# Patient Record
Sex: Male | Born: 1940
Health system: Southern US, Community
[De-identification: ages and names within clinical notes are randomized; demographics above are authoritative.]

## PROBLEM LIST (undated history)

## (undated) DIAGNOSIS — N189 Chronic kidney disease, unspecified: Secondary | ICD-10-CM

## (undated) DIAGNOSIS — E785 Hyperlipidemia, unspecified: Secondary | ICD-10-CM

## (undated) DIAGNOSIS — C61 Malignant neoplasm of prostate: Secondary | ICD-10-CM

## (undated) DIAGNOSIS — E119 Type 2 diabetes mellitus without complications: Secondary | ICD-10-CM

## (undated) DIAGNOSIS — M109 Gout, unspecified: Secondary | ICD-10-CM

## (undated) DIAGNOSIS — I10 Essential (primary) hypertension: Secondary | ICD-10-CM

## (undated) DIAGNOSIS — I2510 Atherosclerotic heart disease of native coronary artery without angina pectoris: Secondary | ICD-10-CM

## (undated) HISTORY — DX: Atherosclerotic heart disease of native coronary artery without angina pectoris: I25.10

## (undated) HISTORY — DX: Chronic kidney disease, unspecified: N18.9

## (undated) HISTORY — DX: Gout, unspecified: M10.9

## (undated) HISTORY — PX: TRANSPERINEAL IMPLANT OF RADIATION SEEDS W/ ULTRASOUND: SUR1381

## (undated) HISTORY — DX: Hyperlipidemia, unspecified: E78.5

## (undated) HISTORY — DX: Essential (primary) hypertension: I10

## (undated) HISTORY — DX: Type 2 diabetes mellitus without complications: E11.9

---

## 2001-02-28 ENCOUNTER — Ambulatory Visit (HOSPITAL_COMMUNITY): Admission: RE | Admit: 2001-02-28 | Discharge: 2001-02-28 | Payer: Self-pay | Admitting: Family Medicine

## 2001-02-28 ENCOUNTER — Encounter: Payer: Self-pay | Admitting: Family Medicine

## 2001-04-06 ENCOUNTER — Encounter: Payer: Self-pay | Admitting: Neurology

## 2001-04-06 ENCOUNTER — Encounter: Admission: RE | Admit: 2001-04-06 | Discharge: 2001-04-06 | Payer: Self-pay | Admitting: Neurology

## 2002-07-05 ENCOUNTER — Ambulatory Visit (HOSPITAL_COMMUNITY): Admission: RE | Admit: 2002-07-05 | Discharge: 2002-07-05 | Payer: Self-pay | Admitting: Family Medicine

## 2002-08-21 ENCOUNTER — Encounter: Payer: Self-pay | Admitting: Urology

## 2002-08-21 ENCOUNTER — Encounter: Admission: RE | Admit: 2002-08-21 | Discharge: 2002-08-21 | Payer: Self-pay | Admitting: Urology

## 2003-10-23 HISTORY — PX: CARDIOVASCULAR STRESS TEST: SHX262

## 2003-12-26 ENCOUNTER — Ambulatory Visit (HOSPITAL_COMMUNITY): Admission: RE | Admit: 2003-12-26 | Discharge: 2003-12-26 | Payer: Self-pay | Admitting: Gastroenterology

## 2004-01-09 ENCOUNTER — Ambulatory Visit (HOSPITAL_COMMUNITY): Admission: RE | Admit: 2004-01-09 | Discharge: 2004-01-09 | Payer: Self-pay | Admitting: Gastroenterology

## 2004-01-14 ENCOUNTER — Encounter: Admission: RE | Admit: 2004-01-14 | Discharge: 2004-01-14 | Payer: Self-pay | Admitting: Cardiology

## 2004-01-16 ENCOUNTER — Ambulatory Visit (HOSPITAL_COMMUNITY): Admission: RE | Admit: 2004-01-16 | Discharge: 2004-01-16 | Payer: Self-pay | Admitting: Cardiology

## 2004-01-16 HISTORY — PX: CARDIAC CATHETERIZATION: SHX172

## 2004-02-05 ENCOUNTER — Inpatient Hospital Stay (HOSPITAL_COMMUNITY): Admission: RE | Admit: 2004-02-05 | Discharge: 2004-02-10 | Payer: Self-pay | Admitting: Surgery

## 2004-02-05 HISTORY — PX: CORONARY ARTERY BYPASS GRAFT: SHX141

## 2004-03-02 ENCOUNTER — Encounter: Admission: RE | Admit: 2004-03-02 | Discharge: 2004-03-02 | Payer: Self-pay | Admitting: Surgery

## 2004-03-08 ENCOUNTER — Encounter (HOSPITAL_COMMUNITY): Admission: RE | Admit: 2004-03-08 | Discharge: 2004-04-16 | Payer: Self-pay | Admitting: Cardiology

## 2004-03-15 ENCOUNTER — Encounter: Admission: RE | Admit: 2004-03-15 | Discharge: 2004-06-13 | Payer: Self-pay | Admitting: Surgery

## 2004-04-28 ENCOUNTER — Encounter (HOSPITAL_COMMUNITY): Admission: RE | Admit: 2004-04-28 | Discharge: 2004-07-27 | Payer: Self-pay | Admitting: Cardiology

## 2004-08-09 HISTORY — PX: CARDIOVASCULAR STRESS TEST: SHX262

## 2004-08-24 ENCOUNTER — Encounter (HOSPITAL_COMMUNITY): Admission: RE | Admit: 2004-08-24 | Discharge: 2004-11-22 | Payer: Self-pay | Admitting: Cardiology

## 2004-11-23 ENCOUNTER — Encounter (HOSPITAL_COMMUNITY): Admission: RE | Admit: 2004-11-23 | Discharge: 2005-02-21 | Payer: Self-pay | Admitting: Cardiology

## 2005-02-22 ENCOUNTER — Encounter (HOSPITAL_COMMUNITY): Admission: RE | Admit: 2005-02-22 | Discharge: 2005-05-23 | Payer: Self-pay | Admitting: Cardiology

## 2005-08-08 IMAGING — CR DG CHEST 2V
2 series · 2 of 2 positions shown · non-contrast
Comparison: none

CLINICAL DATA: Hypertension.  Pre-cardiac cath.
 TWO VIEW CHEST   
 Two view chest with no priors.
 Heart size and contour are within normal limits.  Slight peribronchial thickening.  No congestive heart failure or active disease.
 IMPRESSION
 No active disease.

[view not recorded (1 of 2)]
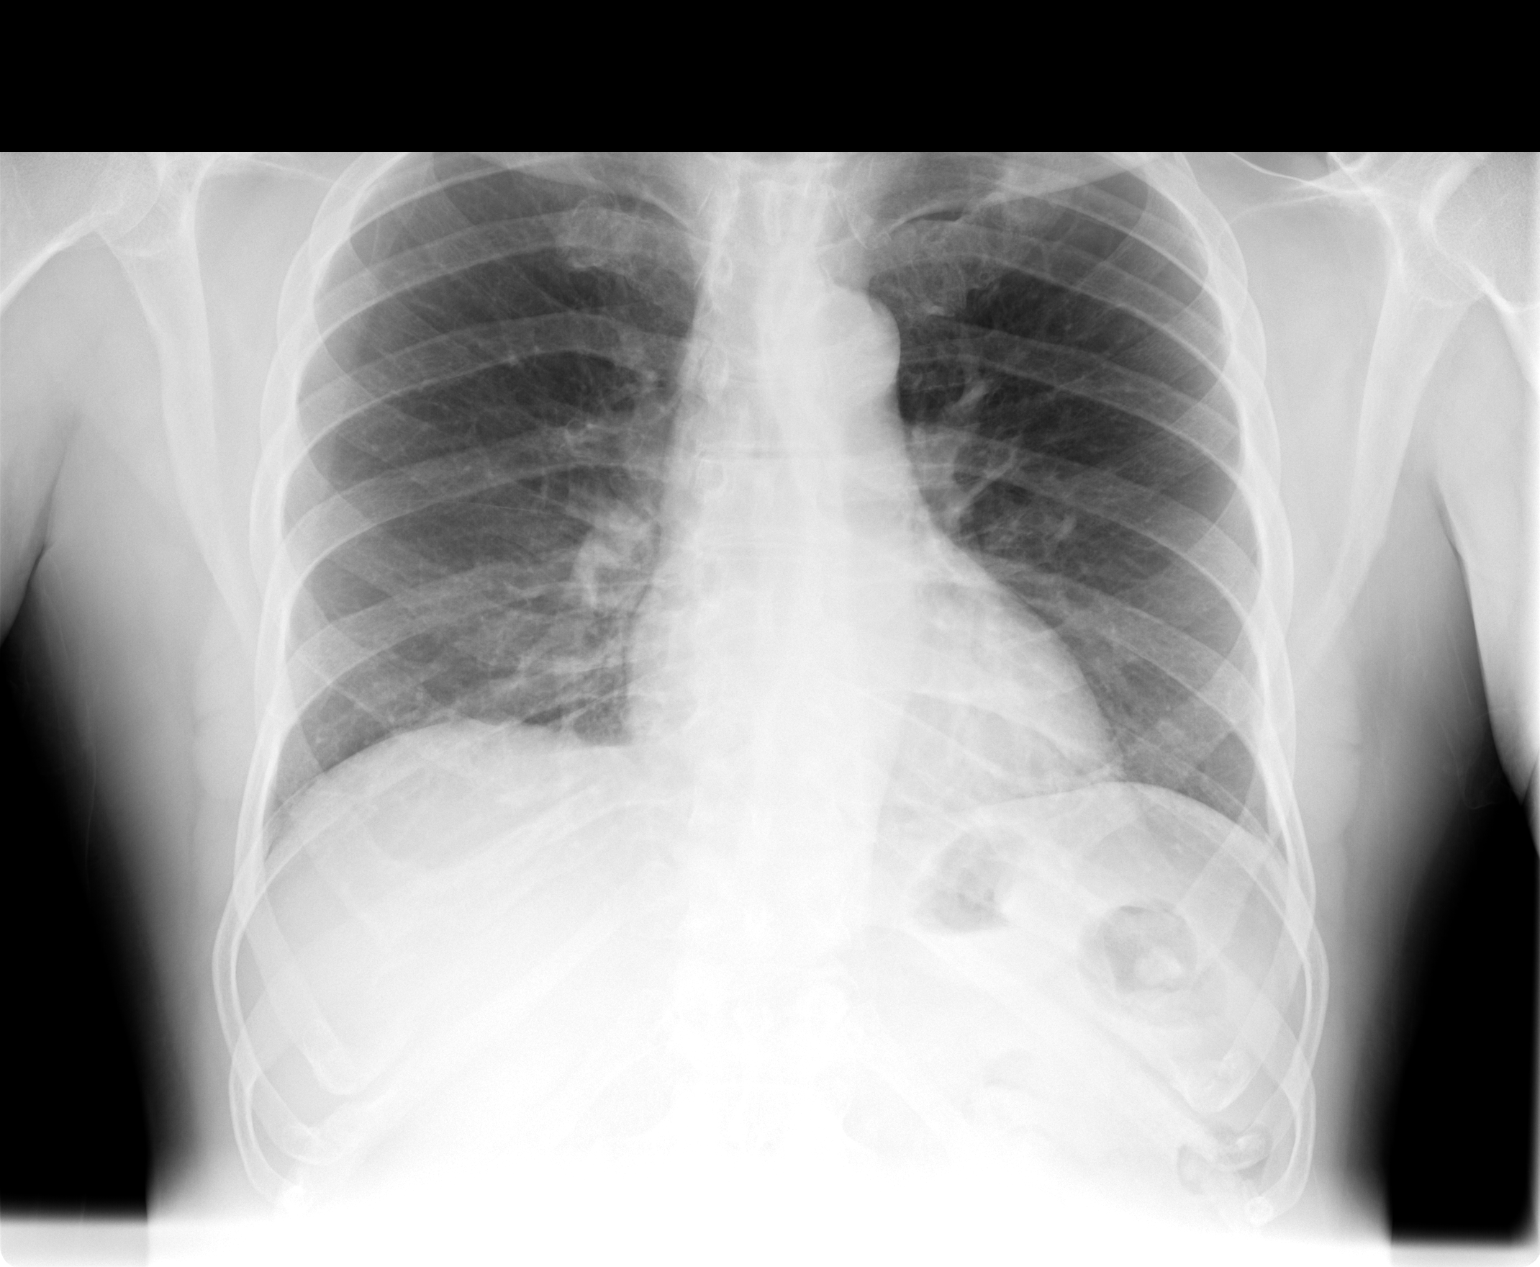

[view not recorded (2 of 2)]
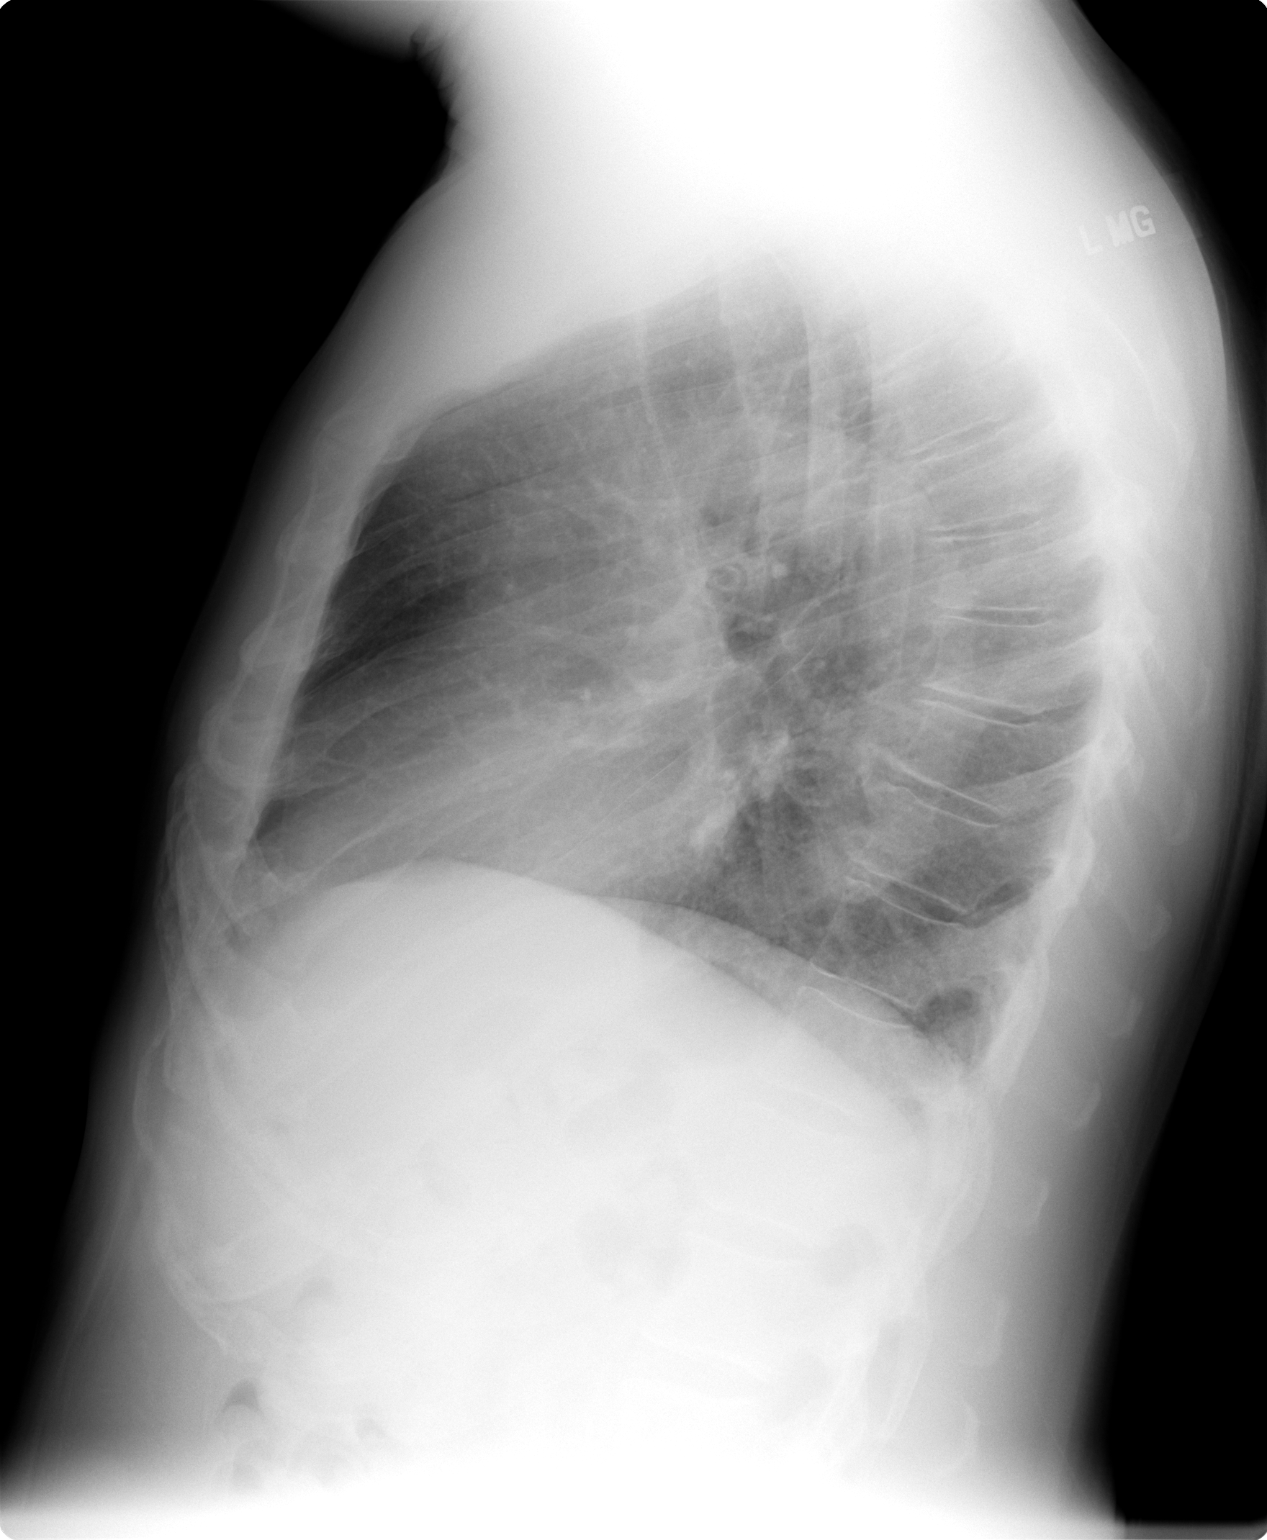

[2 of 2 positions shown; findings below may reference images not displayed]

## 2005-09-01 IMAGING — CR DG CHEST 1V PORT
1 series · 1 of 1 positions shown · non-contrast
Comparison: none

CLINICAL DATA: 63-year-old male.  Coronary artery disease status post CABG.
 PORTABLE CHEST, SINGLE VIEW
 Comparison 02/06/04.
 Portable chest radiograph 02/07/04 1980 hours.
 The Swan-Ganz catheter has been removed.  Right IJ introducer sheath remains.  Mediastinal drain and left chest tube have also been removed.  Epicardial pacing wires remain.  The patient has had a median sternotomy.  There is slight increasing bibasilar atelectasis and decreased lung volumes.  No pneumothorax.
 IMPRESSION 
 Lower lung volumes with increasing basilar atelectasis.

[view not recorded]
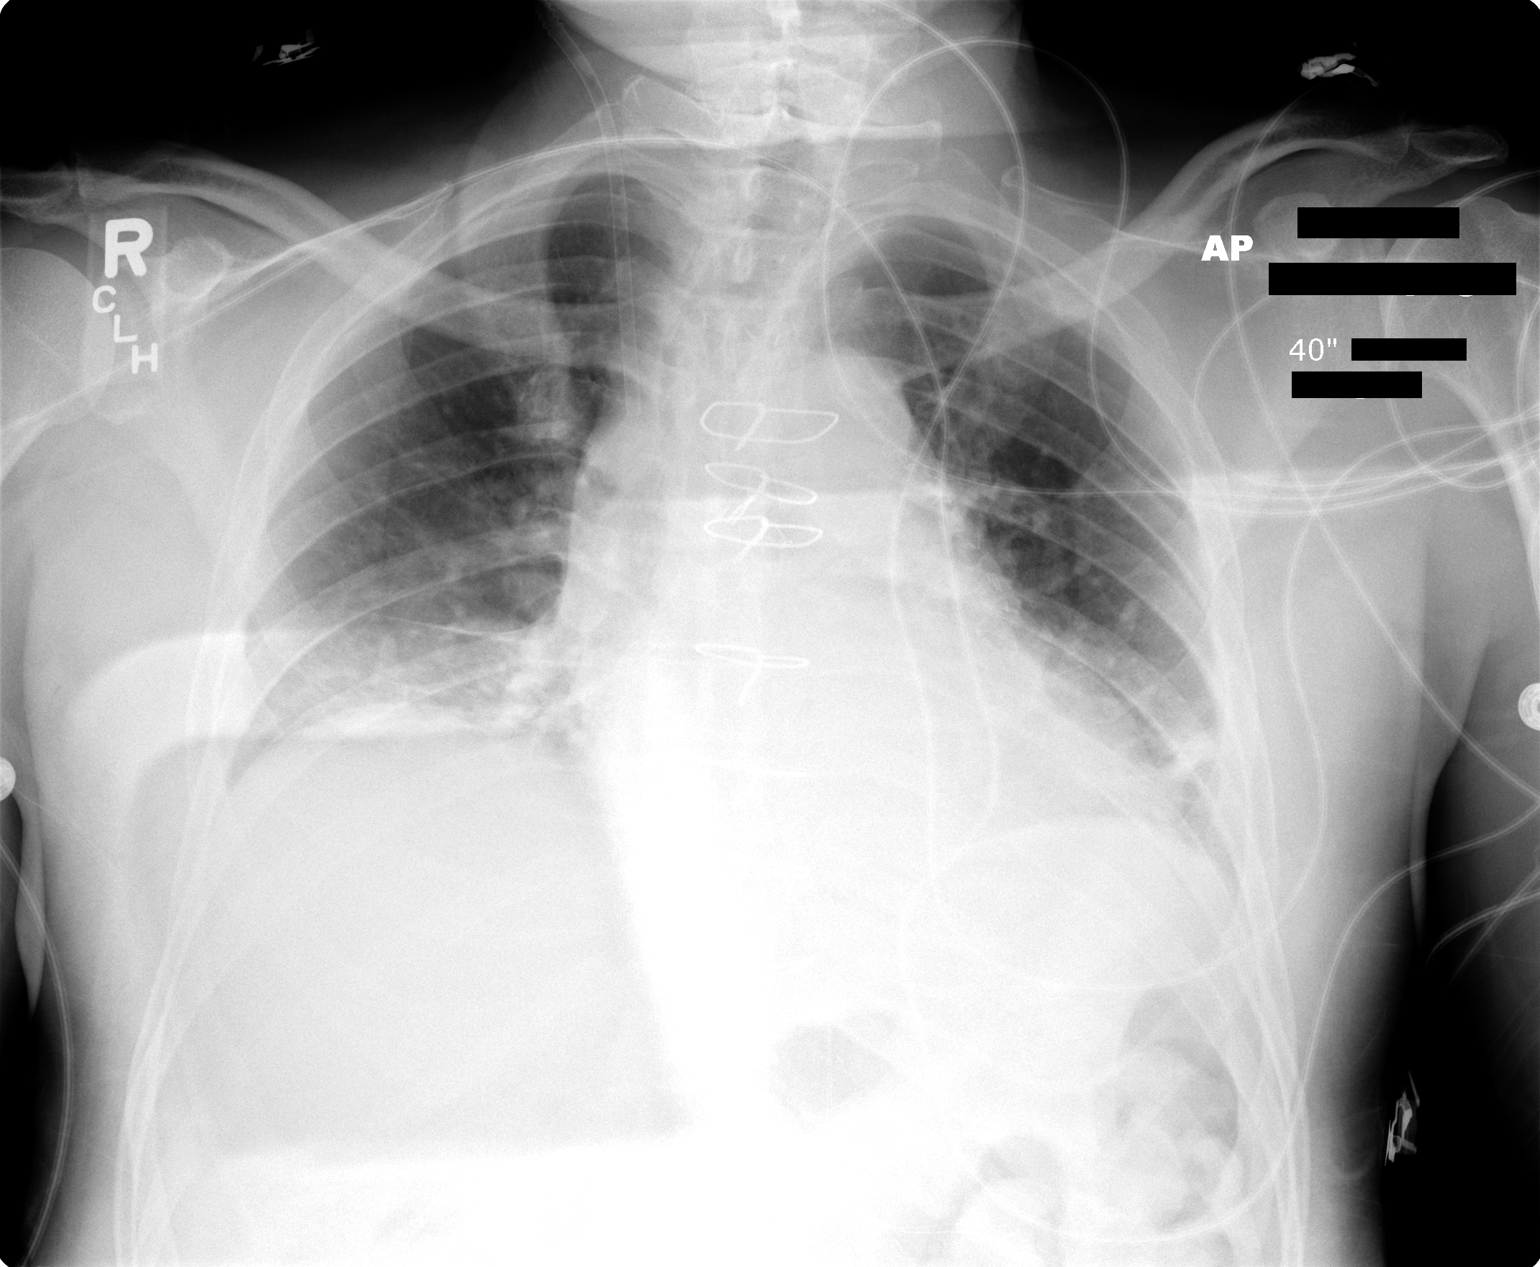

[1 of 1 positions shown; findings below may reference images not displayed]

## 2009-08-05 HISTORY — PX: NM MYOCAR PERF WALL MOTION: HXRAD629

## 2010-02-04 ENCOUNTER — Ambulatory Visit
Admission: RE | Admit: 2010-02-04 | Discharge: 2010-05-05 | Payer: Self-pay | Source: Home / Self Care | Admitting: Radiation Oncology

## 2010-02-26 ENCOUNTER — Encounter: Admission: RE | Admit: 2010-02-26 | Discharge: 2010-02-26 | Payer: Self-pay | Admitting: Urology

## 2010-03-26 ENCOUNTER — Ambulatory Visit (HOSPITAL_BASED_OUTPATIENT_CLINIC_OR_DEPARTMENT_OTHER): Admission: RE | Admit: 2010-03-26 | Discharge: 2010-03-26 | Payer: Self-pay | Admitting: Urology

## 2010-06-02 ENCOUNTER — Ambulatory Visit: Admission: RE | Admit: 2010-06-02 | Discharge: 2010-06-11 | Payer: Self-pay | Admitting: Radiation Oncology

## 2011-02-08 LAB — CBC
HCT: 39.4 % (ref 39.0–52.0)
Hemoglobin: 13 g/dL (ref 13.0–17.0)
MCHC: 32.9 g/dL (ref 30.0–36.0)
MCV: 98.3 fL (ref 78.0–100.0)
Platelets: 186 10*3/uL (ref 150–400)
RBC: 4.01 MIL/uL — ABNORMAL LOW (ref 4.22–5.81)
RDW: 13.6 % (ref 11.5–15.5)
WBC: 4.5 10*3/uL (ref 4.0–10.5)

## 2011-02-08 LAB — COMPREHENSIVE METABOLIC PANEL
ALT: 20 U/L (ref 0–53)
AST: 21 U/L (ref 0–37)
Albumin: 4 g/dL (ref 3.5–5.2)
Alkaline Phosphatase: 47 U/L (ref 39–117)
BUN: 27 mg/dL — ABNORMAL HIGH (ref 6–23)
CO2: 29 mEq/L (ref 19–32)
Calcium: 9.4 mg/dL (ref 8.4–10.5)
Chloride: 108 mEq/L (ref 96–112)
Creatinine, Ser: 1.76 mg/dL — ABNORMAL HIGH (ref 0.4–1.5)
GFR calc Af Amer: 47 mL/min — ABNORMAL LOW (ref >60)
GFR calc non Af Amer: 39 mL/min — ABNORMAL LOW (ref >60)
Glucose, Bld: 166 mg/dL — ABNORMAL HIGH (ref 70–99)
Potassium: 5.1 mEq/L (ref 3.5–5.1)
Sodium: 141 mEq/L (ref 135–145)
Total Bilirubin: 0.7 mg/dL (ref 0.3–1.2)
Total Protein: 6.9 g/dL (ref 6.0–8.3)

## 2011-02-08 LAB — GLUCOSE, CAPILLARY
Glucose-Capillary: 112 mg/dL — ABNORMAL HIGH (ref 70–99)
Glucose-Capillary: 150 mg/dL — ABNORMAL HIGH (ref 70–99)

## 2011-02-08 LAB — PROTIME-INR
INR: 0.98 (ref 0.00–1.49)
Prothrombin Time: 12.9 seconds (ref 11.6–15.2)

## 2011-02-08 LAB — APTT: aPTT: 31 seconds (ref 24–37)

## 2011-04-08 NOTE — Discharge Summary (Signed)
NAMEJAYVEON, CONVEY NO.:  [id number]   MEDICAL RECORD NO.:  [id number]                   PATIENT TYPE:  INP   LOCATION:  2034                                 FACILITY:  [facility]   PHYSICIAN:  Evelene Croon, M.D.                  DATE OF BIRTH:  01/17/1941   DATE OF ADMISSION:  02/05/2004  DATE OF DISCHARGE:  02/10/2004                                 DISCHARGE SUMMARY   PHYSICIANS:  1. Primary care, Robyn N. Allyne Gee, M.D.  2. Cardiologist, Madaline Savage, M.D.   ADMISSION DIAGNOSIS:  Severe three-vessel coronary artery disease.   DISCHARGE DIAGNOSES AND SECONDARY DIAGNOSES:  1. Three-vessel coronary artery disease status post coronary artery bypass     grafting.  2. Diabetes mellitus, type 2.  Recent hemoglobin A1C was about 10.  3. History of renal insufficiency with creatinine of 1.9, stable throughout     hospitalization.  4. History of degenerative arthritis of the spine with pain in his legs.  5. Postoperative hypotension, controlled on Neo-Synephrine, now weaned.     Initiation of postoperative beta blocker held secondary to hypotension.   PROCEDURES:  On February 05, 2004, Mr. Aloia underwent median sternotomy,  extracorporeal circulation, coronary artery bypass grafting surgery x 3  using a left internal mammary artery graft to the left anterior descending  coronary artery with a saphenous vein graft to the diagonal branch of the  left anterior descending  and a saphenous vein graft to the obtuse marginal  branch of the left circumflex coronary artery, and endoscopic vein  harvesting from the left thigh.  Surgeon was Dr. Evelene Croon.   ALLERGIES:  No known drug allergies.   BRIEF HISTORY:  Mr. Manninen is a 70 year old African-American male with a  history of smoking, hypercholesterolemia, and diabetes, who presented with  symptoms of exertional fatigue.  Cardiac catheterization showed about 40 to  50% ostial left main stenosis.  The LAD  was occluded at its origin.  It  filled from collaterals from a large conus branch of the right coronary  artery.  The right coronary artery was a nondominant vessel that had no  significant stenosis.  The left circumflex gave off a large first marginal  branch that had hazy 70% proximal stenosis in it.  The remainder of the left  circumflex terminated at a small posterolateral and posterior descending  branches, none of which were of graftable size and were relatively short.  Left ventricular function was well preserved.  Based on these findings, the  patient was referred to Dr. Evelene Croon for consideration of surgical  revascularization.  After review of the angiogram and examination of the  patient, Dr. Laneta Simmers did feel that coronary artery bypass grafting surgery  was the best treatment to prevent further ischemia and infarction.  After  Dr. Laneta Simmers discussed the risks, benefits, and alternatives with Mr. Clauss,  the patient  did agree to proceed.  His surgery was subsequently scheduled  for February 05, 2004.   HOSPITAL COURSE:  On February 05, 2004, Mr. Copeman was electively admitted to  [facility] for coronary artery disease.  On that date, he did  undergo coronary artery bypass grafting surgery as discussed above.  The  patient tolerated the procedure well and was transferred from the operating  room to the surgical intensive care unit in stable condition.  Later that  evening, he was waking up but still on the ventilator.  He was in normal  sinus rhythm with atrial pacing.  His blood pressure was stable on Neo-  Synephrine.  His labs and urine output were also stable.   By the next morning, he had bene extubated neurologically intact.  He  remained hemodynamically stable and fully atrial passed on Neo-Synephrine  for postoperative vasodilation.  His chest tube output was minimal and was,  therefore, discontinued without difficulty.  His EKG showed no significant  ST changes;  however, the right bundle branch block was noted.  His lungs  were clear, and his chest x-ray was stable.   Over the next several days, Mr. Braithwaite continued to progress.  By  postoperative day #2, he wsz in sinus rhythm and no longer atrial paced.  He  remained afebrile.  His bowel and bladder functions also working  appropriately.  His incisions were clean and dry without evidence of  infection.  He was also ambulating independently with supervision.  His pain  was also controlled on oral medication.  He was saturating above 90% on room  air.  His primary issue was hypotension, and he did require Neo-Synephrine  drip through postoperative day #2.  However, once weaned, his blood pressure  did remain low/normal.  Based on this fact, postoperative beta blocker  therapy was not initiated.   By postoperative day #4, Mr. Ernsberger was transferred out of the surgical  intensive care unit onto the floor, unit 2000.  His blood pressure remained  stable at 105/55.  He remained afebrile in sinus rhythm with good oxygen  saturations.  By this time, his Amaryl had been restarted for diabetes, and  blood sugars were ranging approximately 110 to 200.  His Avandamet was held  until discharge.  The patient was seen by the diabetes educator while  hospitalized, and an outpatient followup appointment was arranged four to  six weeks after discharge with nutrition and diabetes management center.  Mr. Villalon was also restarted on his cholesterol-lowering agents.   Due to Mr. Viverette' steady progression, Dr. Laneta Simmers did feel he would be ready  for discharge on postoperative day #5, February 10, 2004.  The patient was also  followed by his cardiologist during hospitalization who also was in  agreement with these discharge plans.  Official orders will be pending  patient's status during morning rounds.  LABORATORY AND X-RAY DATA:  On February 08, 2004, sodium 136, potassium 3.7,  blood glucose 84, BUN 13, creatinine 1.5,  calcium 8l.4.  White blood count  7.9, hemoglobin 10.1, hematocrit 28.8, platelet count 133.   Most recent chest x-ray on February 08, 2004, showed improving bibasilar  atelectasis with small pleural effusions.   DISCHARGE INSTRUCTIONS:   DISCHARGE MEDICATIONS:  1. Enteric-coated aspirin 325 mg 1 p.o. daily.  2. Lipitor 20 mg 1 p.o. daily.  3. Amaryl 4 mg 1 p.o. b.i.d. with meals.  4. Avandamet 1 mg/500 mg 2 tablets t.i.d. with meals.  5. Tylox  1 to 2 tablets p.o. 4-6h. p.r.n. pain.   ACTIVITY:  He is instructed to avoid driving or heavy lifting of more than  10 pounds.  He may continue daily walking and breathing exercises.   DIET:  He is to follow low-fat, low-salt, carbohydrate-modified diet.   WOUND CARE:  He may shower daily and clean his incisions with mild soap and  water.  He is to notify the CVTS office if he develops fever or if he  develops redness or drainage at his incision sites.   FOLLOW UP:  1. He is to follow up with Dr. Laneta Simmers on March 02, 2004, at 1:30 p.m. He is     to have a chest x-ray at the [facility] that same day     at 12:30 p.m.  He is instructed to bring his chest x-ray with him to his     appointment with Dr. Laneta Simmers.  2. He is to schedule two-week followup with Dr. Elsie Lincoln.  3. He is to schedule two to four-week followup with Dr. Allyne Gee regarding     further management of diabetes.  4. Nutrition and diabetes management center will contact him in four to six     weeks to arrange for outpatient diabetes education.      Jerold Coombe, P.A.                  Evelene Croon, M.D.    AWZ/MEDQ  D:  02/09/2004  T:  02/11/2004  Job:  [id number]   cc:   Candyce Churn. Allyne Gee, M.D.  [address]  [address]  [place]  [place] [address]  Fax: [phone]   Madaline Savage, M.D.  [phone] [address], [address]  [place]  [place] [address]  Fax: [phone]

## 2011-04-08 NOTE — Cardiovascular Report (Signed)
NAMEMUHAMED, LUECKE NO.:  [id number]   MEDICAL RECORD NO.:  [id number]                   PATIENT TYPE:  OIB   LOCATION:  2853                                 FACILITY:  [facility]   PHYSICIAN:  Madaline Savage, M.D.             DATE OF BIRTH:  Aug 09, 1941   DATE OF PROCEDURE:  01/16/2004  DATE OF DISCHARGE:  01/16/2004                              CARDIAC CATHETERIZATION   PROCEDURES PERFORMED:  1. Outpatient cardiac catheterization consisting of:     A. Selective coronary angiography by Judkins technique.     B. Retrograde left heart catheterization.     C. Left ventricular angiography.     D. Selective visualization of left subclavian and internal mammary        arteries.  2. Left ventricular angiography in two views.  3. Abdominal aortography.   COMPLICATIONS:  None.   ENTRY SITE:  Right femoral.   DYE USED:  Omnipaque.   PATIENT PROFILE:  The patient is a 70 year old African-American gentleman  who is diabetic who recently had a Cardiolite stress test for assessment of  LV systolic function and anatomy in a patient with a history of tobacco use,  diabetes, hypertension and hyperlipidemia.  That stress test showed that the  ejection fraction was 43% and that there was septal ischemia extending from  base to apex.  The patient's creatinine is elevated at 1.6.  He was given  Mucomyst pre procedure.  Our dye load today was approximately 175 mL.   RESULTS:   PRESSURES:  Left ventricular pressure was 115/1, end-diastolic pressure 10.  Central aortic pressure 115/90, mean of 85.   ANGIOGRAPHIC RESULTS:  1. The left main coronary artery showed damping of catheter pressure when     entered.  In retrospect, there was noted to be a 50% stenosis in the     proximal portion of the left main which was then successfully visualized     with angiography with the repositioning of the catheter.  The mid and     distal portions of the left main coronary  artery appeared     angiographically patent.  The left anterior descending coronary artery is     thread-like throughout its proximal course.  The vessel has no antegrade     flow beyond the septal perforator branch.  2. The left circumflex is a large and dominant vessel giving rise to both     posterior descending and posterior lateral branches distally.  There is a     huge bifurcating very proximal obtuse marginal branch arising almost     where a ramus intermedius branch would originate and it contains a 75-80%     stenosis at the proximal and ostial portions of the vessel.  The distal     vessel is approaching 3.0 mm in diameter and bifurcates toward the     cardiac apex.  3. The right  coronary artery is a nondominant vessel with a very large     pulmonary conus branch and there is only trivial disease seen in the     right coronary artery.  The pulmonary conus branch has at least four     channels of collateral flow to the LAD filling it from near the origin of     the first septal perforator branch all the way to the apex and around the     apex.  After intracoronary nitroglycerin, this LAD vessel did show     improvement of luminal diameter and does appear to be graftable.  4. The left subclavian artery is patent.  5. The internal mammary artery is patent as well.  6. The abdominal aorta shows a relatively smooth contour.  Definitely, no     evidence of aneurysm formation and normal common iliacs and normal     proximal internal and external iliacs.  The renal arteries are normal.  7. Left ventricular angiography shows mild anterolateral wall ischemia with     an ejection fraction estimate of about 50% in the RAO view.  In the LAO     view, the septum moves vigorously as does the apex and the posterior     lateral wall moves well.  Ejection fraction in this view is approximately     55-65%.   FINAL DIAGNOSES:  1. No chest pain, but coronary risk factors including diabetes,      hyperlipidemia, tobacco use and hypertension.  2. Good left ventricular systolic function.  Ejection fraction somewhere     between 50 and 65% depending on the view of ventricle.  3. Multi-vessel coronary disease.     A. 40-50% left main stenosis.     B. 100% occlusion of mid left anterior descending in a fairly thread-like        vessel proximally.  4. Dominant circumflex coronary artery that is normal.  5. First obtuse marginal branch 75-80% ostial stenosis with good distal run     off.  6. Nondominant right coronary artery with trivial disease, but rich     collateralization from the pulmonary conus branch to left anterior     descending.  7. Normal renal arteries.  8. Baseline renal insufficiency, probably due to diabetic nephropathy.   PLAN:  Continue Mucomyst and IV fluid.  Day surgery or outpatient evaluation  by [facility].  Continuation of previous  outpatient medications.                                               Madaline Savage, M.D.    WHG/MEDQ  D:  01/16/2004  T:  01/17/2004  Job:  [id number]   cc:   Candyce Churn. Allyne Gee, M.D.  [address]  [address]  [place]  [place] [address]  Fax: [phone]   [facility] [place]

## 2011-04-08 NOTE — Op Note (Signed)
NAME:  Jose Ponce, Jose Ponce                          ACCOUNT NO.:  [id number]   MEDICAL RECORD NO.:  [id number]                   PATIENT TYPE:  AMB   LOCATION:  ENDO                                 FACILITY:  [facility]   PHYSICIAN:  Anselmo Rod, M.D.               DATE OF BIRTH:  [birth year]   DATE OF PROCEDURE:  12/26/2003  DATE OF DISCHARGE:                                 OPERATIVE REPORT   PROCEDURE:  Colonoscopy with snare polypectomy x 1.   ENDOSCOPIST:  Charna Elizabeth, M.D.   INSTRUMENT USED:  Olympus video colonoscope.   INDICATIONS FOR PROCEDURE:  70 year old African American male with a family  history of colon cancer in his mother undergoing screening colonoscopy to  rule out colonic polyps, masses, etc.   PREPROCEDURE PREPARATION:  Informed consent was procured from the patient.  The patient was fasted for eight hours prior to the procedure and prepped  with a bottle of magnesium citrate and a gallon of GoLYTELY the night prior  to the procedure.   PREPROCEDURE PHYSICAL:  Patient with stable vital signs.  Neck supple.  Chest clear to auscultation.  Abdomen soft with normal bowel sounds.   DESCRIPTION OF PROCEDURE:  The patient was placed in the left lateral  decubitus position, sedated with 100 mg of Demerol and 12 mg Versed  intravenously.  Once the patient was adequately sedated, maintained on low  flow oxygen and continuous cardiac monitoring, the Olympus video colonoscope  was advanced from the rectum to the proximal right colon with difficulty.  The cecal base was not visualized as there was a large amount of residual  stool in the cecum.  The patient's position was changed from the left  lateral to the supine and the right lateral position with gentle application  of abdominal pressure to facilitate entry of the scope into the right colon.  The prep was inadequate.  There was a large amount of residual stool in the  colon, therefore, visualization was not complete.  A  small sessile polyp was  snared from the distal right colon.  Internal hemorrhoids were seen on  retroflexion.  There was no evidence of diverticulosis, but as I mentioned  above, examination was not adequate.   IMPRESSION:  1. Small sessile polyp snared from proximal right colon, polyp lost in     stool.  2. Internal hemorrhoids.  3. Large amount of residual stool in the colon, small lesions could have     been missed.   RECOMMENDATIONS:  1. ECBE after 15 days.  2. Outpatient follow up after the barium enema has been done.  3. Avoid nonsteroidals for now.  4. Further recommendations made at follow up.  Anselmo Rod, M.D.    JNM/MEDQ  D:  12/26/2003  T:  12/26/2003  Job:  [id number]   cc:   Candyce Churn. Allyne Gee, M.D.  [address]  [address]  [place]  [place] [address]  Fax: [phone]

## 2011-04-08 NOTE — Op Note (Signed)
NAME:  Jose Ponce, Jose Ponce NO.:  [id number]   MEDICAL RECORD NO.:  [id number]                   PATIENT TYPE:  INP   LOCATION:  2316                                 FACILITY:  [facility]   PHYSICIAN:  Evelene Croon, M.D.                  DATE OF BIRTH:  October 05, 1941   DATE OF PROCEDURE:  02/05/2004  DATE OF DISCHARGE:                                 OPERATIVE REPORT   PREOPERATIVE DIAGNOSIS:  Left main and severe two vessel coronary artery  disease.   POSTOPERATIVE DIAGNOSIS:  Left main and severe two vessel coronary artery  disease.   PROCEDURE:  1. Median sternotomy.  2. Extracorporeal circulation.  3. Coronary artery bypass grafting surgery times three using a left internal     mammary artery graft to the left anterior descending coronary artery with     a saphenous vein graft to the diagonal branch of the left anterior     descending and a saphenous vein graft to the obtuse marginal branch of     the left circumflex coronary artery.  4. Endoscopic vein harvesting from the left thigh.   SURGEON:  Evelene Croon, M.D.   ASSISTANT:  Jerold Coombe, P.A.   ANESTHESIA:  General endotracheal anesthesia.   CLINICAL HISTORY:  The patient is a 70 year old gentleman with a history of  smoking, hypercholesterolemia and diabetes who presented with symptoms of  exertional fatigue.  Cardiac catheterization showed about a 40 to 50% ostial  left main stenosis.  The LAD was occluded at its origin.  It filled by  collaterals from a large conus branch of the right coronary artery.  The  right coronary artery was a non-dominant vessel that had no significant  stenosis.  The left circumflex gave off a large first marginal branch that  had hazy 70% proximal stenosis in it.  The remainder of the left circumflex  terminated as small posterolateral and posterior descending branches, none  of which were of graftable size and were relatively short.  Left ventricular  function was well preserved. After review of the angiogram and examination  of the patient, it was felt that coronary artery bypass grafting surgery was  the best treatment to prevent further ischemia and infarction.  I discussed  the operative procedure with the patient and his family including  alternatives, benefits and risks including bleeding, blood transfusion,  infection, stroke, myocardial infarction and death.  They understood and  agreed to proceed.   DESCRIPTION OF PROCEDURE:  The patient was taken to the operating room and  placed on the table in the supine position. After the induction of general  endotracheal anesthesia a Foley catheter was placed in the bladder using  sterile technique.  Then the chest, abdomen and both lower extremities were  prepped and draped in the usual sterile manner.  The chest was entered  through a median sternotomy incision and  the pericardium opened in the  midline.  Examination of the heart showed good ventricular contractility.  The ascending aorta had no palpable plaques in it.   Then the left internal mammary artery was harvested from the chest wall as a  pedicle graft.  This was a medium caliber vessel with excellent blood flow  through it.  At the same time, a segment of greater saphenous vein was  harvested from the left thigh using endoscopic vein harvest technique. This  vein was of medium size and good quality.  We initially examined the  saphenous vein adjacent to the right knee and this vein was smaller and not  felt to be ideal for this patient.  This vein was not harvested.   Then the patient was heparinized and when an adequate activated clotting  time was achieved, the distal ascending aorta was cannulated using a 20  French aortic cannula for arterial in flow.  Venous outflow was achieved  using a two stage venous cannula through the right atrial appendage.  An  antegrade cardioplegia and vent cannula were inserted into the  aortic root.   The patient was placed on cardiopulmonary bypass and the distal coronary was  identified.  The LAD was a large graftable vessel.  There was a diagonal  branch that was not visualizes on the angiogram which was a medium size  graftable vessel.  The first marginal branch was a large vessel that was  intramyocardial along most of its extent and was located in its mid portion  where it was a large graftable vessel.  The posterior descending artery and  posterolateral branches of the left circumflex coronary artery were all  small vessels that were not suitable for grafting.  The main distal left  circumflex coronary artery could not be visualized in the atrioventricular  groove because there was a large coronary sinus vein overlying this area.   Then the aorta was cross clamped and 500 mL of cold blood antegrade  cardioplegia was administered in the aortic root with quick arrest of the  heart.  Systemic hypothermia to 20 degrees Centigrade and topical  hypothermic with iced saline was used. A temperature probe was placed in the  septum and an insulating pad in the pericardium.   The first distal anastomosis was performed to the first marginal branch.  The internal diameter was about 3 mm.  The conduit used was a segment of  greater saphenous vein and the anastomosis performed in an end to side  manner using continuous 7-0 Prolene suture.  Flow was noted through the  graft and was excellent.   The second distal anastomosis was performed to the diagonal branch. The  internal diameter was about 1.6 mm.  The conduit used was a second segment  of greater saphenous vein and the anastomosis performed in an end to side  manner using continuous 7-0 Prolene suture.  Flow was noted through the  graft and was excellent.   Then a dose a cardioplegia was given down the main grafts and into the  aortic root.   The third distal anastomosis was performed to the mid portion of the  left anterior descending coronary artery.  The internal diameter of this vessel  was about 2 mm.  The conduit used was the left internal mammary artery graft  and this was brought through an opening in the left pericardium anterior to  the phrenic nerve.  It was anastomosed to the LAD in an end to side  manner  using continuous 8-0 Prolene suture.  The pedicle was tacked to the  epicardium with 6-0 Prolene sutures.  The patient was rewarmed to 37 degrees  Centigrade.  The clamp was removed from the mammary pedicle.  There was  rapid warming of the ventricular septum and return of spontaneous  ventricular fibrillation. The cross clamp was removed with a time of 41  minutes and the patient spontaneously converted to sinus rhythm.   A partial occlusion clamp was placed on the aortic root and the two proximal  vein graft anastomoses were performed in an end to side manner using  continuous 6-0 Prolene suture.  The clamp was removed, the vein grafts were  de-aired in routine manner.  The proximal and distal anastomoses appeared  hemostatic and lie in the grafts satisfactorily. A graft marker was placed  around the proximal anastomoses.  Two temporary right ventricular and right  atrial pacing wires were placed and brought out through the skin.   When the patient had rewarmed to 37 degrees Centigrade, he was weaned from  cardiopulmonary bypass on no inotropic agents.  Total bypass time was 74  minutes.  Cardiac function appeared excellent with a cardiac output of 5 to  6 liters per minute. Protamine was given and the venous and aortic cannulas  were removed without difficulty.  Hemostasis was achieved.  Three chest  tubes were placed, one to the tube in the posterior pericardium, one in the  left pleural space and one in the anterior mediastinum.  The pericardium was  loosely reapproximated over the heart.  The sternum was closed with #6  stainless steel wires.  The fascia was closed with  continuous #1 Vicryl  suture.  The subcutaneous tissue was closed with continuous 2-0 Vicryl and  the skin with 3-0 Vicryl and subcuticular closure.  The lower extremity vein  harvest site was closed in layers in a similar manner.  The sponge, needle  and instrument counts were correct.  Dry sterile dressings were applied over  the incisions and around the chest tubes which were hooked to PleuraVac  suction.  The patient remained hemodynamically stable and was transferred to  the SICU in guarded but stable condition.                                               Evelene Croon, M.D.    BB/MEDQ  D:  02/05/2004  T:  02/08/2004  Job:  [id number]   cc:   Madaline Savage, M.D.  [address]  [place]  [place] [address]  Fax: [phone]

## 2011-06-07 HISTORY — PX: NM MYOCAR PERF WALL MOTION: HXRAD629

## 2011-09-21 IMAGING — CR DG CHEST 2V
2 series · 2 of 2 positions shown · non-contrast
Comparison: 03/02/2004

CLINICAL DATA: Preop evaluation for prostate cancer

CHEST - 2 VIEW

[view not recorded (1 of 2)]
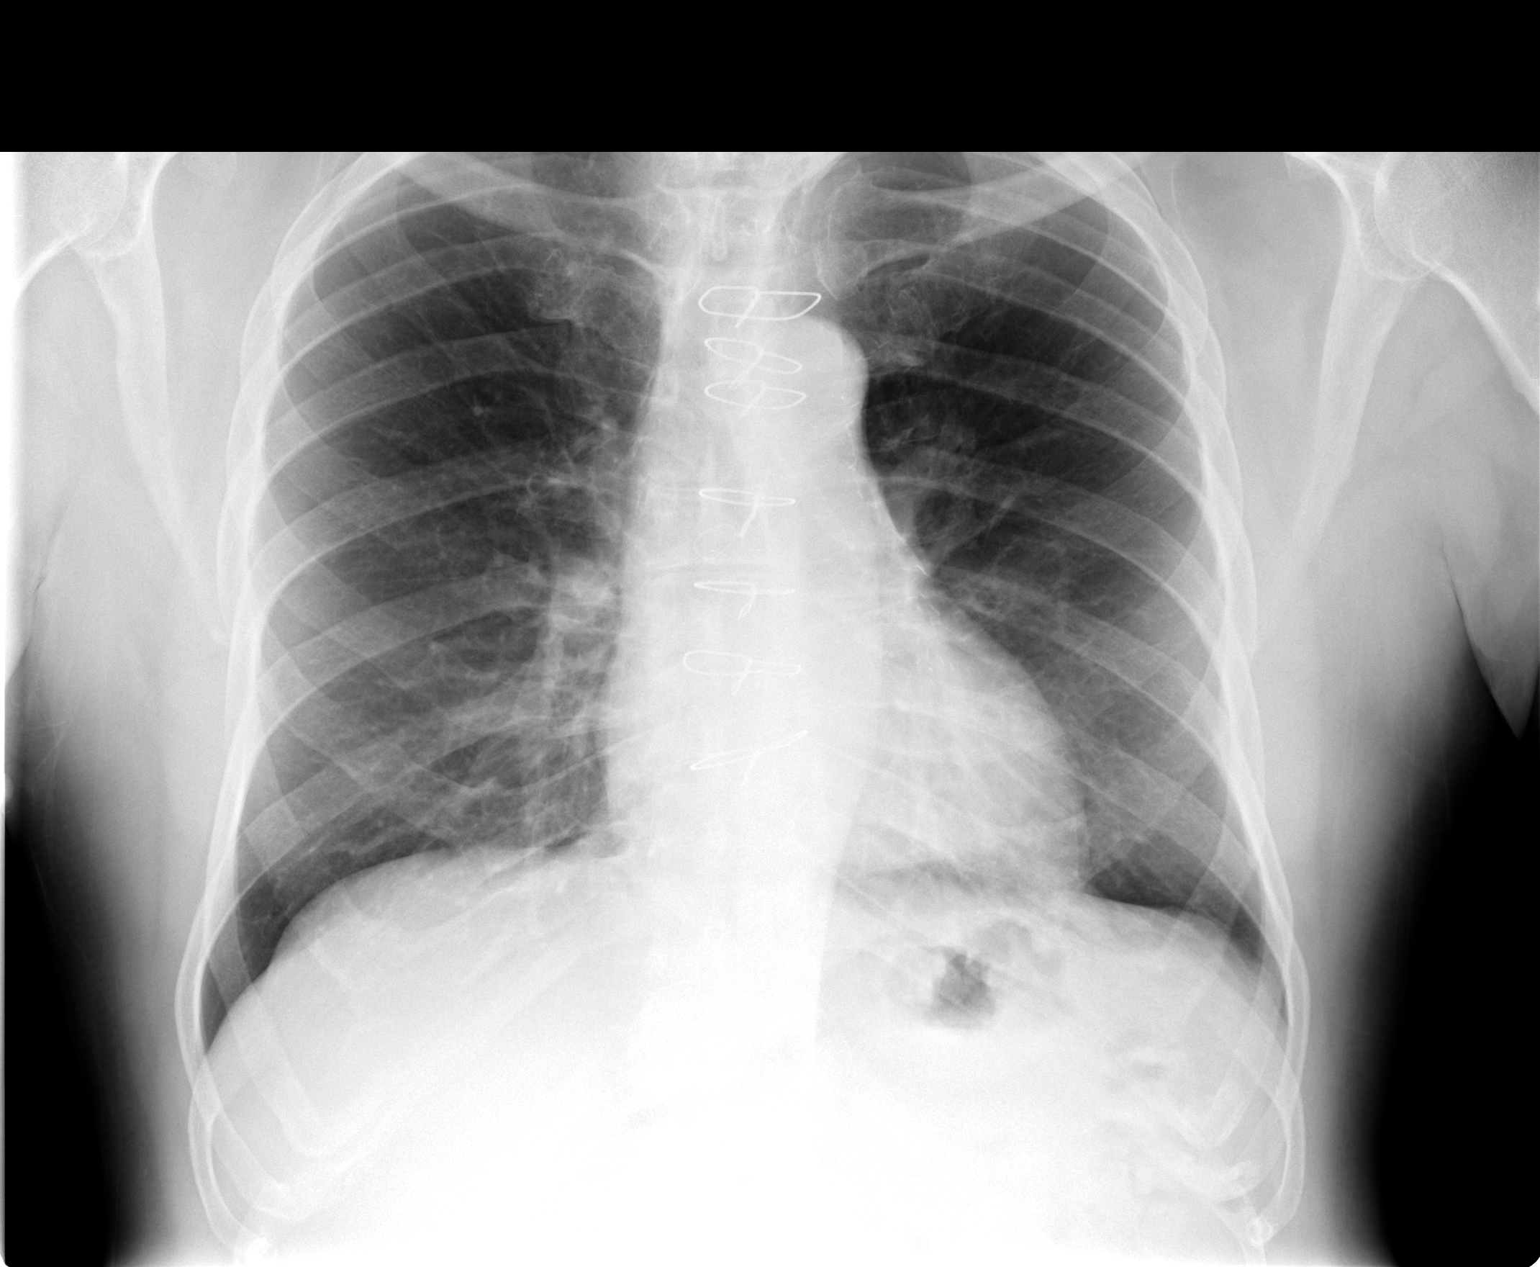

[view not recorded (2 of 2)]
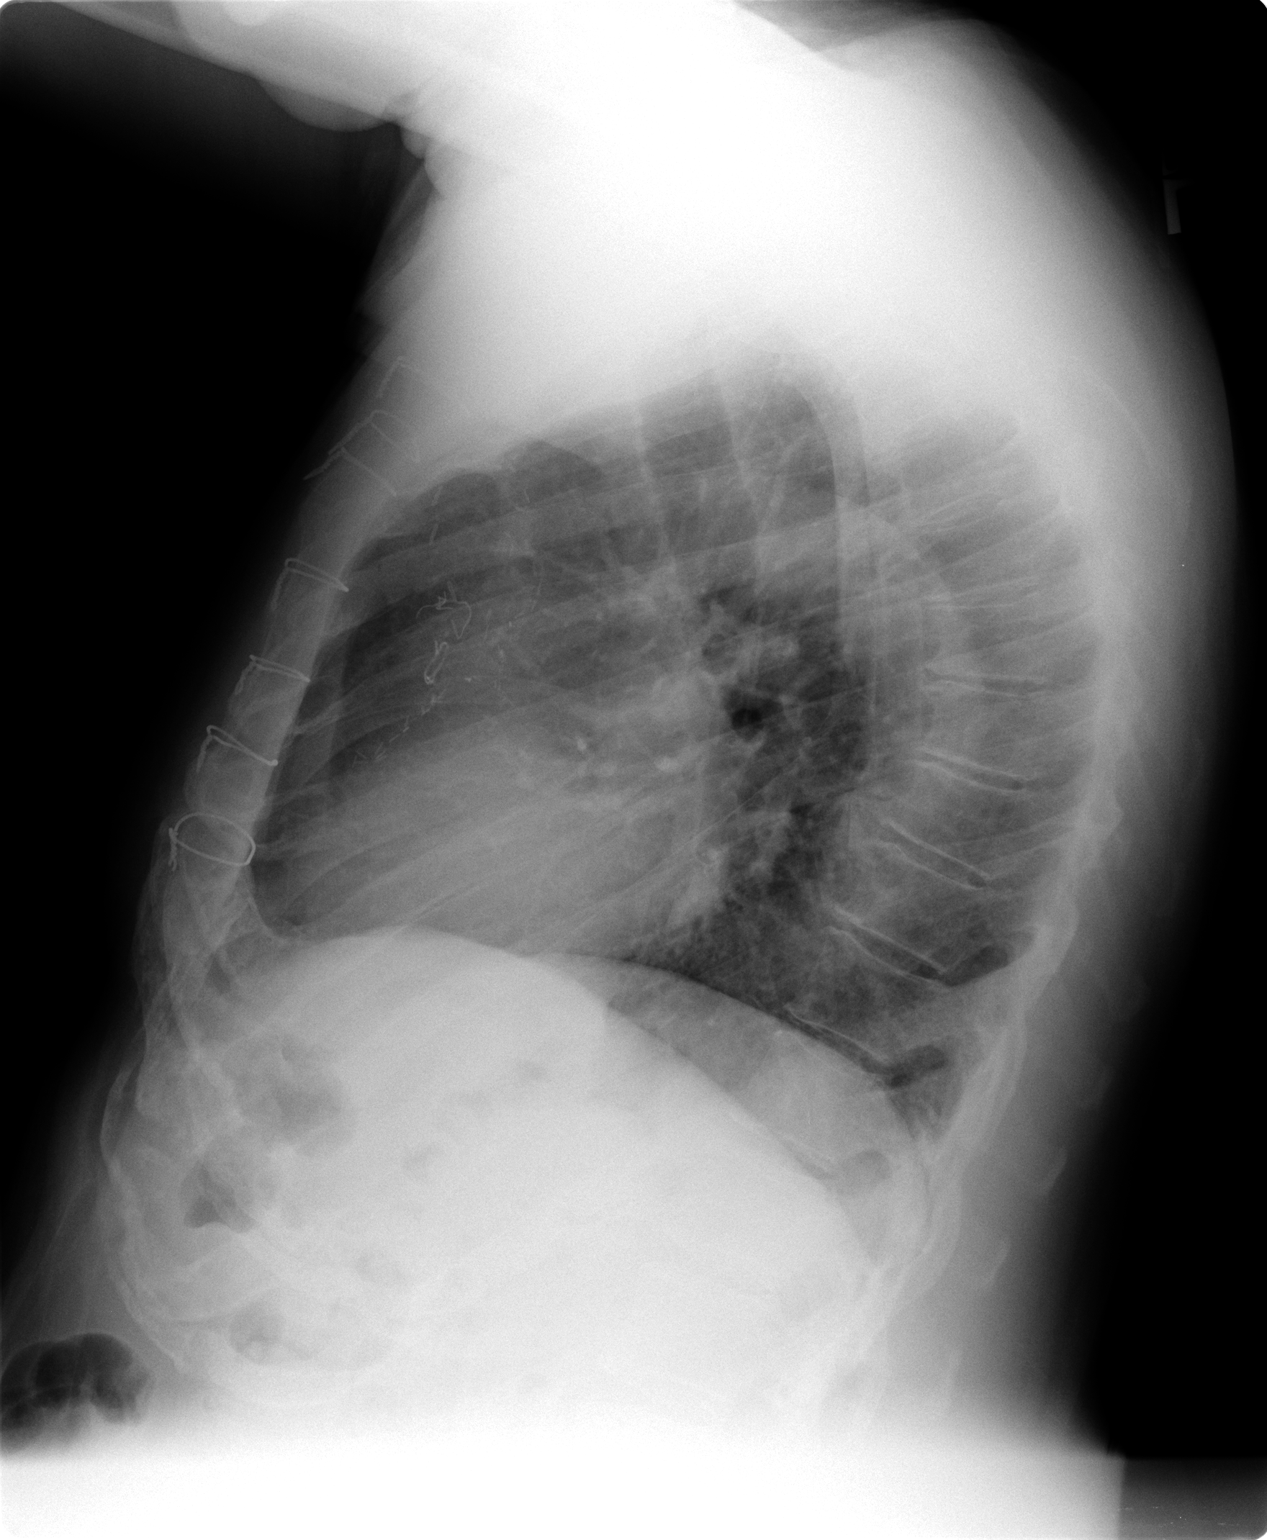

[2 of 2 positions shown; findings below may reference images not displayed]

FINDINGS: The lungs are clear without focal infiltrate, edema,
pneumothorax or pleural effusion. The cardiopericardial silhouette
is within normal limits for size. Imaged bony structures of the
thorax are intact. The patient is status post CABG.
IMPRESSION: Stable.  No acute findings.

## 2011-10-20 HISTORY — PX: OTHER SURGICAL HISTORY: SHX169

## 2014-01-30 ENCOUNTER — Encounter: Payer: Self-pay | Admitting: Cardiovascular Disease

## 2014-01-30 ENCOUNTER — Ambulatory Visit (INDEPENDENT_AMBULATORY_CARE_PROVIDER_SITE_OTHER): Payer: Medicare PPO | Admitting: Cardiovascular Disease

## 2014-01-30 VITALS — BP 112/80 | HR 50 | Ht 71.0 in | Wt 180.1 lb

## 2014-01-30 DIAGNOSIS — E1122 Type 2 diabetes mellitus with diabetic chronic kidney disease: Secondary | ICD-10-CM | POA: Insufficient documentation

## 2014-01-30 DIAGNOSIS — I10 Essential (primary) hypertension: Secondary | ICD-10-CM

## 2014-01-30 DIAGNOSIS — E119 Type 2 diabetes mellitus without complications: Secondary | ICD-10-CM

## 2014-01-30 DIAGNOSIS — I2510 Atherosclerotic heart disease of native coronary artery without angina pectoris: Secondary | ICD-10-CM

## 2014-01-30 DIAGNOSIS — E785 Hyperlipidemia, unspecified: Secondary | ICD-10-CM

## 2014-01-30 DIAGNOSIS — N1831 Chronic kidney disease, stage 3a: Secondary | ICD-10-CM | POA: Insufficient documentation

## 2014-01-30 DIAGNOSIS — N183 Chronic kidney disease, stage 3 unspecified: Secondary | ICD-10-CM | POA: Insufficient documentation

## 2014-01-30 NOTE — Assessment & Plan Note (Signed)
Status post coronary artery bypass grafting by Dr. Arvid Right 3/17/5 the LIMA to his LAD, vein to diagonal branch, obtuse marginal branch of the circumflex. He denies chest pain or shortness of breath. His last Myoview performed 06/07/11 with an ischemic.

## 2014-01-30 NOTE — Assessment & Plan Note (Signed)
Controlled on her medications 

## 2014-01-30 NOTE — Assessment & Plan Note (Signed)
On statin therapy followed by his PCP 

## 2014-01-30 NOTE — Patient Instructions (Signed)
Your physician recommends that you schedule a follow-up appointment in: 1 year  

## 2014-01-30 NOTE — Progress Notes (Signed)
01/30/2014 Jose Ponce   [birth year]  [id number]  Primary Physician Jose Greenland, MD Primary Cardiologist: Jose Harp MD Jose Ponce   HPI:  The patient is a very pleasant, 73 year old, mildly overweight, divorced [place] American male, father of 97, grandfather to 3 grandchildren whom I last saw on July 21, 2010. He has a history of CAD status post coronary artery bypass grafting x3 by Dr. Gilford Ponce on February 05, 2004. He had LIMA to his LAD, vein graft to the diagonal branch and obtuse marginal branch as well as the circumflex. His other problems include hypertension, dyslipidemia, and noninsulin-requiring diabetes. He does not smoke. He denies chest pain or shortness of breath. His last Myoview performed on June 07, 2011, was nonischemic. Dr. Baird Ponce follows his lipid profile. Since I saw him 2 years ago he's been completely asymptomatic specifically denying chest pain or shortness of breath.  Current Outpatient Prescriptions  Medication Sig Dispense Refill  . aspirin 81 MG tablet Take 81 mg by mouth daily.      . febuxostat (ULORIC) 40 MG tablet Take 40 mg by mouth daily.      . Flaxseed, Linseed, (FLAX SEEDS PO) Take 1 tablet by mouth daily.      . metoprolol succinate (TOPROL-XL) 50 MG 24 hr tablet Take 25 mg by mouth daily.      [name] olmesartan (BENICAR) 20 MG tablet Take 20 mg by mouth daily.      . rosuvastatin (CRESTOR) 10 MG tablet Take 10 mg by mouth daily.      . saxagliptin HCl (ONGLYZA) 2.5 MG TABS tablet Take 2.5 mg by mouth daily.       No current facility-administered medications for this visit.    Not on File  History   Social History  . Marital Status: Divorced    Spouse Name: N/A    Number of Children: N/A  . Years of Education: N/A   Occupational History  . Not on file.   Social History Main Topics  . Smoking status: Never Smoker   . Smokeless tobacco: Not on file  . Alcohol Use: Not on file  . Drug Use: Not on file  . Sexual  Activity: Not on file   Other Topics Concern  . Not on file   Social History Narrative  . No narrative on file     Review of Systems: General: negative for chills, fever, night sweats or weight changes.  Cardiovascular: negative for chest pain, dyspnea on exertion, edema, orthopnea, palpitations, paroxysmal nocturnal dyspnea or shortness of breath Dermatological: negative for rash Respiratory: negative for cough or wheezing Urologic: negative for hematuria Abdominal: negative for nausea, vomiting, diarrhea, bright red blood per rectum, melena, or hematemesis Neurologic: negative for visual changes, syncope, or dizziness All other systems reviewed and are otherwise negative except as noted above.    Blood pressure 112/80, pulse 50, height 5\' 11"  (1.803 m), weight 180 lb 1.6 oz (81.693 kg).  General appearance: alert and no distress Neck: no adenopathy, no carotid bruit, no JVD, supple, symmetrical, trachea midline and thyroid not enlarged, symmetric, no tenderness/mass/nodules Lungs: clear to auscultation bilaterally Heart: regular rate and rhythm, S1, S2 normal, no murmur, click, rub or gallop Extremities: extremities normal, atraumatic, no cyanosis or edema  EKG sinus bradycardia at 50 with ST or T wave changes  ASSESSMENT AND PLAN:   Coronary artery disease Status post coronary artery bypass grafting by Dr. Arvid Ponce 3/17/5 the LIMA to his LAD, vein  to diagonal branch, obtuse marginal branch of the circumflex. He denies chest pain or shortness of breath. His last Myoview performed 06/07/11 with an ischemic.  Essential hypertension Controlled on her medications  Hyperlipidemia On statin therapy followed by his PCP      Jose Harp MD [facility], [facility] 01/30/2014 3:26 PM

## 2014-07-02 ENCOUNTER — Encounter: Payer: Self-pay | Admitting: Cardiovascular Disease

## 2014-12-02 ENCOUNTER — Encounter: Payer: Self-pay | Admitting: Dietician

## 2014-12-02 ENCOUNTER — Encounter: Payer: Medicare PPO | Attending: Internal Medicine | Admitting: Dietician

## 2014-12-02 VITALS — Ht 70.0 in | Wt 180.6 lb

## 2014-12-02 DIAGNOSIS — Z713 Dietary counseling and surveillance: Secondary | ICD-10-CM | POA: Insufficient documentation

## 2014-12-02 DIAGNOSIS — E1121 Type 2 diabetes mellitus with diabetic nephropathy: Secondary | ICD-10-CM | POA: Insufficient documentation

## 2014-12-02 NOTE — Patient Instructions (Signed)
Goals: -Start eating 3 meals a day -Have a protein food with each meal and snack -Limit fried foods -Choose high fiber and low sugar foods -Increase physical activity  Breakfast: 1/2 cup oatmeal with cinnamon, a small handful of nuts, and 1 spoonful of raisins  Lunch: Tuna/chicken salad on english muffin or with a few crackers

## 2014-12-02 NOTE — Progress Notes (Signed)
  Medical Nutrition Therapy:  Appt start time: 1400 end time:  1500.   Assessment:  Primary concerns today: Jose Ponce is here today to discuss better nutrition to lower his HgbA1c. He states he has had diabetes for 12 years and has received education in the past. He is divorced and has 1 daughter and 3 granddaughters. 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 He does not test his blood sugars as he does not like needles. However, he is compliant with medications.    Preferred Learning Style:  No preference indicated   Learning Readiness:   Ready   MEDICATIONS: see list   DIETARY INTAKE:  Usual eating pattern includes 1 meals and 1 snacks per day. Avoided foods include starchy foods and high sugar foods.    24-hr recall:  Wakes up between 9-10am B ( AM): 2 cups of black coffee with cinnamon and a pinch of salt  Snk ( AM):   L ( PM):  Snk ( PM):  D (6-7 PM): Fried or baked fish from [place], [place] sprouts, carrots, yams; tunafish with crackers, lettuce or spinach Snk ( 12AM): jello, peanuts Goes to bed around 2am  Beverages: water with lemon, soda for low blood sugar  Usual physical activity: none  Estimated energy needs: 2000 calories 225 g carbohydrates 150 g protein 56 g fat  Progress Towards Goal(s):  In progress.   Nutritional Diagnosis:  NB-1.1 Food and nutrition-related knowledge deficit As related to erratic meal pattern and misconceptions about macronutrients and appropriate food choices.  As evidenced by elevated HgbA1c, 24-hour recall, and patient report.    Intervention:  Nutrition education provided. Explained effect of different macronutrients on blood sugars. Encouraged patient to eat at least 3 meals a day and include protein with each meal. Discussed the difference between sugar, fiber, and starches. Encouraged a high fiber diet and inclusion of exercise.  Goals: -Start eating 3 meals a  day -Have a protein food with each meal and snack -Limit fried foods -Choose high fiber and low sugar foods -Increase physical activity  Breakfast: 1/2 cup oatmeal with cinnamon, a small handful of nuts, and 1 spoonful of raisins  Lunch: Tuna/chicken salad on english muffin or with a few crackers  Teaching Method Utilized:  Visual Auditory Hands on  Handouts given during visit include:  High fiber foods list  Living Well with Diabetes  Meal planning card  15g CHO + protein snacks  MyPlate  Barriers to learning/adherence to lifestyle change: none  Demonstrated degree of understanding via:  Teach Back   Monitoring/Evaluation:  Dietary intake, exercise, labs, and body weight prn.

## 2014-12-06 ENCOUNTER — Telehealth: Payer: Self-pay

## 2015-01-08 ENCOUNTER — Telehealth: Payer: Self-pay | Admitting: Cardiovascular Disease

## 2015-01-08 NOTE — Telephone Encounter (Signed)
Received records from [facility] for appointment on 01/23/15 with Dr Gwenlyn Found.  Records given to [facility] (medical records) for Dr Kennon Holter schedule on 01/23/15.  lp

## 2015-01-20 ENCOUNTER — Encounter: Payer: Self-pay | Admitting: *Deleted

## 2015-01-23 ENCOUNTER — Ambulatory Visit (INDEPENDENT_AMBULATORY_CARE_PROVIDER_SITE_OTHER): Payer: Medicare PPO | Admitting: Cardiovascular Disease

## 2015-01-23 ENCOUNTER — Encounter: Payer: Self-pay | Admitting: Cardiovascular Disease

## 2015-01-23 VITALS — BP 126/64 | HR 52 | Ht 70.0 in | Wt 181.1 lb

## 2015-01-23 DIAGNOSIS — I10 Essential (primary) hypertension: Secondary | ICD-10-CM

## 2015-01-23 DIAGNOSIS — E785 Hyperlipidemia, unspecified: Secondary | ICD-10-CM

## 2015-01-23 DIAGNOSIS — I25810 Atherosclerosis of coronary artery bypass graft(s) without angina pectoris: Secondary | ICD-10-CM

## 2015-01-23 MED ORDER — SILDENAFIL CITRATE 50 MG PO TABS: 50.0000 mg | ORAL_TABLET | Freq: Every day | ORAL | Status: DC | PRN

## 2015-01-23 NOTE — Assessment & Plan Note (Signed)
History of hypertension with blood pressure measured at 126/64. He is on metoprolol and Benicar. Continue current meds at current dosing

## 2015-01-23 NOTE — Progress Notes (Signed)
01/23/2015 Jose Ponce   05-30-1941  [id number]  Primary Physician [name], MD Primary Cardiologist: Lorretta Harp MD [name]   HPI:  The patient is a very pleasant, evidently 74 year old, mildly overweight, divorced African American male, father of 39, grandfather to 3 grandchildren whom I last saw on 01/30/14.Jose Ponce He has a history of CAD status post coronary artery bypass grafting x3 by Dr. Gilford Raid on February 05, 2004. He had LIMA to his LAD, vein graft to the diagonal branch and obtuse marginal branch as well as the circumflex. His other problems include hypertension, dyslipidemia, and noninsulin-requiring diabetes. He does not smoke. He denies chest pain or shortness of breath. His last Myoview performed on June 07, 2011, was nonischemic. Dr. Baird Cancer follows his lipid profile. Since I saw him one year ago, he's been completely asymptomatic specifically denying chest pain or shortness of breath.    Current Outpatient Prescriptions  Medication Sig Dispense Refill  . aspirin 81 MG tablet Take 81 mg by mouth daily.    . cholecalciferol (VITAMIN D) 400 UNITS TABS tablet Take 5,000 Units by mouth.    . Diclofenac Sodium 3 % GEL Place onto the skin.    . febuxostat (ULORIC) 40 MG tablet Take 40 mg by mouth 2 (two) times daily.     . Flaxseed, Linseed, (FLAX SEEDS PO) Take 1 tablet by mouth daily.    Jose Ponce ketoconazole (NIZORAL) 2 % cream Apply 1 application topically daily.    . Linaclotide (LINZESS) 290 MCG CAPS capsule Take 290 mcg by mouth daily.    . metoprolol succinate (TOPROL-XL) 50 MG 24 hr tablet Take 50 mg by mouth daily.     Jose Ponce olmesartan (BENICAR) 20 MG tablet Take 20 mg by mouth daily.    . rosuvastatin (CRESTOR) 10 MG tablet Take 10 mg by mouth daily.    . saxagliptin HCl (ONGLYZA) 2.5 MG TABS tablet Take 2.5 mg by mouth daily.    . sildenafil (VIAGRA) 50 MG tablet Take 1 tablet (50 mg total) by mouth daily as needed for erectile dysfunction. 30 tablet  0   No current facility-administered medications for this visit.    Not on File  History   Social History  . Marital Status: Divorced    Spouse Name: N/A  . Number of Children: N/A  . Years of Education: N/A   Occupational History  . Not on file.   Social History Main Topics  . Smoking status: Never Smoker   . Smokeless tobacco: Not on file  . Alcohol Use: Not on file  . Drug Use: Not on file  . Sexual Activity: Not on file   Other Topics Concern  . Not on file   Social History Narrative     Review of Systems: General: negative for chills, fever, night sweats or weight changes.  Cardiovascular: negative for chest pain, dyspnea on exertion, edema, orthopnea, palpitations, paroxysmal nocturnal dyspnea or shortness of breath Dermatological: negative for rash Respiratory: negative for cough or wheezing Urologic: negative for hematuria Abdominal: negative for nausea, vomiting, diarrhea, bright red blood per rectum, melena, or hematemesis Neurologic: negative for visual changes, syncope, or dizziness All other systems reviewed and are otherwise negative except as noted above.    Blood pressure 126/64, pulse 52, height 5\' 10"  (1.778 m), weight 181 lb 1.6 oz (82.146 kg).  General appearance: alert and no distress Neck: no adenopathy, no carotid bruit, no JVD, supple, symmetrical, trachea midline and thyroid not enlarged,  symmetric, no tenderness/mass/nodules Lungs: clear to auscultation bilaterally Heart: regular rate and rhythm, S1, S2 normal, no murmur, click, rub or gallop Extremities: extremities normal, atraumatic, no cyanosis or edema  EKG sinus bradycardia at 52 with a ST or T-wave changes. I personally reviewed this EKG  ASSESSMENT AND PLAN:   Hyperlipidemia History of hyperlipidemia on rosuvastatin 10 mg a day followed by his PCP   Essential hypertension History of hypertension with blood pressure measured at 126/64. He is on metoprolol and Benicar.  Continue current meds at current dosing   Coronary artery disease History of CAD status post coronary artery bypass grafting by Dr. Caffie Pinto 02/05/04 with a LIMA to his LAD, vein graft to diagonal branch, obtuse marginal branch. His last Myoview performed 06/07/11 was nonischemic. He denies chest pain or shortness of breath       Lorretta Harp MD [facility], [facility] 01/23/2015 1:30 PM

## 2015-01-23 NOTE — Assessment & Plan Note (Signed)
History of CAD status post coronary artery bypass grafting by Dr. Caffie Pinto 02/05/04 with a LIMA to his LAD, vein graft to diagonal branch, obtuse marginal branch. His last Myoview performed 06/07/11 was nonischemic. He denies chest pain or shortness of breath

## 2015-01-23 NOTE — Patient Instructions (Signed)
Your physician wants you to follow-up in: ONE YEAR WITH DR BERRY You will receive a reminder letter in the mail two months in advance. If you don't receive a letter, please call our office to schedule the follow-up appointment.  

## 2015-01-23 NOTE — Assessment & Plan Note (Signed)
History of hyperlipidemia on rosuvastatin 10 mg a day followed by his PCP

## 2015-01-28 ENCOUNTER — Encounter: Payer: Self-pay | Admitting: Cardiovascular Disease

## 2015-01-30 ENCOUNTER — Encounter: Payer: Self-pay | Admitting: *Deleted

## 2015-02-25 DIAGNOSIS — C61 Malignant neoplasm of prostate: Secondary | ICD-10-CM | POA: Diagnosis not present

## 2015-07-14 DIAGNOSIS — J40 Bronchitis, not specified as acute or chronic: Secondary | ICD-10-CM | POA: Diagnosis not present

## 2015-08-04 DIAGNOSIS — Z0000 Encounter for general adult medical examination without abnormal findings: Secondary | ICD-10-CM | POA: Diagnosis not present

## 2015-08-04 DIAGNOSIS — I1310 Hypertensive heart and chronic kidney disease without heart failure, with stage 1 through stage 4 chronic kidney disease, or unspecified chronic kidney disease: Secondary | ICD-10-CM | POA: Diagnosis not present

## 2015-08-04 DIAGNOSIS — E1122 Type 2 diabetes mellitus with diabetic chronic kidney disease: Secondary | ICD-10-CM | POA: Diagnosis not present

## 2015-08-04 DIAGNOSIS — N183 Chronic kidney disease, stage 3 (moderate): Secondary | ICD-10-CM | POA: Diagnosis not present

## 2015-08-04 DIAGNOSIS — Z1389 Encounter for screening for other disorder: Secondary | ICD-10-CM | POA: Diagnosis not present

## 2015-08-04 DIAGNOSIS — E559 Vitamin D deficiency, unspecified: Secondary | ICD-10-CM | POA: Diagnosis not present

## 2015-08-04 DIAGNOSIS — Z23 Encounter for immunization: Secondary | ICD-10-CM | POA: Diagnosis not present

## 2015-08-04 DIAGNOSIS — I2510 Atherosclerotic heart disease of native coronary artery without angina pectoris: Secondary | ICD-10-CM | POA: Diagnosis not present

## 2015-08-14 DIAGNOSIS — C61 Malignant neoplasm of prostate: Secondary | ICD-10-CM | POA: Diagnosis not present

## 2015-08-20 DIAGNOSIS — C61 Malignant neoplasm of prostate: Secondary | ICD-10-CM | POA: Diagnosis not present

## 2015-11-05 DIAGNOSIS — E1122 Type 2 diabetes mellitus with diabetic chronic kidney disease: Secondary | ICD-10-CM | POA: Diagnosis not present

## 2015-11-05 DIAGNOSIS — N08 Glomerular disorders in diseases classified elsewhere: Secondary | ICD-10-CM | POA: Diagnosis not present

## 2015-11-05 DIAGNOSIS — N183 Chronic kidney disease, stage 3 (moderate): Secondary | ICD-10-CM | POA: Diagnosis not present

## 2015-11-05 DIAGNOSIS — I2510 Atherosclerotic heart disease of native coronary artery without angina pectoris: Secondary | ICD-10-CM | POA: Diagnosis not present

## 2016-11-29 ENCOUNTER — Encounter: Payer: Self-pay | Admitting: Cardiovascular Disease

## 2016-11-29 ENCOUNTER — Ambulatory Visit (INDEPENDENT_AMBULATORY_CARE_PROVIDER_SITE_OTHER): Payer: Medicare Other | Admitting: Cardiovascular Disease

## 2016-11-29 VITALS — BP 148/82 | HR 55 | Ht 70.0 in | Wt 173.6 lb

## 2016-11-29 DIAGNOSIS — E7800 Pure hypercholesterolemia, unspecified: Secondary | ICD-10-CM

## 2016-11-29 DIAGNOSIS — I10 Essential (primary) hypertension: Secondary | ICD-10-CM

## 2016-11-29 DIAGNOSIS — I2510 Atherosclerotic heart disease of native coronary artery without angina pectoris: Secondary | ICD-10-CM

## 2016-11-29 NOTE — Assessment & Plan Note (Signed)
History of CAD status post coronary artery bypass grafting X 3 by Dr. Cyndia Bent 02/05/2004. LIMA to his LAD, vein to diagonal branch, obtuse marginal branch of the circumflex. Myoview performed 06/07/2011 was nonischemic. He denies chest pain or shortness of breath.

## 2016-11-29 NOTE — Assessment & Plan Note (Signed)
History of hypertension blood pressure measured at 126/64. He is on metoprolol and Benicar. Continue current meds at current dosing

## 2016-11-29 NOTE — Patient Instructions (Signed)
Medication Instructions: Your physician recommends that you continue on your current medications as directed. Please refer to the Current Medication list given to you today.   Labwork: I will request labs from Dr. Baird Cancer.   Follow-Up: Your physician wants you to follow-up in: 1 year with Dr. Gwenlyn Found. You will receive a reminder letter in the mail two months in advance. If you don't receive a letter, please call our office to schedule the follow-up appointment.  If you need a refill on your cardiac medications before your next appointment, please call your pharmacy.

## 2016-11-29 NOTE — Progress Notes (Signed)
11/29/2016 Jose Ponce   1941-03-25  [id number]  Primary Physician [name], MD Primary Cardiologist: Lorretta Harp MD Lupe Carney, [place]  HPI:  The patient is a very pleasant, evidently 76 year old, mildly overweight, divorced African American male, father of 110, grandfather to 3 grandchildren whom I last saw on 01/23/15.[name] He has a history of CAD status post coronary artery bypass grafting x3 by Dr. Gilford Raid on February 05, 2004. He had LIMA to his LAD, vein graft to the diagonal branch and obtuse marginal branch as well as the circumflex. His other problems include hypertension, dyslipidemia, and noninsulin-requiring diabetes. He does not smoke. He denies chest pain or shortness of breath. His last Myoview performed on June 07, 2011, was nonischemic. Dr. Baird Cancer follows his lipid profile. Since I saw him one year ago, he's been completely asymptomatic specifically denying chest pain or shortness of breath.   Current Outpatient Prescriptions  Medication Sig Dispense Refill  . aspirin 81 MG tablet Take 81 mg by mouth daily.    . cholecalciferol (VITAMIN D) 400 UNITS TABS tablet Take 5,000 Units by mouth.    . Diclofenac Sodium 3 % GEL Place onto the skin.    . febuxostat (ULORIC) 40 MG tablet Take 40 mg by mouth 2 (two) times daily.     . Flaxseed, Linseed, (FLAX SEEDS PO) Take 1 tablet by mouth daily.    [name] ketoconazole (NIZORAL) 2 % cream Apply 1 application topically daily.    . Linaclotide (LINZESS) 290 MCG CAPS capsule Take 290 mcg by mouth daily.    . metoprolol succinate (TOPROL-XL) 50 MG 24 hr tablet Take 50 mg by mouth daily.     [name] olmesartan (BENICAR) 20 MG tablet Take 20 mg by mouth daily.    . rosuvastatin (CRESTOR) 10 MG tablet Take 10 mg by mouth daily.    . saxagliptin HCl (ONGLYZA) 2.5 MG TABS tablet Take 2.5 mg by mouth daily.    . sildenafil (VIAGRA) 50 MG tablet Take 1 tablet (50 mg total) by mouth daily as needed for erectile dysfunction. 30 tablet 0  .  telmisartan (MICARDIS) 20 MG tablet Pt takes half tablet daily by mouth  0   No current facility-administered medications for this visit.     Not on File  Social History   Social History  . Marital status: Divorced    Spouse name: N/A  . Number of children: N/A  . Years of education: N/A   Occupational History  . Not on file.   Social History Main Topics  . Smoking status: Never Smoker  . Smokeless tobacco: Not on file  . Alcohol use Not on file  . Drug use: Unknown  . Sexual activity: Not on file   Other Topics Concern  . Not on file   Social History Narrative  . No narrative on file     Review of Systems: General: negative for chills, fever, night sweats or weight changes.  Cardiovascular: negative for chest pain, dyspnea on exertion, edema, orthopnea, palpitations, paroxysmal nocturnal dyspnea or shortness of breath Dermatological: negative for rash Respiratory: negative for cough or wheezing Urologic: negative for hematuria Abdominal: negative for nausea, vomiting, diarrhea, bright red blood per rectum, melena, or hematemesis Neurologic: negative for visual changes, syncope, or dizziness All other systems reviewed and are otherwise negative except as noted above.    Blood pressure (!) 148/82, pulse (!) 55, height 5\' 10"  (1.778 m), weight 173 lb 9.6 oz (78.7  kg).  General appearance: alert and no distress Neck: no adenopathy, no carotid bruit, no JVD, supple, symmetrical, trachea midline and thyroid not enlarged, symmetric, no tenderness/mass/nodules Lungs: clear to auscultation bilaterally Heart: regular rate and rhythm, S1, S2 normal, no murmur, click, rub or gallop Extremities: extremities normal, atraumatic, no cyanosis or edema  EKG sinus bradycardia at 55 with right bundle branch block new since his prior tracing. I personally reviewed this EKG  ASSESSMENT AND PLAN:   Coronary artery disease History of CAD status post coronary artery bypass grafting X  3 by Dr. Cyndia Bent 02/05/2004. LIMA to his LAD, vein to diagonal branch, obtuse marginal branch of the circumflex. Myoview performed 06/07/2011 was nonischemic. He denies chest pain or shortness of breath.  Essential hypertension History of hypertension blood pressure measured at 126/64. He is on metoprolol and Benicar. Continue current meds at current dosing  Hyperlipidemia History of hyperlipidemia on statin therapy followed by his PCP      Lorretta Harp MD [facility], [facility] 11/29/2016 11:50 AM

## 2016-11-29 NOTE — Assessment & Plan Note (Signed)
History of hyperlipidemia on statin therapy followed by his PCP 

## 2017-10-19 ENCOUNTER — Telehealth: Payer: Self-pay | Admitting: Cardiovascular Disease

## 2017-10-19 NOTE — Telephone Encounter (Signed)
New message    Dr Baird Cancer is requesting the patient be moved from Dr Gwenlyn Found to Dr Oval Linsey

## 2017-10-19 NOTE — Telephone Encounter (Signed)
OK with me.

## 2017-10-20 NOTE — Telephone Encounter (Signed)
Fine with me as well

## 2017-10-20 NOTE — Telephone Encounter (Signed)
Left detailed message.   

## 2017-11-22 NOTE — Progress Notes (Signed)
Cardiology Office Note   Date:  11/23/2017   ID:  Jose Ponce, DOB 1940/12/03, MRN [id number]  PCP:  Glendale Chard, MD  Cardiologist:   Skeet Latch, MD   Chief Complaint  Patient presents with  . Follow-up      History of Present Illness: Jose Ponce is a 77 y.o. male with CAD s/p CABG (LIMA-->LAD, SVG-->D, SVG-->OM, SVG-->LCx in 2005), hypertension, hyperlipidemia, and DM who presents for follow up.  Jose Ponce was previously a patient of Dr. Gwenlyn Found.  He last had a Lexiscan Myoview 05/2011 that revealed LVEF 57% and was negative for ischemia.  Prior to his CABG he noted his gait was unstable.  He was referred for a stress thest that was abnormal and subsequent cath showed obstructive CAD.  He underwent CABG in 2005.  Since that time he has been feeling well.  He has no chest pain or shortness of breath.  He does not get much formal exercise but is very active.  He denies lower extremity edema, orthopnea, or PND.  He also has not noted any lightheadedness or dizziness.  He was diagnosed with prostate cancer 5 years ago and had radioactive seeds implanted.  His PSA has been well-controlled since that time.  He quit smoking in 1986 after a 10 year history of smoking less than 1 ppd.    Past Medical History:  Diagnosis Date  . Chronic kidney disease   . Coronary artery disease   . Gout   . Hyperlipidemia   . Hypertension   . Type 2 diabetes mellitus ([place])     Past Surgical History:  Procedure Laterality Date  . CARDIAC CATHETERIZATION  01/16/2004   multi-vessel coronary disease 40-50% Left main, 100% mid LAD75-80% ostial stenosis  . CARDIOVASCULAR STRESS TEST  08/09/2004   post CABG, low risk scan  . CARDIOVASCULAR STRESS TEST  10/23/2003   mild septal ischemia, EF43%,   . CORONARY ARTERY BYPASS GRAFT  02/05/2004   CABGx3 Dr.Bartle  . Lower Ext. Doppler  10/20/2011   Right Anterior Tibial: occlusive disease, Left Anterior Tibial occluded w/reconstitution at ankle,  mildly abnormal LEA  . NM MYOCAR PERF WALL MOTION  06/07/2011   protocol:Persantine, EF60%, negative for ischemia, low risk scan  . NM MYOCAR PERF WALL MOTION  08/05/2009   protocol: Persantine, EF57%. no evidence of ischemia, low risk scan.   . TRANSPERINEAL IMPLANT OF RADIATION SEEDS W/ ULTRASOUND       Current Outpatient Medications  Medication Sig Dispense Refill  . aspirin 81 MG tablet Take 81 mg by mouth daily.    . cholecalciferol (VITAMIN D) 400 UNITS TABS tablet Take 5,000 Units by mouth.    . Diclofenac Sodium 3 % GEL Place onto the skin.    . febuxostat (ULORIC) 40 MG tablet Take 40 mg by mouth 2 (two) times daily.     . Flaxseed, Linseed, (FLAX SEEDS PO) Take 1 tablet by mouth daily.    [name] ketoconazole (NIZORAL) 2 % cream Apply 1 application topically daily.    . Linaclotide (LINZESS) 290 MCG CAPS capsule Take 290 mcg by mouth daily.    . metoprolol succinate (TOPROL-XL) 50 MG 24 hr tablet Take 50 mg by mouth daily. Take with or immediately following a meal.    . rosuvastatin (CRESTOR) 10 MG tablet Take 10 mg by mouth daily.    . saxagliptin HCl (ONGLYZA) 2.5 MG TABS tablet Take 2.5 mg by mouth daily.    . sildenafil (VIAGRA)  50 MG tablet Take 1 tablet (50 mg total) by mouth daily as needed for erectile dysfunction. 30 tablet 0  . telmisartan (MICARDIS) 20 MG tablet Pt takes half tablet daily by mouth  0   No current facility-administered medications for this visit.     Allergies:   Patient has no allergy information on record.    Social History:  The patient  reports that he quit smoking about 33 years ago. His smoking use included cigarettes. he has never used smokeless tobacco.   Family History:  The patient's family history includes Asthma in his mother; Cancer (age of onset: 57) in his sister; Diabetes in his brother; Heart attack (age of onset: 46) in his father; Heart disease in his father and maternal grandmother; Stroke in his father.    ROS:  Please see the  history of present illness.   Otherwise, review of systems are positive for none.   All other systems are reviewed and negative.    PHYSICAL EXAM: VS:  BP (!) 150/81   Pulse (!) 52   Ht 5\' 10"  (1.778 m)   Wt 172 lb (78 kg)   BMI 24.68 kg/m  , BMI Body mass index is 24.68 kg/m. GENERAL:  Well appearing HEENT:  Pupils equal round and reactive, fundi not visualized, oral mucosa unremarkable NECK:  No jugular venous distention, waveform within normal limits, carotid upstroke brisk and symmetric, no bruits, no thyromegaly LUNGS:  Clear to auscultation bilaterally HEART:  RRR.  PMI not displaced or sustained,S1 and S2 within normal limits, no S3, no S4, no clicks, no rubs, no murmurs ABD:  Flat, positive bowel sounds normal in frequency in pitch, no bruits, no rebound, no guarding, no midline pulsatile mass, no hepatomegaly, no splenomegaly EXT:  2 plus pulses throughout, no edema, no cyanosis no clubbing. Resting hand tremor.  SKIN:  No rashes no nodules NEURO:  Cranial nerves II through XII grossly intact, motor grossly intact throughout PSYCH:  Cognitively intact, oriented to person place and time   EKG:  EKG is ordered today. The ekg ordered today demonstrates sinus bradycardia.  Rate 52 bpm.  RBBB.  First degree AV block.     Recent Labs: No results found for requested labs within last 8760 hours.   10/17/17 Hemoglobin A1c 7.8% Sodium 140, potassium 4.6, BUN 22, creatinine 1.59 AST 24, ALT 29  05/12/17: Total cholesterol 116, triglycerides 57, HDL 42, LDL 63  Lipid Panel No results found for: CHOL, TRIG, HDL, CHOLHDL, VLDL, LDLCALC, LDLDIRECT    Wt Readings from Last 3 Encounters:  11/23/17 172 lb (78 kg)  11/29/16 173 lb 9.6 oz (78.7 kg)  01/23/15 181 lb 1.6 oz (82.1 kg)      ASSESSMENT AND PLAN:  # CAD s/p CABG: No angina.  Continue aspirin, rosuvastatin, and metoprolol.  # Hypertension: BP elevated today both initially and on repeat.  It has been good with Dr.  Baird Cancer and he has been reportedly stable at home.  He also has been eating a lot of salt over the holidays.  I have asked him to check his blood pressures and call if it is greater than 130/80.  Continue metoprolol and telminsartan.  # Hyperlipidemia: LDL 63 04/2017.  Continue rosuvastatin.    Current medicines are reviewed at length with the patient today.  The patient does not have concerns regarding medicines.  The following changes have been made:  no change  Labs/ tests ordered today include: She has No orders of the  defined types were placed in this encounter.    Disposition:   FU with Lasheika Ortloff C. Oval Linsey, MD, [facility] in 4 months.     This note was written with the assistance of speech recognition software.  Please excuse any transcriptional errors.  Signed, Windsor Goeken C. Oval Linsey, MD, [facility]  11/23/2017 10:08 AM    [place]

## 2017-11-23 ENCOUNTER — Ambulatory Visit: Payer: Medicare Other | Admitting: Cardiovascular Disease

## 2017-11-23 ENCOUNTER — Encounter: Payer: Self-pay | Admitting: Cardiovascular Disease

## 2017-11-23 VITALS — BP 150/81 | HR 52 | Ht 70.0 in | Wt 172.0 lb

## 2017-11-23 DIAGNOSIS — E7800 Pure hypercholesterolemia, unspecified: Secondary | ICD-10-CM

## 2017-11-23 DIAGNOSIS — I2510 Atherosclerotic heart disease of native coronary artery without angina pectoris: Secondary | ICD-10-CM | POA: Diagnosis not present

## 2017-11-23 DIAGNOSIS — I10 Essential (primary) hypertension: Secondary | ICD-10-CM

## 2017-11-23 NOTE — Patient Instructions (Signed)
Medication Instructions:  Your physician recommends that you continue on your current medications as directed. Please refer to the Current Medication list given to you today.  Labwork: none  Testing/Procedures: none  Follow-Up: Your physician recommends that you schedule a follow-up appointment in: 4 month ov   Any Other Special Instructions Will Be Listed Below (If Applicable). Monitor your blood pressure at home daily for 1 week. If you blood pressure does not remain below 130/80 call the office [phone]  If you need a refill on your cardiac medications before your next appointment, please call your pharmacy.

## 2018-03-26 ENCOUNTER — Encounter: Payer: Self-pay | Admitting: Cardiovascular Disease

## 2018-03-26 ENCOUNTER — Ambulatory Visit: Payer: Medicare Other | Admitting: Cardiovascular Disease

## 2018-03-26 VITALS — BP 135/83 | HR 60 | Ht 70.5 in | Wt 171.0 lb

## 2018-03-26 DIAGNOSIS — I2510 Atherosclerotic heart disease of native coronary artery without angina pectoris: Secondary | ICD-10-CM

## 2018-03-26 DIAGNOSIS — E7800 Pure hypercholesterolemia, unspecified: Secondary | ICD-10-CM

## 2018-03-26 DIAGNOSIS — I10 Essential (primary) hypertension: Secondary | ICD-10-CM | POA: Diagnosis not present

## 2018-03-26 NOTE — Progress Notes (Signed)
Cardiology Office Note   Date:  03/26/2018   ID:  CLEAVE TERNES, DOB 01/03/1941, MRN [id number]  PCP:  Glendale Chard, MD  Cardiologist:   Skeet Latch, MD   Chief Complaint  Patient presents with  . Follow-up     History of Present Illness: Jose Ponce is a 77 y.o. male with CAD s/p CABG (LIMA-->LAD, SVG-->D, SVG-->OM, SVG-->LCx in 2005), hypertension, hyperlipidemia, and DM who presents for follow up.  Mr. Reinoso was previously a patient of Dr. Gwenlyn Found.  He last had a Lexiscan Myoview 05/2011 that revealed LVEF 57% and was negative for ischemia.  Prior to his CABG he noted his gait was unstable.  He was referred for a stress thest that was abnormal and subsequent cath showed obstructive CAD.  He underwent CABG in 2005.  Since that time he has been feeling well.  He has no chest pain or shortness of breath.  He does not get much formal exercise but is very active.  He denies lower extremity edema, orthopnea, or PND.  He also has not noted any lightheadedness or dizziness.  He was diagnosed with prostate cancer 5 years ago and had radioactive seeds implanted.  His PSA has been well-controlled since that time.  He quit smoking in 1986 after a 10 year history of smoking less than 1 ppd.   At his last appointment Mr. Rosselli' blood pressure was elevated but he attributed it to eating a lot of salt over the holidays.  Since that appointment he has been feeling well.  He has no chest pain or shortness of breath.  He denies lower extremity edema, orthopnea or PND.  He notes some indigestion that occurs at rest.  He has no exertional chest pain or pressure.  He has occasional right sided pinching chest pain that also occurs at rest.  He recently started a new insulin and thinks that this is working well.  He has follow-up scheduled with Dr. Baird Cancer soon.  He checks his blood pressure at home and it typically is in the 130s.   Past Medical History:  Diagnosis Date  . Chronic kidney disease   .  Coronary artery disease   . Gout   . Hyperlipidemia   . Hypertension   . Type 2 diabetes mellitus ([place])     Past Surgical History:  Procedure Laterality Date  . CARDIAC CATHETERIZATION  01/16/2004   multi-vessel coronary disease 40-50% Left main, 100% mid LAD75-80% ostial stenosis  . CARDIOVASCULAR STRESS TEST  08/09/2004   post CABG, low risk scan  . CARDIOVASCULAR STRESS TEST  10/23/2003   mild septal ischemia, EF43%,   . CORONARY ARTERY BYPASS GRAFT  02/05/2004   CABGx3 Dr.Bartle  . Lower Ext. Doppler  10/20/2011   Right Anterior Tibial: occlusive disease, Left Anterior Tibial occluded w/reconstitution at ankle, mildly abnormal LEA  . NM MYOCAR PERF WALL MOTION  06/07/2011   protocol:Persantine, EF60%, negative for ischemia, low risk scan  . NM MYOCAR PERF WALL MOTION  08/05/2009   protocol: Persantine, EF57%. no evidence of ischemia, low risk scan.   . TRANSPERINEAL IMPLANT OF RADIATION SEEDS W/ ULTRASOUND       Current Outpatient Medications  Medication Sig Dispense Refill  . aspirin 81 MG tablet Take 81 mg by mouth daily.    . cholecalciferol (VITAMIN D) 400 UNITS TABS tablet Take 5,000 Units by mouth.    . Diclofenac Sodium 3 % GEL Place onto the skin.    . febuxostat (ULORIC)  40 MG tablet Take 40 mg by mouth 2 (two) times daily.     . Flaxseed, Linseed, (FLAX SEEDS PO) Take 1 tablet by mouth daily.    [name] ketoconazole (NIZORAL) 2 % cream Apply 1 application topically daily.    . Linaclotide (LINZESS) 290 MCG CAPS capsule Take 290 mcg by mouth daily.    . metoprolol succinate (TOPROL-XL) 50 MG 24 hr tablet Take 50 mg by mouth daily. Take with or immediately following a meal.    . rosuvastatin (CRESTOR) 10 MG tablet Take 10 mg by mouth daily.    . saxagliptin HCl (ONGLYZA) 2.5 MG TABS tablet Take 2.5 mg by mouth daily.    . sildenafil (VIAGRA) 50 MG tablet Take 1 tablet (50 mg total) by mouth daily as needed for erectile dysfunction. 30 tablet 0  . telmisartan (MICARDIS)  20 MG tablet Pt takes half tablet daily by mouth  0   No current facility-administered medications for this visit.     Allergies:   Patient has no allergy information on record.    Social History:  The patient  reports that he quit smoking about 33 years ago. His smoking use included cigarettes. He has never used smokeless tobacco.   Family History:  The patient's family history includes Asthma in his mother; Cancer (age of onset: 43) in his sister; Diabetes in his brother; Heart attack (age of onset: 68) in his father; Heart disease in his father and maternal grandmother; Stroke in his father.    ROS:  Please see the history of present illness.   Otherwise, review of systems are positive for none.   All other systems are reviewed and negative.    PHYSICAL EXAM: VS:  BP 135/83   Pulse 60   Ht 5' 10.5" (1.791 m)   Wt 171 lb (77.6 kg)   BMI 24.19 kg/m  , BMI Body mass index is 24.19 kg/m. GENERAL:  Well appearing HEENT: Pupils equal round and reactive, fundi not visualized, oral mucosa unremarkable NECK:  No jugular venous distention, waveform within normal limits, carotid upstroke brisk and symmetric, no bruits LUNGS:  Clear to auscultation bilaterally HEART:  RRR.  PMI not displaced or sustained,S1 and S2 within normal limits, no S3, no S4, no clicks, no rubs, no murmurs ABD:  Flat, positive bowel sounds normal in frequency in pitch, no bruits, no rebound, no guarding, no midline pulsatile mass, no hepatomegaly, no splenomegaly EXT:  2 plus pulses throughout, no edema, no cyanosis no clubbing SKIN:  No rashes no nodules NEURO:  Cranial nerves II through XII grossly intact, motor grossly intact throughout PSYCH:  Cognitively intact, oriented to person place and time    EKG:  EKG is not ordered today. The ekg ordered 11/23/17 demonstrates sinus bradycardia.  Rate 52 bpm.  RBBB.  First degree AV block.     Recent Labs: No results found for requested labs within last 8760 hours.     10/17/17 Hemoglobin A1c 7.8% Sodium 140, potassium 4.6, BUN 22, creatinine 1.59 AST 24, ALT 29  05/12/17: Total cholesterol 116, triglycerides 57, HDL 42, LDL 63  Lipid Panel No results found for: CHOL, TRIG, HDL, CHOLHDL, VLDL, LDLCALC, LDLDIRECT    Wt Readings from Last 3 Encounters:  03/26/18 171 lb (77.6 kg)  11/23/17 172 lb (78 kg)  11/29/16 173 lb 9.6 oz (78.7 kg)      ASSESSMENT AND PLAN:  # CAD s/p CABG: No angina.  Continue aspirin, rosuvastatin, and metoprolol.  # Hypertension:  Blood pressure slightly above goal.  He should be less than 130/80.  He has not been exercising much.  I think that if he increases his exercise 250 minutes weekly his blood pressure will come down without titrating his antihypertensives.  # Hyperlipidemia: LDL 63 04/2017.  Continue rosuvastatin.    He has follow-up with Dr. Baird Cancer soon and will have his lipids checked at that time.  Continue metoprolol and telmisartan.   Current medicines are reviewed at length with the patient today.  The patient does not have concerns regarding medicines.  The following changes have been made:  no change  Labs/ tests ordered today include:  No orders of the defined types were placed in this encounter.    Disposition:   FU with Soleil Mas C. Oval Linsey, MD, [facility] in 4 months.       Signed, Daijah Scrivens C. Oval Linsey, MD, [facility]  03/26/2018 2:31 PM    [facility]

## 2018-03-26 NOTE — Patient Instructions (Signed)
Medication Instructions:  Your physician recommends that you continue on your current medications as directed. Please refer to the Current Medication list given to you today.  Labwork: NONE  Testing/Procedures: NONE  Follow-Up: Your physician recommends that you schedule a follow-up appointment in: 4 MONTH OV   Any Other Special Instructions Will Be Listed Below (If Applicable). INCREASE YOUR EXERCISE TO 150 MINUTES EACH WEEK  LIMIT YOUR SALT INTAKE   If you need a refill on your cardiac medications before your next appointment, please call your pharmacy.

## 2018-08-02 ENCOUNTER — Ambulatory Visit: Payer: Medicare Other | Admitting: Cardiovascular Disease

## 2018-08-02 ENCOUNTER — Encounter: Payer: Self-pay | Admitting: Cardiovascular Disease

## 2018-08-02 DIAGNOSIS — I10 Essential (primary) hypertension: Secondary | ICD-10-CM | POA: Diagnosis not present

## 2018-08-02 DIAGNOSIS — E7800 Pure hypercholesterolemia, unspecified: Secondary | ICD-10-CM | POA: Diagnosis not present

## 2018-08-02 DIAGNOSIS — I2510 Atherosclerotic heart disease of native coronary artery without angina pectoris: Secondary | ICD-10-CM

## 2018-08-02 NOTE — Progress Notes (Signed)
Cardiology Office Note   Date:  08/02/2018   ID:  Jose Ponce, DOB 04/24/1941, MRN [id number]  PCP:  Glendale Chard, MD  Cardiologist:   Skeet Latch, MD   No chief complaint on file.    History of Present Illness: Jose Ponce is a 77 y.o. male with CAD s/p CABG (LIMA-->LAD, SVG-->D, SVG-->OM, SVG-->LCx in 2005), hypertension, hyperlipidemia, and DM who presents for follow up.  Jose Ponce was previously a patient of Dr. Gwenlyn Found.  He last had a Lexiscan Myoview 05/2011 that revealed LVEF 57% and was negative for ischemia.  Prior to his CABG he noted his gait was unstable.  He was referred for a stress thest that was abnormal and subsequent cath showed obstructive CAD.  He underwent CABG in 2005.  Since that time he has been feeling well.  He has no chest pain or shortness of breath.  He does not get much formal exercise but is very active.  He denies lower extremity edema, orthopnea, or PND.  He also has not noted any lightheadedness or dizziness.  He was diagnosed with prostate Ponce 5 years ago and had radioactive seeds implanted.  His PSA has been well-controlled since that time.  He quit smoking in 1986 after a 10 year history of smoking less than 1 ppd.   Since his last appointment Jose Ponce has been well.  He has been trying to walk more for exercise.  He is also limiting beef and shellfish from his diet so that he will not have any more years.  Dr. Baird Ponce switched him from Uloric to allopurinol and he seems to be doing well.  He has not experienced any chest pain or shortness of breath.  He has no lower extremity edema, orthopnea or PND.  Although he does a lot of walking around shops he does not get formal exercise.   Past Medical History:  Diagnosis Date  . Chronic kidney disease   . Coronary artery disease   . Gout   . Hyperlipidemia   . Hypertension   . Type 2 diabetes mellitus ([place])     Past Surgical History:  Procedure Laterality Date  . CARDIAC CATHETERIZATION   01/16/2004   multi-vessel coronary disease 40-50% Left main, 100% mid LAD75-80% ostial stenosis  . CARDIOVASCULAR STRESS TEST  08/09/2004   post CABG, low risk scan  . CARDIOVASCULAR STRESS TEST  10/23/2003   mild septal ischemia, EF43%,   . CORONARY ARTERY BYPASS GRAFT  02/05/2004   CABGx3 Dr.Bartle  . Lower Ext. Doppler  10/20/2011   Right Anterior Tibial: occlusive disease, Left Anterior Tibial occluded w/reconstitution at ankle, mildly abnormal LEA  . NM MYOCAR PERF WALL MOTION  06/07/2011   protocol:Persantine, EF60%, negative for ischemia, low risk scan  . NM MYOCAR PERF WALL MOTION  08/05/2009   protocol: Persantine, EF57%. no evidence of ischemia, low risk scan.   . TRANSPERINEAL IMPLANT OF RADIATION SEEDS W/ ULTRASOUND       Current Outpatient Medications  Medication Sig Dispense Refill  . allopurinol (ZYLOPRIM) 100 MG tablet Take 50 mg by mouth daily.  1  . aspirin 81 MG tablet Take 81 mg by mouth daily.    . cholecalciferol (VITAMIN D) 400 UNITS TABS tablet Take 5,000 Units by mouth.    . Diclofenac Sodium 3 % GEL Place onto the skin.    . Flaxseed, Linseed, (FLAX SEEDS PO) Take 1 tablet by mouth daily.    [name] ketoconazole (NIZORAL) 2 % cream  Apply 1 application topically daily.    . Linaclotide (LINZESS) 290 MCG CAPS capsule Take 290 mcg by mouth daily.    . metoprolol succinate (TOPROL-XL) 50 MG 24 hr tablet Take 50 mg by mouth daily. Take with or immediately following a meal.    . rosuvastatin (CRESTOR) 10 MG tablet Take 10 mg by mouth daily.    . saxagliptin HCl (ONGLYZA) 2.5 MG TABS tablet Take 2.5 mg by mouth daily.    . Semaglutide (OZEMPIC, 0.25 OR 0.5 MG/DOSE, [place]) Inject 0.5 mg into the skin once a week.    . sildenafil (VIAGRA) 50 MG tablet Take 1 tablet (50 mg total) by mouth daily as needed for erectile dysfunction. 30 tablet 0  . telmisartan (MICARDIS) 20 MG tablet Pt takes half tablet daily by mouth  0   No current facility-administered medications for this  visit.     Allergies:   Patient has no allergy information on record.    Social History:  The patient  reports that he quit smoking about 33 years ago. His smoking use included cigarettes. He has never used smokeless tobacco.   Family History:  The patient's family history includes Asthma in his mother; Ponce (age of onset: 40) in his sister; Diabetes in his brother; Heart attack (age of onset: 26) in his father; Heart disease in his father and maternal grandmother; Stroke in his father.    ROS:  Please see the history of present illness.   Otherwise, review of systems are positive for none.   All other systems are reviewed and negative.    PHYSICAL EXAM: VS:  BP 116/60   Pulse (!) 56   Ht 5' 10.5" (1.791 m)   Wt 163 lb 6.4 oz (74.1 kg)   SpO2 98%   BMI 23.11 kg/m  , BMI Body mass index is 23.11 kg/m. GENERAL:  Well appearing HEENT: Pupils equal round and reactive, fundi not visualized, oral mucosa unremarkable NECK:  No jugular venous distention, waveform within normal limits, carotid upstroke brisk and symmetric, no bruits LUNGS:  Clear to auscultation bilaterally HEART:  RRR.  PMI not displaced or sustained,S1 and S2 within normal limits, no S3, no S4, no clicks, no rubs, no murmurs ABD:  Flat, positive bowel sounds normal in frequency in pitch, no bruits, no rebound, no guarding, no midline pulsatile mass, no hepatomegaly, no splenomegaly EXT:  2 plus pulses throughout, no edema, no cyanosis no clubbing SKIN:  No rashes no nodules NEURO:  Cranial nerves II through XII grossly intact, motor grossly intact throughout PSYCH:  Cognitively intact, oriented to person place and time   EKG:  EKG is ordered today. The ekg ordered 11/23/17 demonstrates sinus bradycardia.  Rate 52 bpm.  RBBB.  First degree AV block.   08/02/2018: Sinus bradycardia.  Rate 56 bpm.  Right bundle branch block.  LAFB.   Recent Labs: No results found for requested labs within last 8760 hours.    01/17/2018: Total cholesterol 131, triglycerides 95, HDL 45, LDL 67 10/17/17 Hemoglobin A1c 7.8% Sodium 140, potassium 4.6, BUN 22, creatinine 1.59 AST 24, ALT 29  05/12/17: Total cholesterol 116, triglycerides 57, HDL 42, LDL 63  Lipid Panel No results found for: CHOL, TRIG, HDL, CHOLHDL, VLDL, LDLCALC, LDLDIRECT    Wt Readings from Last 3 Encounters:  08/02/18 163 lb 6.4 oz (74.1 kg)  03/26/18 171 lb (77.6 kg)  11/23/17 172 lb (78 kg)      ASSESSMENT AND PLAN:  # CAD s/p CABG:  Jose Ponce is doing well. He has no angina.  Continue aspirin, rosuvastatin, and metoprolol.  # Hypertension: Blood pressure is much better today.  Jose Ponce was congratulated on losing weight and encouraged to keep walking.   # Hyperlipidemia: LDL 67 on 12/2017.  Continue rosuvastatin.       Current medicines are reviewed at length with the patient today.  The patient does not have concerns regarding medicines.  The following changes have been made:  no change  Labs/ tests ordered today include:  No orders of the defined types were placed in this encounter.    Disposition:   FU with [place] Flett C. Oval Linsey, MD, [facility] in 6 months.       Signed, Kevonta Phariss C. Oval Linsey, MD, [facility]  08/02/2018 4:33 PM    [place]

## 2018-08-02 NOTE — Patient Instructions (Signed)
Medication Instructions:  Your physician recommends that you continue on your current medications as directed. Please refer to the Current Medication list given to you today.  Labwork: none  Testing/Procedures: none  Follow-Up: Your physician wants you to follow-up in: 6 months  You will receive a reminder letter in the mail two months in advance. If you don't receive a letter, please call our office to schedule the follow-up appointment.  If you need a refill on your cardiac medications before your next appointment, please call your pharmacy.  

## 2018-08-02 NOTE — Addendum Note (Signed)
Addended by: Alvina Filbert B on: 08/02/2018 05:17 PM   Modules accepted: Orders

## 2018-08-06 ENCOUNTER — Ambulatory Visit: Payer: Medicare Other | Admitting: Family Medicine

## 2018-08-06 DIAGNOSIS — Z0289 Encounter for other administrative examinations: Secondary | ICD-10-CM

## 2018-08-15 DIAGNOSIS — Z79899 Other long term (current) drug therapy: Secondary | ICD-10-CM

## 2018-08-15 DIAGNOSIS — M109 Gout, unspecified: Secondary | ICD-10-CM

## 2018-08-15 DIAGNOSIS — Z23 Encounter for immunization: Secondary | ICD-10-CM

## 2018-09-18 ENCOUNTER — Other Ambulatory Visit: Payer: Self-pay | Admitting: Internal Medicine

## 2018-10-02 ENCOUNTER — Other Ambulatory Visit: Payer: Self-pay | Admitting: Internal Medicine

## 2018-10-09 ENCOUNTER — Emergency Department (HOSPITAL_BASED_OUTPATIENT_CLINIC_OR_DEPARTMENT_OTHER): Payer: Medicare Other

## 2018-10-09 ENCOUNTER — Emergency Department (HOSPITAL_BASED_OUTPATIENT_CLINIC_OR_DEPARTMENT_OTHER)
Admission: EM | Admit: 2018-10-09 | Discharge: 2018-10-09 | Disposition: A | Discharge status: Home / Self Care | Payer: Medicare Other | Attending: Emergency Medicine | Admitting: Emergency Medicine

## 2018-10-09 ENCOUNTER — Other Ambulatory Visit: Payer: Self-pay

## 2018-10-09 ENCOUNTER — Encounter (HOSPITAL_BASED_OUTPATIENT_CLINIC_OR_DEPARTMENT_OTHER): Payer: Self-pay | Admitting: *Deleted

## 2018-10-09 DIAGNOSIS — M545 Low back pain, unspecified: Secondary | ICD-10-CM

## 2018-10-09 DIAGNOSIS — M4310 Spondylolisthesis, site unspecified: Secondary | ICD-10-CM

## 2018-10-09 DIAGNOSIS — G8929 Other chronic pain: Secondary | ICD-10-CM

## 2018-10-09 DIAGNOSIS — Z7982 Long term (current) use of aspirin: Secondary | ICD-10-CM | POA: Insufficient documentation

## 2018-10-09 DIAGNOSIS — M51369 Other intervertebral disc degeneration, lumbar region without mention of lumbar back pain or lower extremity pain: Secondary | ICD-10-CM

## 2018-10-09 DIAGNOSIS — N189 Chronic kidney disease, unspecified: Secondary | ICD-10-CM | POA: Insufficient documentation

## 2018-10-09 DIAGNOSIS — I2510 Atherosclerotic heart disease of native coronary artery without angina pectoris: Secondary | ICD-10-CM | POA: Diagnosis not present

## 2018-10-09 DIAGNOSIS — I129 Hypertensive chronic kidney disease with stage 1 through stage 4 chronic kidney disease, or unspecified chronic kidney disease: Secondary | ICD-10-CM | POA: Diagnosis not present

## 2018-10-09 DIAGNOSIS — M5136 Other intervertebral disc degeneration, lumbar region: Secondary | ICD-10-CM | POA: Insufficient documentation

## 2018-10-09 DIAGNOSIS — F17210 Nicotine dependence, cigarettes, uncomplicated: Secondary | ICD-10-CM | POA: Diagnosis not present

## 2018-10-09 DIAGNOSIS — Z79899 Other long term (current) drug therapy: Secondary | ICD-10-CM | POA: Diagnosis not present

## 2018-10-09 DIAGNOSIS — E1122 Type 2 diabetes mellitus with diabetic chronic kidney disease: Secondary | ICD-10-CM | POA: Diagnosis not present

## 2018-10-09 DIAGNOSIS — Z951 Presence of aortocoronary bypass graft: Secondary | ICD-10-CM | POA: Insufficient documentation

## 2018-10-09 MED ORDER — LIDOCAINE 5 % EX PTCH: 1.0000 | MEDICATED_PATCH | CUTANEOUS | 0 refills | Status: DC

## 2018-10-09 MED ORDER — LIDOCAINE 5 % EX PTCH
1.0000 | MEDICATED_PATCH | CUTANEOUS | Status: DC
  Filled 2018-10-09: qty 1

## 2018-10-09 NOTE — Discharge Instructions (Addendum)
You have been diagnosed today with chronic left-sided lower back pain without sciatica.  At this time there does not appear to be the presence of an emergent medical condition, however there is always the potential for conditions to change. Please read and follow the below instructions.  Please return to the Emergency Department immediately for any new or worsening symptoms or if your symptoms do not improve. Please be sure to follow up with your Primary Care Provider within 7 days regarding your visit today; please call their office to schedule an appointment even if you are feeling better for a follow-up visit. Your x-ray of your lower back today showed chronic changes including degenerative disc disease and anterolisthesis without fracture or dislocation. Please discuss these findings with your primary care provider at your next visit. You may use the Lidoderm patch today for comfort, placed this over your left sided lower back muscles to help with your symptoms. You may also use heating pads to help with your symptoms.  Get help right away if: You develop new bowel or bladder control problems. You have unusual weakness or numbness in your arms or legs. You have color change to your arms or legs You develop nausea or vomiting. You develop abdominal pain. You feel faint  Please read the additional information packets attached to your discharge summary.

## 2018-10-09 NOTE — ED Notes (Signed)
Pt verbalizes understanding of d/c instructions and denies any further needs at this time. 

## 2018-10-09 NOTE — ED Notes (Signed)
Patient transported to X-ray 

## 2018-10-09 NOTE — ED Triage Notes (Signed)
Pt reports back pain x Sunday night, denies any injury or trauma, denies any urinary sx. States pain improves with extending left leg, pain increases with walking and standing for long periods.

## 2018-10-09 NOTE — ED Provider Notes (Signed)
[facility] EMERGENCY DEPARTMENT Provider Note   CSN: [id number] Arrival date & time: 10/09/18  1418     History   Chief Complaint Chief Complaint  Patient presents with  . Back Pain    HPI Jose Ponce is a 77 y.o. male with history of hypertension, diabetes, CKD and CAD presenting today for acute on chronic left-sided lower back pain.  Patient states that he has had intermittent pain to his lower back for multiple years.  Patient states that his current episode of back pain began 2 days ago.  He states that the pain is on the left side of his back, constant, moderate intensity and throbbing in nature.  Patient states that his pain is worsened with leaning his body to the right or standing.  Patient states that pain is improved with leg extension and in his recliner.  Patient believes that the pain started the day after he was moving a chair around his house.  Patient denies injury trauma or immediate pain.  Patient denies numbness/tingling or weakness of the extremities, bowel/bladder incontinence, saddle area paresthesias, abdominal pain, chest pain, shortness of breath, color change of the extremities, fall or injury.  HPI  Past Medical History:  Diagnosis Date  . Chronic kidney disease   . Coronary artery disease   . Gout   . Hyperlipidemia   . Hypertension   . Type 2 diabetes mellitus [facility])     Patient Active Problem List   Diagnosis Date Noted  . Coronary artery disease 01/30/2014  . Essential hypertension 01/30/2014  . Hyperlipidemia 01/30/2014  . Type 2 diabetes mellitus ([place]) 01/30/2014    Past Surgical History:  Procedure Laterality Date  . CARDIAC CATHETERIZATION  01/16/2004   multi-vessel coronary disease 40-50% Left main, 100% mid LAD75-80% ostial stenosis  . CARDIOVASCULAR STRESS TEST  08/09/2004   post CABG, low risk scan  . CARDIOVASCULAR STRESS TEST  10/23/2003   mild septal ischemia, EF43%,   . CORONARY ARTERY BYPASS GRAFT  02/05/2004     CABGx3 Dr.Bartle  . Lower Ext. Doppler  10/20/2011   Right Anterior Tibial: occlusive disease, Left Anterior Tibial occluded w/reconstitution at ankle, mildly abnormal LEA  . NM MYOCAR PERF WALL MOTION  06/07/2011   protocol:Persantine, EF60%, negative for ischemia, low risk scan  . NM MYOCAR PERF WALL MOTION  08/05/2009   protocol: Persantine, EF57%. no evidence of ischemia, low risk scan.   . TRANSPERINEAL IMPLANT OF RADIATION SEEDS W/ ULTRASOUND        Home Medications    Prior to Admission medications   Medication Sig Start Date End Date Taking? Authorizing Provider  allopurinol (ZYLOPRIM) 100 MG tablet TAKE 1/2 TABLET BY MOUTH EVERY DAY 10/02/18   Glendale Chard, MD  aspirin 81 MG tablet Take 81 mg by mouth daily.    [provider]  cholecalciferol (VITAMIN D) 400 UNITS TABS tablet Take 5,000 Units by mouth.    [provider]  Diclofenac Sodium 3 % GEL Place onto the skin.    [provider]  Flaxseed, Linseed, (FLAX SEEDS PO) Take 1 tablet by mouth daily.    [provider]  ketoconazole (NIZORAL) 2 % cream Apply 1 application topically daily.    [provider]  lidocaine (LIDODERM) 5 % Place 1 patch onto the skin daily. Remove & Discard patch within 12 hours or as directed by MD Place over left lower back. 10/09/18   Deliah Boston, PA-C  Linaclotide (LINZESS) 290 MCG CAPS  capsule Take 290 mcg by mouth daily.    [provider]  metoprolol succinate (TOPROL-XL) 50 MG 24 hr tablet Take 50 mg by mouth daily. Take with or immediately following a meal.    [provider]  rosuvastatin (CRESTOR) 10 MG tablet Take 10 mg by mouth daily.    [provider]  saxagliptin HCl (ONGLYZA) 2.5 MG TABS tablet Take 2.5 mg by mouth daily.    [provider]  Semaglutide (OZEMPIC, 0.25 OR 0.5 MG/DOSE, [place]) Inject 0.5 mg into the skin once a week.    [provider]  sildenafil (VIAGRA) 50 MG tablet Take  1 tablet (50 mg total) by mouth daily as needed for erectile dysfunction. 01/23/15   Lorretta Harp, MD  telmisartan (MICARDIS) 20 MG tablet TAKE 1 TABLET BY MOUTH ONCE DAILY 10/02/18   Glendale Chard, MD    Family History Family History  Problem Relation Age of Onset  . Asthma Mother   . Heart disease Father   . Stroke Father   . Heart attack Father 66  . Cancer Sister 63  . Diabetes Brother   . Heart disease Maternal Grandmother     Social History Social History   Tobacco Use  . Smoking status: Former Smoker    Types: Cigarettes    Last attempt to quit: 11/21/1984    Years since quitting: 33.9  . Smokeless tobacco: Never Used  Substance Use Topics  . Alcohol use: Not on file  . Drug use: Not on file     Allergies   Patient has no known allergies.   Review of Systems Review of Systems  Constitutional: Negative.  Negative for chills and fever.  Respiratory: Negative.  Negative for shortness of breath.   Cardiovascular: Negative.  Negative for chest pain.  Gastrointestinal: Negative.  Negative for abdominal pain.  Genitourinary: Negative.  Negative for dysuria and hematuria.  Musculoskeletal: Positive for back pain. Negative for gait problem, neck pain and neck stiffness.  Skin: Negative.  Negative for color change and wound.  Neurological: Negative.  Negative for dizziness, syncope, weakness and numbness.       Denies saddle area paresthesias Denies bowel/bladder incontinence.   Physical Exam Updated Vital Signs BP (!) 143/82 (BP Location: Left Arm)   Pulse 60   Temp 97.6 F (36.4 C)   Resp 14   Ht 5\' 10"  (1.778 m)   Wt 78 kg   SpO2 100%   BMI 24.68 kg/m   Physical Exam  Constitutional: He appears well-developed and well-nourished. No distress.  HENT:  Head: Normocephalic and atraumatic.  Right Ear: External ear normal.  Left Ear: External ear normal.  Nose: Nose normal.  Eyes: Pupils are equal, round, and reactive to light. EOM are normal.  Neck:  Trachea normal, normal range of motion, full passive range of motion without pain and phonation normal. Neck supple. No tracheal tenderness, no spinous process tenderness and no muscular tenderness present. No tracheal deviation present.  Cardiovascular: Normal rate and regular rhythm.  Pulses:      Radial pulses are 2+ on the right side, and 2+ on the left side.       Dorsalis pedis pulses are 2+ on the right side, and 2+ on the left side.       Posterior tibial pulses are 2+ on the right side, and 2+ on the left side.  Pulmonary/Chest: Effort normal. No respiratory distress. He exhibits no tenderness, no crepitus and no deformity.  Abdominal: Soft.  Bowel sounds are normal. He exhibits no distension and no pulsatile midline mass. There is no tenderness. There is no rigidity, no rebound and no guarding.  Musculoskeletal: Normal range of motion.       Cervical back: Normal.       Thoracic back: Normal.       Lumbar back: He exhibits tenderness. He exhibits no swelling, no edema and no deformity.       Back:       Right lower leg: Normal.       Left lower leg: Normal.  Patient with left-sided muscular tenderness to palpation, muscular spasm noted to the left side.  Pain is worsened when patient leans his torso to the right.  No midline spinal tenderness to palpation, no crepitus or deformity or step-off of the spine.  No right-sided paraspinal muscular tenderness to palpation.  No signs of injury to the back.  Feet:  Right Foot:  Protective Sensation: 3 sites tested. 3 sites sensed.  Left Foot:  Protective Sensation: 3 sites tested. 3 sites sensed.  Neurological: He is alert. GCS eye subscore is 4. GCS verbal subscore is 5. GCS motor subscore is 6.  Speech is clear and goal oriented, follows commands Major Cranial nerves without deficit, no facial droop Normal strength in upper and lower extremities bilaterally including dorsiflexion and plantar flexion, strong and equal grip  strength Sensation normal to light touch Moves extremities without ataxia, coordination intact Normal gait  Skin: Skin is warm and dry.  Psychiatric: He has a normal mood and affect. His behavior is normal.   ED Treatments / Results  Labs (all labs ordered are listed, but only abnormal results are displayed) Labs Reviewed - No data to display  EKG None  Radiology Dg Lumbar Spine Complete  Result Date: 10/09/2018 CLINICAL DATA:  Left lower back pain for several days. No reported injury. EXAM: LUMBAR SPINE - COMPLETE 4+ VIEW COMPARISON:  None. FINDINGS: This report assumes 5 non rib-bearing lumbar vertebrae. Lumbar vertebral body heights are preserved, with no fracture. Mild multilevel lumbar degenerative disc disease, most prominent at L3-4 and L4-5. There is 7 mm anterolisthesis at L4-5. Mild bilateral lower lumbar facet arthropathy. No aggressive appearing focal osseous lesions. Abdominal aortic atherosclerosis. IMPRESSION: 1. No acute osseous abnormality. 2. Mild multilevel lumbar degenerative disc disease, most prominent at L3-4 and L4-5. 3. Anterolisthesis at L4-5 measuring 7 mm. 4. Mild bilateral lower lumbar facet arthropathy. Electronically Signed   By: Ilona Sorrel M.D.   On: 10/09/2018 16:09    Procedures Procedures (including critical care time)  Medications Ordered in ED Medications  lidocaine (LIDODERM) 5 % 1 patch (1 patch Transdermal Not Given 10/09/18 1641)     Initial Impression / Assessment and Plan / ED Course  I have reviewed the triage vital signs and the nursing notes.  Pertinent labs & imaging results that were available during my care of the patient were reviewed by me and considered in my medical decision making (see chart for details).     DG Lumbar Spine: IMPRESSION: 1. No acute osseous abnormality. 2. Mild multilevel lumbar degenerative disc disease, most prominent at L3-4 and L4-5. 3. Anterolisthesis at L4-5 measuring 7 mm. 4. Mild bilateral lower  lumbar facet arthropathy.   78 year old male presenting today for acute on chronic left-sided lower back pain.  Pain is consistently reproduced with palpation of left back musculature and with movement on examination today. Pedal pulses intact and equal bilaterally, do not suspect aortic aneurysm. No  neurological deficits and normal neuro exam.  Patient can walk but states is painful.  Patient denies loss of bowel/bladder control or saddle area paresthesias.  No concern for cauda equina.  No fever, night sweats, weight loss, h/o cancer, or IVDU.   RICE protocol discussed with the patient.  Patient prescribed Lidoderm patch for comfort.  Patient encouraged to follow-up with his primary care provider within 7 days for reevaluation.  Patient informed of x-ray findings including degenerative disc disease, anterior listhesis and arthropathy and to follow-up with his primary care provider regarding these findings.  Patient is afebrile, not tachycardic, not hypotensive well-appearing and in no acute distress.  Patient is pleasant and agreeable with plan of care at this time.  At this time there does not appear to be any evidence of an acute emergency medical condition and the patient appears stable for discharge with appropriate outpatient follow up. Diagnosis was discussed with patient who verbalizes understanding of care plan and is agreeable to discharge. I have discussed return precautions with patient who verbalizes understanding of return precautions. Patient strongly encouraged to follow-up with their PCP in 7 days. All questions answered.  Patient's case discussed with Dr. Laverta Baltimore who agrees with plan to discharge with lidoderm and follow-up.   Note: Portions of this report may have been transcribed using voice recognition software. Every effort was made to ensure accuracy; however, inadvertent computerized transcription errors may still be present. Final Clinical Impressions(s) / ED Diagnoses   Final  diagnoses:  Chronic left-sided low back pain without sciatica  Degenerative disc disease, lumbar  Anterolisthesis    ED Discharge Orders         Ordered    lidocaine (LIDODERM) 5 %  Every 24 hours     10/09/18 1645           Gari Crown 10/09/18 1708    Margette Fast, MD 10/10/18 1257

## 2018-10-10 ENCOUNTER — Encounter: Payer: Self-pay | Admitting: Nurse Practitioner

## 2018-10-10 ENCOUNTER — Ambulatory Visit: Payer: Medicare Other | Admitting: Nurse Practitioner

## 2018-10-10 VITALS — BP 124/80 | HR 55 | Temp 97.5°F | Ht 70.5 in | Wt 165.4 lb

## 2018-10-10 DIAGNOSIS — M5136 Other intervertebral disc degeneration, lumbar region: Secondary | ICD-10-CM

## 2018-10-10 DIAGNOSIS — M549 Dorsalgia, unspecified: Principal | ICD-10-CM

## 2018-10-10 DIAGNOSIS — G8929 Other chronic pain: Secondary | ICD-10-CM

## 2018-10-10 DIAGNOSIS — Z7982 Long term (current) use of aspirin: Secondary | ICD-10-CM | POA: Diagnosis not present

## 2018-10-10 MED ORDER — TRIAMCINOLONE ACETONIDE 40 MG/ML IJ SUSP
40.0000 mg | Freq: Once | INTRAMUSCULAR | Status: AC
  Administered 2018-10-10: 40 mg via INTRAMUSCULAR

## 2018-10-10 NOTE — Progress Notes (Signed)
Subjective:     Patient ID: Jose Ponce , male    DOB: 1941/10/07 , 77 y.o.   MRN: [id number]   Chief Complaint  Patient presents with  . Back Pain    patient went to urgent care yesterday due to having back pain and was told he had a degenrative disc in his back    HPI  Seen at Urgent Care yesterday and diagnosed with degenerative disc disease.    Back Pain  This is a new problem. The current episode started in the past 7 days. The problem occurs constantly. The problem has been gradually worsening since onset. The pain is present in the lumbar spine. The quality of the pain is described as aching. Radiates to: left side of hip. The pain is at a severity of 10/10. The symptoms are aggravated by sitting. Pertinent negatives include no abdominal pain or weakness. Risk factors include sedentary lifestyle.     Past Medical History:  Diagnosis Date  . Chronic kidney disease   . Coronary artery disease   . Gout   . Hyperlipidemia   . Hypertension   . Type 2 diabetes mellitus (HCC)      Family History  Problem Relation Age of Onset  . Asthma Mother   . Heart disease Father   . Stroke Father   . Heart attack Father 51  . Cancer Sister 73  . Diabetes Brother   . Heart disease Maternal Grandmother      Current Outpatient Medications:  .  allopurinol (ZYLOPRIM) 100 MG tablet, TAKE 1/2 TABLET BY MOUTH EVERY DAY, Disp: 15 tablet, Rfl: 0 .  aspirin 81 MG tablet, Take 81 mg by mouth daily., Disp: , Rfl:  .  cholecalciferol (VITAMIN D) 400 UNITS TABS tablet, Take 5,000 Units by mouth., Disp: , Rfl:  .  Diclofenac Sodium 3 % GEL, Place onto the skin., Disp: , Rfl:  .  Flaxseed, Linseed, (FLAX SEEDS PO), Take 1 tablet by mouth daily., Disp: , Rfl:  .  ketoconazole (NIZORAL) 2 % cream, Apply 1 application topically daily., Disp: , Rfl:  .  Linaclotide (LINZESS) 290 MCG CAPS capsule, Take 290 mcg by mouth daily., Disp: , Rfl:  .  metoprolol succinate (TOPROL-XL) 50 MG 24 hr tablet,  Take 50 mg by mouth daily. Take with or immediately following a meal., Disp: , Rfl:  .  rosuvastatin (CRESTOR) 10 MG tablet, Take 10 mg by mouth daily., Disp: , Rfl:  .  saxagliptin HCl (ONGLYZA) 2.5 MG TABS tablet, Take 2.5 mg by mouth daily., Disp: , Rfl:  .  Semaglutide (OZEMPIC, 0.25 OR 0.5 MG/DOSE, [place]), Inject 0.5 mg into the skin once a week., Disp: , Rfl:  .  sildenafil (VIAGRA) 50 MG tablet, Take 1 tablet (50 mg total) by mouth daily as needed for erectile dysfunction., Disp: 30 tablet, Rfl: 0 .  telmisartan (MICARDIS) 20 MG tablet, TAKE 1 TABLET BY MOUTH ONCE DAILY, Disp: 90 tablet, Rfl: 0 .  lidocaine (LIDODERM) 5 %, Place 1 patch onto the skin daily. Remove & Discard patch within 12 hours or as directed by MD Place over left lower back. (Patient not taking: Reported on 10/10/2018), Disp: 30 patch, Rfl: 0   No Known Allergies   Review of Systems  Respiratory: Negative.   Cardiovascular: Negative.   Gastrointestinal: Negative for abdominal pain.  Musculoskeletal: Positive for back pain. Negative for neck pain.  Skin: Negative.   Neurological: Negative.  Negative for weakness.  Today's Vitals   10/10/18 1413  BP: 124/80  Pulse: (!) 55  Temp: (!) 97.5 F (36.4 C)  SpO2: 97%  Weight: 165 lb 6.4 oz (75 kg)  Height: 5' 10.5" (1.791 m)   Body mass index is 23.4 kg/m.   Objective:  Physical Exam  Constitutional: He is oriented to person, place, and time. He appears well-developed and well-nourished.  Cardiovascular: Normal rate, regular rhythm and normal heart sounds.  Pulmonary/Chest: Effort normal and breath sounds normal.  Musculoskeletal: He exhibits tenderness.  Neurological: He is alert and oriented to person, place, and time.  Skin: Skin is warm and dry. Capillary refill takes less than 2 seconds.        Assessment And Plan:   1. Chronic left-sided back pain, unspecified back location  Advised to use the lidocaine patch he used over the counter   Will  administer Kenalog 40 mg IM  He will be here Friday for a nurse visit and will check his blood sugar to make sure steroid did not cause to increase  Encouraged to rest his back today - triamcinolone acetonide (KENALOG-40) injection 40 mg        Minette Brine, FNP

## 2018-10-12 ENCOUNTER — Other Ambulatory Visit: Payer: Self-pay | Admitting: Internal Medicine

## 2018-10-12 ENCOUNTER — Other Ambulatory Visit: Payer: Self-pay

## 2018-11-08 ENCOUNTER — Ambulatory Visit (INDEPENDENT_AMBULATORY_CARE_PROVIDER_SITE_OTHER): Payer: Medicare Other | Admitting: Internal Medicine

## 2018-11-08 ENCOUNTER — Ambulatory Visit (INDEPENDENT_AMBULATORY_CARE_PROVIDER_SITE_OTHER): Payer: Medicare Other

## 2018-11-08 ENCOUNTER — Encounter: Payer: Self-pay | Admitting: Internal Medicine

## 2018-11-08 VITALS — BP 132/70 | HR 71 | Temp 97.9°F | Ht 71.0 in | Wt 164.8 lb

## 2018-11-08 VITALS — BP 132/70 | HR 59 | Temp 97.9°F | Ht 71.0 in | Wt 164.8 lb

## 2018-11-08 DIAGNOSIS — I2510 Atherosclerotic heart disease of native coronary artery without angina pectoris: Secondary | ICD-10-CM

## 2018-11-08 DIAGNOSIS — Z79899 Other long term (current) drug therapy: Secondary | ICD-10-CM

## 2018-11-08 DIAGNOSIS — I1310 Hypertensive heart and chronic kidney disease without heart failure, with stage 1 through stage 4 chronic kidney disease, or unspecified chronic kidney disease: Secondary | ICD-10-CM

## 2018-11-08 DIAGNOSIS — Z7982 Long term (current) use of aspirin: Secondary | ICD-10-CM

## 2018-11-08 DIAGNOSIS — E1122 Type 2 diabetes mellitus with diabetic chronic kidney disease: Secondary | ICD-10-CM | POA: Diagnosis not present

## 2018-11-08 DIAGNOSIS — N183 Chronic kidney disease, stage 3 unspecified: Secondary | ICD-10-CM

## 2018-11-08 DIAGNOSIS — Z0000 Encounter for general adult medical examination without abnormal findings: Secondary | ICD-10-CM

## 2018-11-08 LAB — POCT URINALYSIS DIPSTICK
Bilirubin, UA: NEGATIVE
Blood, UA: NEGATIVE
Glucose, UA: NEGATIVE
Ketones, UA: NEGATIVE
Leukocytes, UA: NEGATIVE
Nitrite, UA: NEGATIVE
Protein, UA: NEGATIVE
Spec Grav, UA: 1.02 (ref 1.010–1.025)
Urobilinogen, UA: 1 E.U./dL
pH, UA: 5.5 (ref 5.0–8.0)

## 2018-11-08 LAB — POCT UA - MICROALBUMIN
Albumin/Creatinine Ratio, Urine, POC: 30
Creatinine, POC: 200 mg/dL
Microalbumin Ur, POC: 30 mg/L

## 2018-11-08 NOTE — Patient Instructions (Signed)
Jose Ponce , Thank you for taking time to come for your Medicare Wellness Visit. I appreciate your ongoing commitment to your health goals. Please review the following plan we discussed and let me know if I can assist you in the future.   Screening recommendations/referrals: Colonoscopy: 06/2014 Recommended yearly ophthalmology/optometry visit for glaucoma screening and checkup Recommended yearly dental visit for hygiene and checkup  Vaccinations: Influenza vaccine: 08/2018 Pneumococcal vaccine: 08/2011 Tdap vaccine: 07/2013 Shingles vaccine: discussed    Advanced directives: Please bring a copy of your POA (Power of [place]) and/or Living Will to your next appointment.    Conditions/risks identified: low exercise  Next appointment: 03/14/2019 at 11:15  Preventive Care 65 Years and Older, Male Preventive care refers to lifestyle choices and visits with your health care provider that can promote health and wellness. What does preventive care include?  A yearly physical exam. This is also called an annual well check.  Dental exams once or twice a year.  Routine eye exams. Ask your health care provider how often you should have your eyes checked.  Personal lifestyle choices, including:  Daily care of your teeth and gums.  Regular physical activity.  Eating a healthy diet.  Avoiding tobacco and drug use.  Limiting alcohol use.  Practicing safe sex.  Taking low doses of aspirin every day.  Taking vitamin and mineral supplements as recommended by your health care provider. What happens during an annual well check? The services and screenings done by your health care provider during your annual well check will depend on your age, overall health, lifestyle risk factors, and family history of disease. Counseling  Your health care provider may ask you questions about your:  Alcohol use.  Tobacco use.  Drug use.  Emotional well-being.  Home and relationship  well-being.  Sexual activity.  Eating habits.  History of falls.  Memory and ability to understand (cognition).  Work and work Statistician. Screening  You may have the following tests or measurements:  Height, weight, and BMI.  Blood pressure.  Lipid and cholesterol levels. These may be checked every 5 years, or more frequently if you are over 49 years old.  Skin check.  Lung cancer screening. You may have this screening every year starting at age 76 if you have a 30-pack-year history of smoking and currently smoke or have quit within the past 15 years.  Fecal occult blood test (FOBT) of the stool. You may have this test every year starting at age 76  Flexible sigmoidoscopy or colonoscopy. You may have a sigmoidoscopy every 5 years or a colonoscopy every 10 years starting at age 76  Prostate cancer screening. Recommendations will vary depending on your family history and other risks.  Hepatitis C blood test.  Hepatitis B blood test.  Sexually transmitted disease (STD) testing.  Diabetes screening. This is done by checking your blood sugar (glucose) after you have not eaten for a while (fasting). You may have this done every 1-3 years.  Abdominal aortic aneurysm (AAA) screening. You may need this if you are a current or former smoker.  Osteoporosis. You may be screened starting at age 76 if you are at high risk. Talk with your health care provider about your test results, treatment options, and if necessary, the need for more tests. Vaccines  Your health care provider may recommend certain vaccines, such as:  Influenza vaccine. This is recommended every year.  Tetanus, diphtheria, and acellular pertussis (Tdap, Td) vaccine. You may need a Td booster  every 10 years.  Zoster vaccine. You may need this after age 76.  Pneumococcal 13-valent conjugate (PCV13) vaccine. One dose is recommended after age 76.  Pneumococcal polysaccharide (PPSV23) vaccine. One dose is  recommended after age 15. Talk to your health care provider about which screenings and vaccines you need and how often you need them. This information is not intended to replace advice given to you by your health care provider. Make sure you discuss any questions you have with your health care provider. Document Released: 12/04/2015 Document Revised: 07/27/2016 Document Reviewed: 09/08/2015 Elsevier Interactive Patient Education  2017 [place] Prevention in the Home Falls can cause injuries. They can happen to people of all ages. There are many things you can do to make your home safe and to help prevent falls. What can I do on the outside of my home?  Regularly fix the edges of walkways and driveways and fix any cracks.  Remove anything that might make you trip as you walk through a door, such as a raised step or threshold.  Trim any bushes or trees on the path to your home.  Use bright outdoor lighting.  Clear any walking paths of anything that might make someone trip, such as rocks or tools.  Regularly check to see if handrails are loose or broken. Make sure that both sides of any steps have handrails.  Any raised decks and porches should have guardrails on the edges.  Have any leaves, snow, or ice cleared regularly.  Use sand or salt on walking paths during winter.  Clean up any spills in your garage right away. This includes oil or grease spills. What can I do in the bathroom?  Use night lights.  Install grab bars by the toilet and in the tub and shower. Do not use towel bars as grab bars.  Use non-skid mats or decals in the tub or shower.  If you need to sit down in the shower, use a plastic, non-slip stool.  Keep the floor dry. Clean up any water that spills on the floor as soon as it happens.  Remove soap buildup in the tub or shower regularly.  Attach bath mats securely with double-sided non-slip rug tape.  Do not have throw rugs and other things on  the floor that can make you trip. What can I do in the bedroom?  Use night lights.  Make sure that you have a light by your bed that is easy to reach.  Do not use any sheets or blankets that are too big for your bed. They should not hang down onto the floor.  Have a firm chair that has side arms. You can use this for support while you get dressed.  Do not have throw rugs and other things on the floor that can make you trip. What can I do in the kitchen?  Clean up any spills right away.  Avoid walking on wet floors.  Keep items that you use a lot in easy-to-reach places.  If you need to reach something above you, use a strong step stool that has a grab bar.  Keep electrical cords out of the way.  Do not use floor polish or wax that makes floors slippery. If you must use wax, use non-skid floor wax.  Do not have throw rugs and other things on the floor that can make you trip. What can I do with my stairs?  Do not leave any items on the stairs.  Make  sure that there are handrails on both sides of the stairs and use them. Fix handrails that are broken or loose. Make sure that handrails are as long as the stairways.  Check any carpeting to make sure that it is firmly attached to the stairs. Fix any carpet that is loose or worn.  Avoid having throw rugs at the top or bottom of the stairs. If you do have throw rugs, attach them to the floor with carpet tape.  Make sure that you have a light switch at the top of the stairs and the bottom of the stairs. If you do not have them, ask someone to add them for you. What else can I do to help prevent falls?  Wear shoes that:  Do not have high heels.  Have rubber bottoms.  Are comfortable and fit you well.  Are closed at the toe. Do not wear sandals.  If you use a stepladder:  Make sure that it is fully opened. Do not climb a closed stepladder.  Make sure that both sides of the stepladder are locked into place.  Ask someone to  hold it for you, if possible.  Clearly mark and make sure that you can see:  Any grab bars or handrails.  First and last steps.  Where the edge of each step is.  Use tools that help you move around (mobility aids) if they are needed. These include:  Canes.  Walkers.  Scooters.  Crutches.  Turn on the lights when you go into a dark area. Replace any light bulbs as soon as they burn out.  Set up your furniture so you have a clear path. Avoid moving your furniture around.  If any of your floors are uneven, fix them.  If there are any pets around you, be aware of where they are.  Review your medicines with your doctor. Some medicines can make you feel dizzy. This can increase your chance of falling. Ask your doctor what other things that you can do to help prevent falls. This information is not intended to replace advice given to you by your health care provider. Make sure you discuss any questions you have with your health care provider. Document Released: 09/03/2009 Document Revised: 04/14/2016 Document Reviewed: 12/12/2014 Elsevier Interactive Patient Education  2017 [employer].    Exercise for Older Adults Staying physically active is important as you age. The four types of exercises that are best for older adults are endurance, strength, balance, and flexibility. Contact your health care provider before you start any exercise routine. Ask your health care provider what activities are safe for you. What are the risks? Risks associated with exercising include:  Overdoing it. This may lead to sore muscles or fatigue.  Falls.  Injuries.  Dehydration. How to do these exercises Endurance exercises Endurance (aerobic) exercises raise your breathing rate and heart rate. Increasing your endurance helps you to do everyday tasks and stay healthy. By improving the health of your body system that includes your heart, lungs, and blood vessels (circulatory system), you may also  delay or prevent diseases such as heart disease, diabetes, and bone loss (osteoporosis). Types of endurance exercises include:  Sports.  Indoor activities, such as using gym equipment, doing water aerobics, or dancing.  Outdoor activities, such as biking or jogging.  Tasks around the house, such as gardening, yard work, and heavy household chores like cleaning.  Walking, such as hiking or walking around your neighborhood. When doing endurance exercises, make sure you:  Are aware of your surroundings.  Use safety equipment as directed.  Dress in layers when exercising outdoors.  Drink plenty of water to stay well hydrated. Build up endurance slowly. Start with 10 minutes at a time, and gradually build up to doing 30 minutes at a time. Unless your health care provider gave you different instructions, aim to exercise for a total of 150 minutes a week. Spread out that time so you are working on endurance on 3 or more days a week. Strength exercises Lifting, pulling, or pushing weights helps to strengthen muscles. Having stronger muscles makes it easier to do everyday activities, such as getting up from a chair, climbing stairs, carrying groceries, and playing with grandchildren. Strength exercises include arm and leg exercises that may be done:  With weights.  Without weights (using your own body weight).  With a resistance band. When doing strength exercises:  Move smoothly and steadily. Do not suddenly thrust or jerk the weights, the resistance band, or your body.  Start with no weights or with light weights, and gradually add more weight over time. Eventually, aim to use weights that are hard or very hard for you to lift. This means that you are able to do 8 repetitions with the weight, and the last few repetitions are very challenging.  Lift or push weights into position for 3 seconds, hold the position for 1 second, and then take 3 seconds to return to your starting  position.  Breathe out (exhale) during difficult movements, like lifting or pushing weights. Breathe in (inhale) to relax your muscles before the next repetition.  Consider alternating arms or legs, especially when you first start strength exercises.  Expect some slight muscle soreness after each session. Do strength exercises on 2 or more days a week, for 30 minutes at a time. Avoid exercising the same muscle groups two days in a row. For example, if you work on your leg muscles one day, work on your arm muscles the next day. When you can do two sets of 10-15 repetitions with a certain weight, increase the amount of weight. Balance Balance exercises can help to prevent falls. Balance exercises include:  Standing on one foot.  Heel-to-toe walk.  Balance walk.  Tai chi. Make sure you have something sturdy to hold onto while doing balance exercises, such as a sturdy chair. As your balance improves, challenge yourself by holding onto the chair with one hand instead of two, and then with no hands. Trying exercises with your eyes closed also challenges your balance, but be sure to have a sturdy surface (like a countertop) close by in case you need it. Do balance exercises as often as you want, or as often as directed by your health care provider. Strength exercises for the lower body also help to improve balance. Flexibility Flexibility exercises improve how far you can bend, straighten, move, or rotate parts of your body (range of motion). These exercises also help you to do everyday activities such as getting dressed or reaching for objects. Flexibility exercises include stretching different parts of the body, and they may be done in a standing or seated position or on the floor. When stretching, make sure you:  Keep a slight bend in your arms and legs. Avoid completely straightening ("locking") your joints.  Do not stretch so far that you feel pain. You should feel a mild stretching feeling.  You may try stretching farther as you become more flexible over time.  Relax and breathe between  stretches.  Hold onto something sturdy for balance as needed. Hold each stretch for 10-30 seconds. Repeat each stretch 3-5 times. General safety tips  Exercise in well-lit areas.  Do not hold your breath during exercises or stretches.  Warm up before exercising, and cool down after exercising. This can help prevent injury.  Drink plenty of water during exercise or any activity that makes you sweat.  Use smooth, steady movements. Do not use sudden, jerking movements, especially when lifting weights or doing flexibility exercises.  If you are not sure if an exercise is safe for you, or you are not sure how to do an exercise, talk with your health care provider. This is especially important if you have had surgery on muscles, bones, or joints (orthopedic surgery). Where to find more information You can find more information about exercise for older adults from:  Your local health department, fitness center, or community center. These facilities may have programs for aging adults.  [employer] on Aging: http://kim-miller.com/  National Council on Aging: www.ncoa.org Summary  Staying physically active is important as you age.  Make sure to contact your health care provider before you start any exercise routine. Ask your health care provider what activities are safe for you.  Doing endurance, strength, balance, and flexibility exercises can help to delay or prevent certain diseases, such as heart disease, diabetes, and bone loss (osteoporosis). This information is not intended to replace advice given to you by your health care provider. Make sure you discuss any questions you have with your health care provider. Document Released: 03/29/2017 Document Revised: 03/29/2017 Document Reviewed: 03/29/2017 Elsevier Interactive Patient Education  2019 [employer].

## 2018-11-08 NOTE — Progress Notes (Signed)
Subjective:   Jose Ponce is a 77 y.o. male who presents for Medicare Annual/Subsequent preventive examination.  Review of Systems:  n/a Cardiac Risk Factors include: advanced age (>90men, >12 women);diabetes mellitus;hypertension;male gender;sedentary lifestyle     Objective:    Vitals: BP 132/70 (BP Location: Left Arm, Patient Position: Sitting)   Pulse (!) 59   Temp 97.9 F (36.6 C) (Oral)   Ht 5\' 11"  (1.803 m)   Wt 164 lb 12.8 oz (74.8 kg)   SpO2 98%   BMI 22.98 kg/m   Body mass index is 22.98 kg/m.  Advanced Directives 11/08/2018 10/09/2018 12/02/2014  Does Patient Have a Medical Advance Directive? Yes No No  Type of Advance Directive Living will - -  Would patient like information on creating a medical advance directive? - No - Patient declined No - patient declined information    Tobacco Social History   Tobacco Use  Smoking Status Former Smoker  . Types: Cigarettes  . Last attempt to quit: 11/21/1984  . Years since quitting: 33.9  Smokeless Tobacco Never Used     Counseling given: Not Answered   Clinical Intake:  Pre-visit preparation completed: Yes  Pain : No/denies pain Pain Score: 0-No pain     Nutritional Status: BMI of 19-24  Normal Nutritional Risks: None Diabetes: Yes CBG done?: No Did pt. bring in CBG monitor from home?: No  How often do you need to have someone help you when you read instructions, pamphlets, or other written materials from your doctor or pharmacy?: 1 - Never  Interpreter Needed?: No  Information entered by :: NAllen LPN  Past Medical History:  Diagnosis Date  . Chronic kidney disease   . Coronary artery disease   . Gout   . Hyperlipidemia   . Hypertension   . Type 2 diabetes mellitus ([place])    Past Surgical History:  Procedure Laterality Date  . CARDIAC CATHETERIZATION  01/16/2004   multi-vessel coronary disease 40-50% Left main, 100% mid LAD75-80% ostial stenosis  . CARDIOVASCULAR STRESS TEST  08/09/2004     post CABG, low risk scan  . CARDIOVASCULAR STRESS TEST  10/23/2003   mild septal ischemia, EF43%,   . CORONARY ARTERY BYPASS GRAFT  02/05/2004   CABGx3 Dr.Bartle  . Lower Ext. Doppler  10/20/2011   Right Anterior Tibial: occlusive disease, Left Anterior Tibial occluded w/reconstitution at ankle, mildly abnormal LEA  . NM MYOCAR PERF WALL MOTION  06/07/2011   protocol:Persantine, EF60%, negative for ischemia, low risk scan  . NM MYOCAR PERF WALL MOTION  08/05/2009   protocol: Persantine, EF57%. no evidence of ischemia, low risk scan.   . TRANSPERINEAL IMPLANT OF RADIATION SEEDS W/ ULTRASOUND     Family History  Problem Relation Age of Onset  . Asthma Mother   . Heart disease Father   . Stroke Father   . Heart attack Father 24  . Cancer Sister 80  . Diabetes Brother   . Heart disease Maternal Grandmother    Social History   Socioeconomic History  . Marital status: Divorced    Spouse name: Not on file  . Number of children: Not on file  . Years of education: Not on file  . Highest education level: Not on file  Occupational History  . Occupation: retired  Scientific laboratory technician  . Financial resource strain: Not hard at all  . Food insecurity:    Worry: Never true    Inability: Never true  . Transportation needs:    Medical:  No    Non-medical: No  Tobacco Use  . Smoking status: Former Smoker    Types: Cigarettes    Last attempt to quit: 11/21/1984    Years since quitting: 33.9  . Smokeless tobacco: Never Used  Substance and Sexual Activity  . Alcohol use: Not Currently  . Drug use: Not Currently  . Sexual activity: Yes  Lifestyle  . Physical activity:    Days per week: 0 days    Minutes per session: 0 min  . Stress: Not at all  Relationships  . Social connections:    Talks on phone: Not on file    Gets together: Not on file    Attends religious service: Not on file    Active member of club or organization: Not on file    Attends meetings of clubs or organizations: Not  on file    Relationship status: Not on file  Other Topics Concern  . Not on file  Social History Narrative  . Not on file    Outpatient Encounter Medications as of 11/08/2018  Medication Sig  . allopurinol (ZYLOPRIM) 100 MG tablet TAKE 1/2 TABLET BY MOUTH EVERY DAY  . aspirin 81 MG tablet Take 81 mg by mouth daily.  . cholecalciferol (VITAMIN D) 400 UNITS TABS tablet Take 5,000 Units by mouth.  . Diclofenac Sodium 3 % GEL Place onto the skin.  . Flaxseed, Linseed, (FLAX SEEDS PO) Take 1 tablet by mouth daily.  [name] ketoconazole (NIZORAL) 2 % cream Apply 1 application topically daily.  . Linaclotide (LINZESS) 290 MCG CAPS capsule Take 290 mcg by mouth daily.  . metoprolol succinate (TOPROL-XL) 50 MG 24 hr tablet TAKE 1 TABLET BY MOUTH ONCE DAILY  . rosuvastatin (CRESTOR) 10 MG tablet Take 10 mg by mouth daily.  . Semaglutide (OZEMPIC, 0.25 OR 0.5 MG/DOSE, [place]) Inject 0.5 mg into the skin once a week.  . sildenafil (VIAGRA) 50 MG tablet Take 1 tablet (50 mg total) by mouth daily as needed for erectile dysfunction.  [name] telmisartan (MICARDIS) 20 MG tablet TAKE 1 TABLET BY MOUTH ONCE DAILY  . lidocaine (LIDODERM) 5 % Place 1 patch onto the skin daily. Remove & Discard patch within 12 hours or as directed by MD Place over left lower back. (Patient not taking: Reported on 11/08/2018)  . saxagliptin HCl (ONGLYZA) 2.5 MG TABS tablet Take 2.5 mg by mouth daily.   No facility-administered encounter medications on file as of 11/08/2018.     Activities of Daily Living In your present state of health, do you have any difficulty performing the following activities: 11/08/2018  Hearing? N  Vision? N  Difficulty concentrating or making decisions? N  Walking or climbing stairs? N  Dressing or bathing? N  Doing errands, shopping? N  Preparing Food and eating ? N  Using the Toilet? N  In the past six months, have you accidently leaked urine? N  Do you have problems with loss of bowel control? N    Managing your Medications? N  Managing your Finances? N  Housekeeping or managing your Housekeeping? N  Some recent data might be hidden    Patient Care Team: Glendale Chard, MD as PCP - General (Internal Medicine) Lorretta Harp, MD as PCP - Cardiology (Cardiology)   Assessment:   This is a routine wellness examination for Jose Ponce.  Exercise Activities and Dietary recommendations Current Exercise Habits: The patient does not participate in regular exercise at present  Goals    . DIET - INCREASE  WATER INTAKE (pt-stated)    . Exercise 150 min/wk Moderate Activity (pt-stated)       Fall Risk Fall Risk  11/08/2018 10/10/2018  Falls in the past year? 0 0   Is the patient's home free of loose throw rugs in walkways, pet beds, electrical cords, etc?   yes      Grab bars in the bathroom? no      Handrails on the stairs?   yes      Adequate lighting?   yes  Timed Get Up and Go Performed: n/a  Depression Screen PHQ 2/9 Scores 11/08/2018 10/10/2018  PHQ - 2 Score 0 0  PHQ- 9 Score 0 -    Cognitive Function     6CIT Screen 11/08/2018  What Year? 0 points  What month? 0 points  What time? 0 points  Count back from 20 0 points  Months in reverse 0 points  Repeat phrase 0 points  Total Score 0    Immunization History  Administered Date(s) Administered  . Influenza-Unspecified 08/21/2018    Qualifies for Shingles Vaccine?  yes  Screening Tests Health Maintenance  Topic Date Due  . HEMOGLOBIN A1C  11/13/42  . FOOT EXAM  12/28/1951  . OPHTHALMOLOGY EXAM  12/28/1951  . TETANUS/TDAP  12/27/1960  . PNA vac Low Risk Adult (1 of 2 - PCV13) 12/27/2006  . INFLUENZA VACCINE  Completed   Cancer Screenings: Lung: Low Dose CT Chest recommended if Age 41-80 years, 30 pack-year currently smoking OR have quit w/in 15years. Patient does not qualify. Colorectal: up to date  Additional Screenings:  Hepatitis C Screening:n/a      Plan:    Checking to see if he had  prevnar 13 vaccination  I have personally reviewed and noted the following in the patient's chart:   . Medical and social history . Use of alcohol, tobacco or illicit drugs  . Current medications and supplements . Functional ability and status . Nutritional status . Physical activity . Advanced directives . List of other physicians . Hospitalizations, surgeries, and ER visits in previous 12 months . Vitals . Screenings to include cognitive, depression, and falls . Referrals and appointments  In addition, I have reviewed and discussed with patient certain preventive protocols, quality metrics, and best practice recommendations. A written personalized care plan for preventive services as well as general preventive health recommendations were provided to patient.     Kellie Simmering, LPN  [id number]

## 2018-11-08 NOTE — Patient Instructions (Signed)
Diabetes Mellitus and Exercise Exercising regularly is important for your overall health, especially when you have diabetes (diabetes mellitus). Exercising is not only about losing weight. It has many other health benefits, such as increasing muscle strength and bone density and reducing body fat and stress. This leads to improved fitness, flexibility, and endurance, all of which result in better overall health. Exercise has additional benefits for people with diabetes, including:  Reducing appetite.  Helping to lower and control blood glucose.  Lowering blood pressure.  Helping to control amounts of fatty substances (lipids) in the blood, such as cholesterol and triglycerides.  Helping the body to respond better to insulin (improving insulin sensitivity).  Reducing how much insulin the body needs.  Decreasing the risk for heart disease by: ? Lowering cholesterol and triglyceride levels. ? Increasing the levels of good cholesterol. ? Lowering blood glucose levels. What is my activity plan? Your health care provider or certified diabetes educator can help you make a plan for the type and frequency of exercise (activity plan) that works for you. Make sure that you:  Do at least 150 minutes of moderate-intensity or vigorous-intensity exercise each week. This could be brisk walking, biking, or water aerobics. ? Do stretching and strength exercises, such as yoga or weightlifting, at least 2 times a week. ? Spread out your activity over at least 3 days of the week.  Get some form of physical activity every day. ? Do not go more than 2 days in a row without some kind of physical activity. ? Avoid being inactive for more than 30 minutes at a time. Take frequent breaks to walk or stretch.  Choose a type of exercise or activity that you enjoy, and set realistic goals.  Start slowly, and gradually increase the intensity of your exercise over time. What do I need to know about managing my  diabetes?   Check your blood glucose before and after exercising. ? If your blood glucose is 240 mg/dL (13.3 mmol/L) or higher before you exercise, check your urine for ketones. If you have ketones in your urine, do not exercise until your blood glucose returns to normal. ? If your blood glucose is 100 mg/dL (5.6 mmol/L) or lower, eat a snack containing 15-20 grams of carbohydrate. Check your blood glucose 15 minutes after the snack to make sure that your level is above 100 mg/dL (5.6 mmol/L) before you start your exercise.  Know the symptoms of low blood glucose (hypoglycemia) and how to treat it. Your risk for hypoglycemia increases during and after exercise. Common symptoms of hypoglycemia can include: ? Hunger. ? Anxiety. ? Sweating and feeling clammy. ? Confusion. ? Dizziness or feeling light-headed. ? Increased heart rate or palpitations. ? Blurry vision. ? Tingling or numbness around the mouth, lips, or tongue. ? Tremors or shakes. ? Irritability.  Keep a rapid-acting carbohydrate snack available before, during, and after exercise to help prevent or treat hypoglycemia.  Avoid injecting insulin into areas of the body that are going to be exercised. For example, avoid injecting insulin into: ? The arms, when playing tennis. ? The legs, when jogging.  Keep records of your exercise habits. Doing this can help you and your health care provider adjust your diabetes management plan as needed. Write down: ? Food that you eat before and after you exercise. ? Blood glucose levels before and after you exercise. ? The type and amount of exercise you have done. ? When your insulin is expected to peak, if you use   insulin. Avoid exercising at times when your insulin is peaking.  When you start a new exercise or activity, work with your health care provider to make sure the activity is safe for you, and to adjust your insulin, medicines, or food intake as needed.  Drink plenty of water while  you exercise to prevent dehydration or heat stroke. Drink enough fluid to keep your urine clear or pale yellow. Summary  Exercising regularly is important for your overall health, especially when you have diabetes (diabetes mellitus).  Exercising has many health benefits, such as increasing muscle strength and bone density and reducing body fat and stress.  Your health care provider or certified diabetes educator can help you make a plan for the type and frequency of exercise (activity plan) that works for you.  When you start a new exercise or activity, work with your health care provider to make sure the activity is safe for you, and to adjust your insulin, medicines, or food intake as needed. This information is not intended to replace advice given to you by your health care provider. Make sure you discuss any questions you have with your health care provider. Document Released: 01/28/2004 Document Revised: 05/18/2017 Document Reviewed: 04/18/2016 Elsevier Interactive Patient Education  2019 Elsevier Inc.  

## 2018-11-09 LAB — LIPID PANEL
Chol/HDL Ratio: 2.3 ratio (ref 0.0–5.0)
Cholesterol, Total: 140 mg/dL (ref 100–199)
HDL: 60 mg/dL (ref >39)
LDL Calculated: 70 mg/dL (ref 0–99)
Triglycerides: 48 mg/dL (ref 0–149)
VLDL Cholesterol Cal: 10 mg/dL (ref 5–40)

## 2018-11-09 LAB — CMP14+EGFR
ALT: 18 IU/L (ref 0–44)
AST: 12 IU/L (ref 0–40)
Albumin/Globulin Ratio: 1.4 (ref 1.2–2.2)
Albumin: 4.3 g/dL (ref 3.5–4.8)
Alkaline Phosphatase: 59 IU/L (ref 39–117)
BUN/Creatinine Ratio: 15 (ref 10–24)
BUN: 22 mg/dL (ref 8–27)
Bilirubin Total: 0.6 mg/dL (ref 0.0–1.2)
CO2: 24 mmol/L (ref 20–29)
Calcium: 9.9 mg/dL (ref 8.6–10.2)
Chloride: 99 mmol/L (ref 96–106)
Creatinine, Ser: 1.49 mg/dL — ABNORMAL HIGH (ref 0.76–1.27)
GFR calc Af Amer: 52 mL/min/{1.73_m2} — ABNORMAL LOW (ref >59)
GFR calc non Af Amer: 45 mL/min/{1.73_m2} — ABNORMAL LOW (ref >59)
Globulin, Total: 3 g/dL (ref 1.5–4.5)
Glucose: 131 mg/dL — ABNORMAL HIGH (ref 65–99)
Potassium: 4.3 mmol/L (ref 3.5–5.2)
Sodium: 140 mmol/L (ref 134–144)
Total Protein: 7.3 g/dL (ref 6.0–8.5)

## 2018-11-09 LAB — HEMOGLOBIN A1C
Est. average glucose Bld gHb Est-mCnc: 148 mg/dL
Hgb A1c MFr Bld: 6.8 % — ABNORMAL HIGH (ref 4.8–5.6)

## 2018-11-10 ENCOUNTER — Other Ambulatory Visit: Payer: Self-pay | Admitting: Internal Medicine

## 2018-11-11 NOTE — Progress Notes (Signed)
Subjective:     Patient ID: Jose Ponce , male    DOB: 1940/11/30 , 77 y.o.   MRN: [id number]   Chief Complaint  Patient presents with  . Diabetes  . Hypertension    HPI  Diabetes  He presents for his follow-up diabetic visit. He has type 2 diabetes mellitus. His disease course has been stable. There are no hypoglycemic associated symptoms. Pertinent negatives for hypoglycemia include no headaches. Pertinent negatives for diabetes include no blurred vision and no chest pain. There are no hypoglycemic complications. Diabetic complications include nephropathy. Risk factors for coronary artery disease include diabetes mellitus, dyslipidemia, hypertension and male sex.  Hypertension  This is a chronic problem. The current episode started more than 1 year ago. The problem has been gradually improving since onset. Pertinent negatives include no blurred vision, chest pain, headaches, palpitations or shortness of breath.   He reports compliance with meds.   Past Medical History:  Diagnosis Date  . Chronic kidney disease   . Coronary artery disease   . Gout   . Hyperlipidemia   . Hypertension   . Type 2 diabetes mellitus ([facility])      Family History  Problem Relation Age of Onset  . Asthma Mother   . Heart disease Father   . Stroke Father   . Heart attack Father 3  . Cancer Sister 25  . Diabetes Brother   . Heart disease Maternal Grandmother      Current Outpatient Medications:  .  allopurinol (ZYLOPRIM) 100 MG tablet, TAKE 1/2 TABLET BY MOUTH EVERY DAY, Disp: 15 tablet, Rfl: 0 .  aspirin 81 MG tablet, Take 81 mg by mouth daily., Disp: , Rfl:  .  cholecalciferol (VITAMIN D) 400 UNITS TABS tablet, Take 5,000 Units by mouth., Disp: , Rfl:  .  Diclofenac Sodium 3 % GEL, Place onto the skin., Disp: , Rfl:  .  Flaxseed, Linseed, (FLAX SEEDS PO), Take 1 tablet by mouth daily., Disp: , Rfl:  .  ketoconazole (NIZORAL) 2 % cream, Apply 1 application topically daily., Disp: , Rfl:  .   lidocaine (LIDODERM) 5 %, Place 1 patch onto the skin daily. Remove & Discard patch within 12 hours or as directed by MD Place over left lower back. (Patient not taking: Reported on 11/08/2018), Disp: 30 patch, Rfl: 0 .  Linaclotide (LINZESS) 290 MCG CAPS capsule, Take 290 mcg by mouth daily., Disp: , Rfl:  .  metoprolol succinate (TOPROL-XL) 50 MG 24 hr tablet, TAKE 1 TABLET BY MOUTH ONCE DAILY, Disp: 30 tablet, Rfl: 5 .  rosuvastatin (CRESTOR) 10 MG tablet, Take 10 mg by mouth daily., Disp: , Rfl:  .  saxagliptin HCl (ONGLYZA) 2.5 MG TABS tablet, Take 2.5 mg by mouth daily., Disp: , Rfl:  .  Semaglutide (OZEMPIC, 0.25 OR 0.5 MG/DOSE, [place]), Inject 0.5 mg into the skin once a week., Disp: , Rfl:  .  sildenafil (VIAGRA) 50 MG tablet, Take 1 tablet (50 mg total) by mouth daily as needed for erectile dysfunction., Disp: 30 tablet, Rfl: 0 .  telmisartan (MICARDIS) 20 MG tablet, TAKE 1 TABLET BY MOUTH ONCE DAILY, Disp: 90 tablet, Rfl: 0   No Known Allergies   Review of Systems  Constitutional: Negative.   Eyes: Negative for blurred vision.  Respiratory: Negative.  Negative for shortness of breath.   Cardiovascular: Negative.  Negative for chest pain and palpitations.  Neurological: Negative.  Negative for headaches.  Psychiatric/Behavioral: Negative.      Today's Vitals  11/08/18 1205  BP: 132/70  Pulse: 71  Temp: 97.9 F (36.6 C)  TempSrc: Oral  Weight: 164 lb 12.8 oz (74.8 kg)  Height: 5' 11" (1.803 m)  PainSc: 0-No pain   Body mass index is 22.98 kg/m.   Objective:  Physical Exam Vitals signs and nursing note reviewed.  Constitutional:      Appearance: Normal appearance.  HENT:     Head: Normocephalic and atraumatic.  Cardiovascular:     Rate and Rhythm: Normal rate and regular rhythm.     Heart sounds: Normal heart sounds.  Pulmonary:     Effort: Pulmonary effort is normal.     Breath sounds: Normal breath sounds.  Neurological:     General: No focal deficit present.      Mental Status: He is alert and oriented to person, place, and time.  Psychiatric:        Mood and Affect: Mood normal.         Assessment And Plan:     1. Diabetes mellitus with stage 3 chronic kidney disease ([place])  I will check labs as listed below. He is encouraged to incorporate more exercise into his daily routine.  He will rto in four months for re-evaluation.   - CMP14+EGFR - Hemoglobin A1c - Lipid Profile - POCT Urinalysis Dipstick (81002) - POCT UA - Microalbumin  2. Chronic renal disease, stage III ([facility])  Chronic.   3. Benign hypertensive heart and renal disease  Fair control. He will continue with current meds. He is encouraged to avoid adding salt to his foods.   4. Coronary artery disease involving native coronary artery of native heart without angina pectoris  Chronic, yet stable. He is also followed by Cardiology.   5. Drug therapy   [name], MD

## 2018-11-12 ENCOUNTER — Other Ambulatory Visit: Payer: Self-pay | Admitting: Internal Medicine

## 2018-11-18 NOTE — Progress Notes (Signed)
Your kidney fxn is stable. Be sure to stay well hydrated. Your liver fxn is nl. Your hba1c is 6.8, this is great. Your chol is great as well! Happy new year!

## 2018-12-12 ENCOUNTER — Other Ambulatory Visit: Payer: Self-pay | Admitting: Internal Medicine

## 2019-03-14 ENCOUNTER — Ambulatory Visit: Payer: Medicare Other | Admitting: Internal Medicine

## 2019-03-14 ENCOUNTER — Encounter: Payer: Self-pay | Admitting: Internal Medicine

## 2019-03-14 ENCOUNTER — Other Ambulatory Visit: Payer: Self-pay

## 2019-03-14 VITALS — BP 116/74 | HR 62 | Temp 98.3°F | Wt 165.0 lb

## 2019-03-14 DIAGNOSIS — N183 Chronic kidney disease, stage 3 unspecified: Secondary | ICD-10-CM

## 2019-03-14 DIAGNOSIS — I1310 Hypertensive heart and chronic kidney disease without heart failure, with stage 1 through stage 4 chronic kidney disease, or unspecified chronic kidney disease: Secondary | ICD-10-CM | POA: Diagnosis not present

## 2019-03-14 DIAGNOSIS — E1122 Type 2 diabetes mellitus with diabetic chronic kidney disease: Secondary | ICD-10-CM

## 2019-03-14 DIAGNOSIS — M1A30X0 Chronic gout due to renal impairment, unspecified site, without tophus (tophi): Secondary | ICD-10-CM

## 2019-03-14 DIAGNOSIS — I2510 Atherosclerotic heart disease of native coronary artery without angina pectoris: Secondary | ICD-10-CM

## 2019-03-14 NOTE — Progress Notes (Signed)
Subjective:     Patient ID: Jose Ponce , male    DOB: 02-04-1941 , 78 y.o.   MRN: [id number]   Chief Complaint  Patient presents with  . Diabetes  . Hypertension    HPI  Diabetes  He presents for his follow-up diabetic visit. He has type 2 diabetes mellitus. His disease course has been stable. There are no hypoglycemic associated symptoms. Pertinent negatives for hypoglycemia include no headaches. Pertinent negatives for diabetes include no blurred vision and no chest pain. There are no hypoglycemic complications. Diabetic complications include nephropathy. Risk factors for coronary artery disease include diabetes mellitus, dyslipidemia, hypertension and male sex.  Hypertension  This is a chronic problem. The current episode started more than 1 year ago. The problem has been gradually improving since onset. Pertinent negatives include no blurred vision, chest pain, headaches, palpitations or shortness of breath.     Past Medical History:  Diagnosis Date  . Chronic kidney disease   . Coronary artery disease   . Gout   . Hyperlipidemia   . Hypertension   . Type 2 diabetes mellitus ([facility])      Family History  Problem Relation Age of Onset  . Asthma Mother   . Heart disease Father   . Stroke Father   . Heart attack Father 51  . Cancer Sister 63  . Diabetes Brother   . Heart disease Maternal Grandmother      Current Outpatient Medications:  .  allopurinol (ZYLOPRIM) 100 MG tablet, TAKE 1/2 TABLET BY MOUTH EVERY DAY, Disp: 45 tablet, Rfl: 1 .  aspirin 81 MG tablet, Take 81 mg by mouth daily., Disp: , Rfl:  .  Diclofenac Sodium 3 % GEL, Place onto the skin., Disp: , Rfl:  .  Flaxseed, Linseed, (FLAX SEEDS PO), Take 1 tablet by mouth daily., Disp: , Rfl:  .  ketoconazole (NIZORAL) 2 % cream, Apply 1 application topically daily., Disp: , Rfl:  .  lidocaine (LIDODERM) 5 %, Place 1 patch onto the skin daily. Remove & Discard patch within 12 hours or as directed by MD Place over  left lower back., Disp: 30 patch, Rfl: 0 .  Linaclotide (LINZESS) 290 MCG CAPS capsule, Take 290 mcg by mouth daily., Disp: , Rfl:  .  metoprolol succinate (TOPROL-XL) 50 MG 24 hr tablet, TAKE 1 TABLET BY MOUTH ONCE DAILY, Disp: 30 tablet, Rfl: 5 .  rosuvastatin (CRESTOR) 10 MG tablet, TAKE 1 TABLET BY MOUTH EVERY DAY, Disp: 90 tablet, Rfl: 1 .  Semaglutide (OZEMPIC, 0.25 OR 0.5 MG/DOSE, [place]), Inject 0.25 mg into the skin once a week. , Disp: , Rfl:  .  sildenafil (VIAGRA) 50 MG tablet, Take 1 tablet (50 mg total) by mouth daily as needed for erectile dysfunction., Disp: 30 tablet, Rfl: 0 .  telmisartan (MICARDIS) 20 MG tablet, TAKE 1 TABLET BY MOUTH ONCE DAILY, Disp: 90 tablet, Rfl: 0 .  cholecalciferol (VITAMIN D) 400 UNITS TABS tablet, Take 5,000 Units by mouth., Disp: , Rfl:    No Known Allergies   Review of Systems  Constitutional: Negative.   Eyes: Negative for blurred vision.  Respiratory: Negative.  Negative for shortness of breath.   Cardiovascular: Negative.  Negative for chest pain and palpitations.  Gastrointestinal: Negative.   Neurological: Negative.  Negative for headaches.  Psychiatric/Behavioral: Negative.      Today's Vitals   03/14/19 1210  BP: 116/74  Pulse: 62  Temp: 98.3 F (36.8 C)  TempSrc: Oral  Weight: 165 lb (  74.8 kg)  PainSc: 0-No pain   Body mass index is 23.01 kg/m.   Objective:  Physical Exam Vitals signs and nursing note reviewed.  Constitutional:      Appearance: Normal appearance.  Cardiovascular:     Rate and Rhythm: Normal rate and regular rhythm.     Heart sounds: Normal heart sounds.  Pulmonary:     Effort: Pulmonary effort is normal.     Breath sounds: Normal breath sounds.  Skin:    General: Skin is warm.  Neurological:     General: No focal deficit present.     Mental Status: He is alert.  Psychiatric:        Mood and Affect: Mood normal.         Assessment And Plan:     1. Diabetes mellitus with stage 3 chronic kidney  disease ([place])  I will check labs as listed below.  Due to CKD stage 3, I will check SPEP and phosphorus levels. He is encouraged to stay well hydrated.   - CMP14+EGFR - Hemoglobin A1c - Protein electrophoresis, serum - Phosphorus  2. Benign hypertensive heart and renal disease  Well controlled. He will continue with current meds. He is encouraged to avoid adding salt to his foods. He is encouraged to limit his salt intake.   3. Coronary artery disease involving native coronary artery of native heart without angina pectoris  Chronic, yet stable. He is encouraged to incorporate more exercise into his daily routine.   4. Chronic gout due to renal impairment without tophus, unspecified site  I will check a uric acid level today. He is encouraged to avoid foods which are known to trigger his sx.  - Uric acid    [name], MD    THE PATIENT IS ENCOURAGED TO PRACTICE SOCIAL DISTANCING DUE TO THE COVID-19 PANDEMIC.

## 2019-03-14 NOTE — Patient Instructions (Signed)
Diabetes Mellitus and Nutrition, Adult  When you have diabetes (diabetes mellitus), it is very important to have healthy eating habits because your blood sugar (glucose) levels are greatly affected by what you eat and drink. Eating healthy foods in the appropriate amounts, at about the same times every day, can help you:  · Control your blood glucose.  · Lower your risk of heart disease.  · Improve your blood pressure.  · Reach or maintain a healthy weight.  Every person with diabetes is different, and each person has different needs for a meal plan. Your health care provider may recommend that you work with a diet and nutrition specialist (dietitian) to make a meal plan that is best for you. Your meal plan may vary depending on factors such as:  · The calories you need.  · The medicines you take.  · Your weight.  · Your blood glucose, blood pressure, and cholesterol levels.  · Your activity level.  · Other health conditions you have, such as heart or kidney disease.  How do carbohydrates affect me?  Carbohydrates, also called carbs, affect your blood glucose level more than any other type of food. Eating carbs naturally raises the amount of glucose in your blood. Carb counting is a method for keeping track of how many carbs you eat. Counting carbs is important to keep your blood glucose at a healthy level, especially if you use insulin or take certain oral diabetes medicines.  It is important to know how many carbs you can safely have in each meal. This is different for every person. Your dietitian can help you calculate how many carbs you should have at each meal and for each snack.  Foods that contain carbs include:  · Bread, cereal, rice, pasta, and crackers.  · Potatoes and corn.  · Peas, beans, and lentils.  · Milk and yogurt.  · Fruit and juice.  · Desserts, such as cakes, cookies, ice cream, and candy.  How does alcohol affect me?  Alcohol can cause a sudden decrease in blood glucose (hypoglycemia),  especially if you use insulin or take certain oral diabetes medicines. Hypoglycemia can be a life-threatening condition. Symptoms of hypoglycemia (sleepiness, dizziness, and confusion) are similar to symptoms of having too much alcohol.  If your health care provider says that alcohol is safe for you, follow these guidelines:  · Limit alcohol intake to no more than 1 drink per day for nonpregnant women and 2 drinks per day for men. One drink equals 12 oz of beer, 5 oz of wine, or 1½ oz of hard liquor.  · Do not drink on an empty stomach.  · Keep yourself hydrated with water, diet soda, or unsweetened iced tea.  · Keep in mind that regular soda, juice, and other mixers may contain a lot of sugar and must be counted as carbs.  What are tips for following this plan?    Reading food labels  · Start by checking the serving size on the "Nutrition Facts" label of packaged foods and drinks. The amount of calories, carbs, fats, and other nutrients listed on the label is based on one serving of the item. Many items contain more than one serving per package.  · Check the total grams (g) of carbs in one serving. You can calculate the number of servings of carbs in one serving by dividing the total carbs by 15. For example, if a food has 30 g of total carbs, it would be equal to 2   servings of carbs.  · Check the number of grams (g) of saturated and trans fats in one serving. Choose foods that have low or no amount of these fats.  · Check the number of milligrams (mg) of salt (sodium) in one serving. Most people should limit total sodium intake to less than 2,300 mg per day.  · Always check the nutrition information of foods labeled as "low-fat" or "nonfat". These foods may be higher in added sugar or refined carbs and should be avoided.  · Talk to your dietitian to identify your daily goals for nutrients listed on the label.  Shopping  · Avoid buying canned, premade, or processed foods. These foods tend to be high in fat, sodium,  and added sugar.  · Shop around the outside edge of the grocery store. This includes fresh fruits and vegetables, bulk grains, fresh meats, and fresh dairy.  Cooking  · Use low-heat cooking methods, such as baking, instead of high-heat cooking methods like deep frying.  · Cook using healthy oils, such as olive, canola, or sunflower oil.  · Avoid cooking with butter, cream, or high-fat meats.  Meal planning  · Eat meals and snacks regularly, preferably at the same times every day. Avoid going long periods of time without eating.  · Eat foods high in fiber, such as fresh fruits, vegetables, beans, and whole grains. Talk to your dietitian about how many servings of carbs you can eat at each meal.  · Eat 4-6 ounces (oz) of lean protein each day, such as lean meat, chicken, fish, eggs, or tofu. One oz of lean protein is equal to:  ? 1 oz of meat, chicken, or fish.  ? 1 egg.  ? ¼ cup of tofu.  · Eat some foods each day that contain healthy fats, such as avocado, nuts, seeds, and fish.  Lifestyle  · Check your blood glucose regularly.  · Exercise regularly as told by your health care provider. This may include:  ? 150 minutes of moderate-intensity or vigorous-intensity exercise each week. This could be brisk walking, biking, or water aerobics.  ? Stretching and doing strength exercises, such as yoga or weightlifting, at least 2 times a week.  · Take medicines as told by your health care provider.  · Do not use any products that contain nicotine or tobacco, such as cigarettes and e-cigarettes. If you need help quitting, ask your health care provider.  · Work with a counselor or diabetes educator to identify strategies to manage stress and any emotional and social challenges.  Questions to ask a health care provider  · Do I need to meet with a diabetes educator?  · Do I need to meet with a dietitian?  · What number can I call if I have questions?  · When are the best times to check my blood glucose?  Where to find more  information:  · American Diabetes Association: diabetes.org  · Academy of Nutrition and Dietetics: www.eatright.org  · National Institute of Diabetes and Digestive and Kidney Diseases (NIH): www.niddk.nih.gov  Summary  · A healthy meal plan will help you control your blood glucose and maintain a healthy lifestyle.  · Working with a diet and nutrition specialist (dietitian) can help you make a meal plan that is best for you.  · Keep in mind that carbohydrates (carbs) and alcohol have immediate effects on your blood glucose levels. It is important to count carbs and to use alcohol carefully.  This information is not intended to   replace advice given to you by your health care provider. Make sure you discuss any questions you have with your health care provider.  Document Released: 08/04/2005 Document Revised: 06/07/2017 Document Reviewed: 12/12/2016  Elsevier Interactive Patient Education © 2019 Elsevier Inc.

## 2019-03-15 LAB — CMP14+EGFR
ALT: 20 IU/L (ref 0–44)
AST: 35 IU/L (ref 0–40)
Albumin/Globulin Ratio: 1.5 (ref 1.2–2.2)
Albumin: 4.3 g/dL (ref 3.7–4.7)
Alkaline Phosphatase: 58 IU/L (ref 39–117)
BUN/Creatinine Ratio: 18 (ref 10–24)
BUN: 21 mg/dL (ref 8–27)
Bilirubin Total: 0.7 mg/dL (ref 0.0–1.2)
CO2: 21 mmol/L (ref 20–29)
Calcium: 9.9 mg/dL (ref 8.6–10.2)
Chloride: 104 mmol/L (ref 96–106)
Creatinine, Ser: 1.2 mg/dL (ref 0.76–1.27)
GFR calc Af Amer: 67 mL/min/{1.73_m2} (ref >59)
GFR calc non Af Amer: 58 mL/min/{1.73_m2} — ABNORMAL LOW (ref >59)
Globulin, Total: 2.9 g/dL (ref 1.5–4.5)
Glucose: 162 mg/dL — ABNORMAL HIGH (ref 65–99)
Potassium: 4.4 mmol/L (ref 3.5–5.2)
Sodium: 140 mmol/L (ref 134–144)
Total Protein: 7.2 g/dL (ref 6.0–8.5)

## 2019-03-15 LAB — PROTEIN ELECTROPHORESIS, SERUM
A/G Ratio: 1.2 (ref 0.7–1.7)
Albumin ELP: 3.9 g/dL (ref 2.9–4.4)
Alpha 1: 0.2 g/dL (ref 0.0–0.4)
Alpha 2: 0.8 g/dL (ref 0.4–1.0)
Beta: 1.1 g/dL (ref 0.7–1.3)
Gamma Globulin: 1.3 g/dL (ref 0.4–1.8)
Globulin, Total: 3.3 g/dL (ref 2.2–3.9)

## 2019-03-15 LAB — URIC ACID: Uric Acid: 5.7 mg/dL (ref 3.7–8.6)

## 2019-03-15 LAB — HEMOGLOBIN A1C
Est. average glucose Bld gHb Est-mCnc: 166 mg/dL
Hgb A1c MFr Bld: 7.4 % — ABNORMAL HIGH (ref 4.8–5.6)

## 2019-03-15 LAB — PHOSPHORUS: Phosphorus: 3.5 mg/dL (ref 2.8–4.1)

## 2019-03-19 ENCOUNTER — Other Ambulatory Visit: Payer: Self-pay | Admitting: Internal Medicine

## 2019-04-03 ENCOUNTER — Telehealth: Payer: Medicare Other | Admitting: Cardiovascular Disease

## 2019-04-04 ENCOUNTER — Telehealth: Payer: Medicare Other | Admitting: Cardiovascular Disease

## 2019-04-29 ENCOUNTER — Other Ambulatory Visit: Payer: Self-pay | Admitting: Internal Medicine

## 2019-05-04 ENCOUNTER — Other Ambulatory Visit: Payer: Self-pay | Admitting: Internal Medicine

## 2019-05-08 ENCOUNTER — Telehealth: Payer: Self-pay

## 2019-05-08 ENCOUNTER — Other Ambulatory Visit: Payer: Self-pay

## 2019-05-08 ENCOUNTER — Ambulatory Visit: Payer: Self-pay

## 2019-05-08 ENCOUNTER — Ambulatory Visit (INDEPENDENT_AMBULATORY_CARE_PROVIDER_SITE_OTHER): Payer: Medicare Other | Admitting: Pharmacist

## 2019-05-08 DIAGNOSIS — E1122 Type 2 diabetes mellitus with diabetic chronic kidney disease: Secondary | ICD-10-CM

## 2019-05-08 DIAGNOSIS — I2510 Atherosclerotic heart disease of native coronary artery without angina pectoris: Secondary | ICD-10-CM

## 2019-05-08 DIAGNOSIS — N183 Chronic kidney disease, stage 3 unspecified: Secondary | ICD-10-CM

## 2019-05-08 DIAGNOSIS — I1310 Hypertensive heart and chronic kidney disease without heart failure, with stage 1 through stage 4 chronic kidney disease, or unspecified chronic kidney disease: Secondary | ICD-10-CM

## 2019-05-08 MED ORDER — OZEMPIC (0.25 OR 0.5 MG/DOSE) 2 MG/1.5ML ~~LOC~~ SOPN: 0.2500 mg | PEN_INJECTOR | SUBCUTANEOUS | 3 refills | Status: DC

## 2019-05-08 NOTE — Chronic Care Management (AMB) (Signed)
  Care Management Note   Jose Ponce is a 78 y.o. year old male who is a primary care patient of Glendale Chard, MD . The CM team was consulted for assistance with chronic care management.  Review of patient status, including review of consultants reports, rand collaboration with appropriate care team members and the patient's provider was performed as part of comprehensive patient evaluation and provision of care management services. Telephone outreach to patient today to introduce CM services.   I reached out to [name] by phone today.   [name] was given information about Chronic Care Management services today including:  1. CCM service includes personalized support from designated clinical staff supervised by his physician, including individualized plan of care and coordination with other care providers 2. 24/7 contact phone numbers for assistance for urgent and routine care needs. 3. Service will only be billed when office clinical staff spend 20 minutes or more in a month to coordinate care. 4. Only one practitioner may furnish and bill the service in a calendar month. 5. The patient may stop CCM services at any time (effective at the end of the month) by phone call to the office staff. 6. The patient will be responsible for cost sharing (co-pay) of up to 20% of the service fee (after annual deductible is met).   Patient did not agree to services and wishes to be contacted at a later time. "I just spoke with a pharmacist today and I need to talk about this later when I may be in a better mood to talk".  Follow Up Plan: SW will follow up with patient by phone over the next week.   Daneen Schick, BSW, CDP Social Worker, Certified Dementia Practitioner [facility] [phone]

## 2019-05-08 NOTE — Telephone Encounter (Signed)
Called pt to inform him to pick his rx for ozempic up from the pharm and that it would be $80 for a 3 month supply and for him to see how much it is for a 1 month supply.

## 2019-05-13 NOTE — Patient Instructions (Signed)
Visit Information  Goals Addressed            This Visit's Progress     Patient Stated   . I would like to apply for financial assistance for Ozempic (pt-stated)       Current Barriers:  . Financial Barriers . Unable to self-inject Ozempic  Pharmacist Clinical Goal(s):  [name] Over the next 30 days, patient will demonstrate Improved medication adherence as evidenced by attempting to self inject Ozempic . Over the next 30 days, patient will work with CCM PharmD to address needs related to applying for patient assistance (Ozempic)  Interventions: . Comprehensive medication review performed. . Discussed plans with patient for ongoing care management follow up and provided patient with direct contact information for care management team . Patient tolerating Ozempic well.  He denies adverse events of hypoglycemia.  He is unable to inject Ozempic (patient fearful of self-injecting) and comes into the office to have Ozempic injection every Wednesday.  Will provide additional counseling at visit next Wednesday about self-injecting techniques.  Will continue to work with patient. . Patient filled out forms in the office today.  He will need to bring financial information next Wednesday to complete application.  Will fax application at that time. . Last A1c was 7.4 on 03/14/19 (up from 6/8).  Patient states that patient assistance will allow him to maintain blood sugars within goal range.  He reports most FBG in the 100s, Denies hypoglycemia.  Encouraged patient to adhere to diabetes-health diet and exercise as able and as recommended by PCP. . Diabetic patient on aspirin/statin for prevention as appropriate.  Fill history reviewed. . Will follow up with patient at next visit on Wednesday, 05/15/19  Patient Self Care Activities:  . Currently UNABLE TO independently self inject Ozempic at this time . Attends all scheduled provider appointments . Calls pharmacy for medication refills  Initial goal  documentation        The patient verbalized understanding of instructions provided today and declined a print copy of patient instruction materials.   The care management team will reach out to the patient again over the next 7 days.   Regina Eck, PharmD, BCPS Clinical Pharmacist, [facility] [place]: [phone]

## 2019-05-13 NOTE — Chronic Care Management (AMB) (Signed)
Chronic Care Management   Initial Visit Note  05/08/2019 Name: Jose Ponce MRN: [id number] DOB: [birth year]  Referred by: Glendale Chard, MD Reason for referral : Chronic Care Management   Jose Ponce is a 78 y.o. year old male who is a primary care patient of Glendale Chard, MD. The CCM team was consulted for assistance with chronic disease management and care coordination needs.   Review of patient status, including review of consultants reports, relevant laboratory and other test results, and collaboration with appropriate care team members and the patient's provider was performed as part of comprehensive patient evaluation and provision of chronic care management services.    I met with Jose Ponce face to face in clinic today.  Objective:   Goals Addressed            This Visit's Progress     Patient Stated   . I would like to apply for financial assistance for Ozempic (pt-stated)       Current Barriers:  . Financial Barriers . Unable to self-inject Ozempic  Pharmacist Clinical Goal(s):  [name] Over the next 30 days, patient will demonstrate Improved medication adherence as evidenced by attempting to self inject Ozempic . Over the next 30 days, patient will work with CCM PharmD to address needs related to applying for patient assistance (Ozempic)  Interventions: . Comprehensive medication review performed. . Discussed plans with patient for ongoing care management follow up and provided patient with direct contact information for care management team . Patient tolerating Ozempic well.  He denies adverse events of hypoglycemia.  He is unable to inject Ozempic (patient fearful of self-injecting) and comes into the office to have Ozempic injection every Wednesday.  Will provide additional counseling at visit next Wednesday about self-injecting techniques.  Will continue to work with patient. . Patient filled out forms in the office today.  He will need to bring financial  information next Wednesday to complete application.  Will fax application at that time.  Provider portion received. . Last A1c was 7.4 on 03/14/19 (up from 6/8).  Patient states that patient assistance will allow him to maintain blood sugars within goal range.  He reports most FBG in the 100s, Denies hypoglycemia.  Encouraged patient to adhere to diabetes-health diet and exercise as able and as recommended by PCP. . Diabetic patient on aspirin/statin for prevention as appropriate.  Fill history reviewed. . Will follow up with patient at next visit on Wednesday, 05/15/19  Patient Self Care Activities:  . Currently UNABLE TO independently self inject Ozempic at this time . Attends all scheduled provider appointments . Calls pharmacy for medication refills  Initial goal documentation      Jose Ponce was given information about Chronic Care Management services today including:  1. CCM service includes personalized support from designated clinical staff supervised by his physician, including individualized plan of care and coordination with other care providers 2. 24/7 contact phone numbers for assistance for urgent and routine care needs. 3. Service will only be billed when office clinical staff spend 20 minutes or more in a month to coordinate care. 4. Only one practitioner may furnish and bill the service in a calendar month. 5. The patient may stop CCM services at any time (effective at the end of the month) by phone call to the office staff. 6. The patient will be responsible for cost sharing (co-pay) of up to 20% of the service fee (after annual deductible is met).  Patient agreed to services and  verbal consent obtained.   Plan:   The care management team will reach out to the patient again over the next 7  days.   Regina Eck, PharmD, BCPS Clinical Pharmacist, [facility] [place]: [phone]

## 2019-05-15 ENCOUNTER — Ambulatory Visit: Payer: Self-pay | Admitting: Pharmacist

## 2019-05-15 DIAGNOSIS — I1310 Hypertensive heart and chronic kidney disease without heart failure, with stage 1 through stage 4 chronic kidney disease, or unspecified chronic kidney disease: Secondary | ICD-10-CM

## 2019-05-15 DIAGNOSIS — N183 Chronic kidney disease, stage 3 unspecified: Secondary | ICD-10-CM

## 2019-05-15 DIAGNOSIS — I2510 Atherosclerotic heart disease of native coronary artery without angina pectoris: Secondary | ICD-10-CM

## 2019-05-15 DIAGNOSIS — E1122 Type 2 diabetes mellitus with diabetic chronic kidney disease: Secondary | ICD-10-CM

## 2019-05-15 NOTE — Patient Instructions (Signed)
Visit Information  Goals Addressed            This Visit's Progress     Patient Stated   . I would like to apply for financial assistance for Ozempic (pt-stated)       Current Barriers:  . Financial Barriers . Unable to self-inject Ozempic  Pharmacist Clinical Goal(s):  [name] Over the next 60 days, patient will demonstrate Improved medication adherence as evidenced by attempting to self inject Ozempic . Over the next 30 days, patient will work with CCM PharmD to address needs related to applying for patient assistance (Ozempic)  Interventions: . Comprehensive medication review performed. . Discussed plans with patient for ongoing care management follow up and provided patient with direct contact information for care management team . Patient continues to tolerate Ozempic without adverse events.  He denies adverse events of hypoglycemia.  He is unable to inject Ozempic (patient fearful of self-injecting) and comes into the office to have Ozempic injection every Wednesday.  Patient unable to self-inject Ozempic at this time.  Will continue to provide additional counseling at visit next Wednesday about self-injecting techniques.   . Patient assistance application has been filled our by patient and provider.  He did not bring in financial information to complete application.  Encouraged patient to bring in financials (SS benefits letter) to complete application.  Will fax application at that time. . Last A1c was 7.4 on 03/14/19 (up from 6.8).  Patient states that patient assistance will allow him to maintain blood sugars within goal range.  He reports most FBG in the 100s, denies hypoglycemia.  Encouraged patient to adhere to diabetes-healthy diet and exercise as able and as recommended by PCP. . Diabetic patient on aspirin/statin for prevention as appropriate.  Fill history reviewed. . Will follow up with patient at next visit on Wednesday, 05/22/19  Patient Self Care Activities:  . Currently  UNABLE TO independently self inject Ozempic at this time . Attends all scheduled provider appointments . Calls pharmacy for medication refills  Please see past updates related to this goal by clicking on the "Past Updates" button in the selected goal         The patient verbalized understanding of instructions provided today and declined a print copy of patient instruction materials.   The care management team will reach out to the patient again over the next 7 days.   Regina Eck, PharmD, BCPS Clinical Pharmacist, [facility] [place]: [phone]

## 2019-05-15 NOTE — Progress Notes (Signed)
  Chronic Care Management   Visit Note  05/15/2019 Name: Jose Ponce MRN: [id number] DOB: [birth year]  Referred by: Glendale Chard, MD Reason for referral : Chronic Care Management   Jose Ponce is a 78 y.o. year old male who is a primary care patient of Glendale Chard, MD. The CCM team was consulted for assistance with chronic disease management and care coordination needs.   Review of patient status, including review of consultants reports, relevant laboratory and other test results, and collaboration with appropriate care team members and the patient's provider was performed as part of comprehensive patient evaluation and provision of chronic care management services.    I met with Mr. Koy today in clinic for a face to face visit.  Objective:   Goals Addressed            This Visit's Progress     Patient Stated   . I would like to apply for financial assistance for Ozempic (pt-stated)       Current Barriers:  . Financial Barriers . Unable to self-inject Ozempic  Pharmacist Clinical Goal(s):  [name] Over the next 60 days, patient will demonstrate Improved medication adherence as evidenced by attempting to self inject Ozempic . Over the next 30 days, patient will work with CCM PharmD to address needs related to applying for patient assistance (Ozempic)  Interventions: . Comprehensive medication review performed. . Discussed plans with patient for ongoing care management follow up and provided patient with direct contact information for care management team . Patient continues to tolerate Ozempic without adverse events.  He denies adverse events of hypoglycemia.  He is unable to inject Ozempic (patient fearful of self-injecting) and comes into the office to have Ozempic injection every Wednesday.  Patient unable to self-inject Ozempic at this time.  Will continue to provide additional counseling at visit next Wednesday about self-injecting techniques.   . Patient assistance  application has been filled our by patient and provider.  He did not bring in financial information to complete application.  Encouraged patient to bring in financials (SS benefits letter) to complete application.  Will fax application at that time. . Last A1c was 7.4 on 03/14/19 (up from 6.8).  Patient states that patient assistance will allow him to maintain blood sugars within goal range.  He reports most FBG in the 100s, denies hypoglycemia.  Encouraged patient to adhere to diabetes-healthy diet and exercise as able and as recommended by PCP. . Diabetic patient on aspirin/statin for prevention as appropriate.  Fill history reviewed. . Will follow up with patient at next visit on Wednesday, 05/22/19  Patient Self Care Activities:  . Currently UNABLE TO independently self inject Ozempic at this time . Attends all scheduled provider appointments . Calls pharmacy for medication refills  Please see past updates related to this goal by clicking on the "Past Updates" button in the selected goal          Plan:   The care management team will reach out to the patient again over the next 7 days.   Regina Eck, PharmD, BCPS Clinical Pharmacist, [facility] [place]: [phone]

## 2019-05-16 ENCOUNTER — Telehealth: Payer: Self-pay

## 2019-05-21 ENCOUNTER — Telehealth: Payer: Self-pay | Admitting: Internal Medicine

## 2019-05-21 ENCOUNTER — Telehealth: Payer: Self-pay

## 2019-05-21 NOTE — Telephone Encounter (Signed)
I called the patient to ask him if he can come at 11:15 instead of 11:45 on 06/13/2019 [facility] Calendar Year).  He said that he's on the way to the office to bring a form and will make a decision when he gets there. If patient agrees, please reschedule 11/28/2019 AWV to 06/13/2019 so that he will have AWV for 2020 calendar year. VDM (DD)

## 2019-05-22 ENCOUNTER — Other Ambulatory Visit: Payer: Self-pay | Admitting: Pharmacy Technician

## 2019-05-22 ENCOUNTER — Ambulatory Visit: Payer: Medicare Other | Admitting: Pharmacist

## 2019-05-22 DIAGNOSIS — I1310 Hypertensive heart and chronic kidney disease without heart failure, with stage 1 through stage 4 chronic kidney disease, or unspecified chronic kidney disease: Secondary | ICD-10-CM

## 2019-05-22 DIAGNOSIS — I2510 Atherosclerotic heart disease of native coronary artery without angina pectoris: Secondary | ICD-10-CM

## 2019-05-22 DIAGNOSIS — N183 Chronic kidney disease, stage 3 unspecified: Secondary | ICD-10-CM

## 2019-05-22 DIAGNOSIS — E1122 Type 2 diabetes mellitus with diabetic chronic kidney disease: Secondary | ICD-10-CM

## 2019-05-22 NOTE — Progress Notes (Signed)
  Chronic Care Management   Visit Note  05/22/2019 Name: Jose Ponce MRN: [id number] DOB: 1941/09/30  Referred by: Glendale Chard, MD Reason for referral : Chronic Care Management   Jose Ponce is a 78 y.o. year old male who is a primary care patient of Glendale Chard, MD. The CCM team was consulted for assistance with chronic disease management and care coordination needs.   Review of patient status, including review of consultants reports, relevant laboratory and other test results, and collaboration with appropriate care team members and the patient's provider was performed as part of comprehensive patient evaluation and provision of chronic care management services.     Objective:   Goals Addressed            This Visit's Progress     Patient Stated   . I would like to apply for financial assistance for Ozempic (pt-stated)       Current Barriers:  . Financial Barriers . Unable to self-inject Ozempic  Pharmacist Clinical Goal(s):  [name] Over the next 60 days, patient will demonstrate Improved medication adherence as evidenced by attempting to self inject Ozempic . Over the next 30 days, patient will work with CCM PharmD to address needs related to applying for patient assistance (Ozempic)  Interventions: . Comprehensive medication review performed. . Discussed plans with patient for ongoing care management follow up and provided patient with direct contact information for care management team . Patient continues to tolerate Ozempic 0.25mg  weekly without adverse events.  He denies adverse events of hypoglycemia.  He is unable to inject Ozempic (patient fearful of self-injecting) and comes into the office to have Ozempic injection every Wednesday.  Patient unable to self-inject Ozempic at this time.  Will continue to provide additional counseling at [facility] visits regarding self-injection techniques.   . Patient assistance application has been filled out by patient and provider.   Patient provided financial portion of application.  Collaboration with [facility] CPhT, Etter Sjogren to assist with application process.  Application has been faxed today (05/22/19) . Last A1c was 7.4 on 03/14/19 (up from 6.8).  Patient states that patient assistance will allow him to maintain blood sugars within goal range.  He reports most FBG in the 100s, denies hypoglycemia.  Encouraged patient to adhere to diabetes-healthy diet and exercise as able and as recommended by PCP. . Diabetic patient on aspirin/statin for prevention as appropriate.  Fill history reviewed. . Will follow up with patient at next visit on Wednesday, 05/29/19.  Patient Self Care Activities:  . Currently UNABLE TO independently self inject Ozempic at this time . Attends all scheduled provider appointments . Calls pharmacy for medication refills  Please see past updates related to this goal by clicking on the "Past Updates" button in the selected goal          Plan:   The care management team will reach out to the patient again over the next 7 days.    Regina Eck, PharmD, BCPS Clinical Pharmacist, [facility] [place]: [phone]

## 2019-05-22 NOTE — Patient Outreach (Signed)
[facility]) Care Management  05/22/2019  Jose Ponce Nov 10, 1941 [id number]   Received completed Ozempic application from [place]. Faxed application into [employer].  Will follow up with NN in 2-3 business days to check status of application.  Maud Deed Chana Bode [place] Certified Pharmacy Technician [place] Management Direct Dial:[phone]

## 2019-05-22 NOTE — Patient Instructions (Signed)
Visit Information  Goals Addressed            This Visit's Progress     Patient Stated   . I would like to apply for financial assistance for Ozempic (pt-stated)       Current Barriers:  . Financial Barriers . Unable to self-inject Ozempic  Pharmacist Clinical Goal(s):  [name] Over the next 60 days, patient will demonstrate Improved medication adherence as evidenced by attempting to self inject Ozempic . Over the next 30 days, patient will work with CCM PharmD to address needs related to applying for patient assistance (Ozempic)  Interventions: . Comprehensive medication review performed. . Discussed plans with patient for ongoing care management follow up and provided patient with direct contact information for care management team . Patient continues to tolerate Ozempic 0.25mg  weekly without adverse events.  He denies adverse events of hypoglycemia.  He is unable to inject Ozempic (patient fearful of self-injecting) and comes into the office to have Ozempic injection every Wednesday.  Patient unable to self-inject Ozempic at this time.  Will continue to provide additional counseling at [facility] visits regarding self-injection techniques.   . Patient assistance application has been filled our by patient and provider.  Patient provided financial portion of application.  Collaboration with [employer] CPhT, Etter Sjogren to assist with application process.  Application has been faxed . Last A1c was 7.4 on 03/14/19 (up from 6.8).  Patient states that patient assistance will allow him to maintain blood sugars within goal range.  He reports most FBG in the 100s, denies hypoglycemia.  Encouraged patient to adhere to diabetes-healthy diet and exercise as able and as recommended by PCP. . Diabetic patient on aspirin/statin for prevention as appropriate.  Fill history reviewed. . Will follow up with patient at next visit on Wednesday, 05/29/19.  Patient Self Care Activities:  . Currently UNABLE TO independently  self inject Ozempic at this time . Attends all scheduled provider appointments . Calls pharmacy for medication refills  Please see past updates related to this goal by clicking on the "Past Updates" button in the selected goal         The patient verbalized understanding of instructions provided today and declined a print copy of patient instruction materials.   The care management team will reach out to the patient again over the next 7 days.   Regina Eck, PharmD, BCPS Clinical Pharmacist, [facility] [place]: [phone]

## 2019-05-28 ENCOUNTER — Ambulatory Visit: Payer: Self-pay

## 2019-05-28 ENCOUNTER — Telehealth: Payer: Self-pay

## 2019-05-28 DIAGNOSIS — N183 Chronic kidney disease, stage 3 unspecified: Secondary | ICD-10-CM

## 2019-05-28 DIAGNOSIS — E1122 Type 2 diabetes mellitus with diabetic chronic kidney disease: Secondary | ICD-10-CM

## 2019-05-28 NOTE — Chronic Care Management (AMB) (Signed)
  Chronic Care Management   Outreach Note  05/28/2019 Name: Jose Ponce MRN: [id number] DOB: 03/04/1941  Referred by: Glendale Chard, MD Reason for referral : Care Coordination   AN unsuccessful outreach attempt was made in efforts of conducting an SDOH (social determinants of health) screen. CCM SW left a HIPAA compliant voice message requesting a return call.  Follow Up Plan: The care management team will reach out to the patient again over the next 10 days.   Daneen Schick, BSW, CDP Social Worker, Certified Dementia Practitioner [place] [facility] [place] Management [phone]  Total time spent performing care coordination and/or care management activities with the patient by phone or face to face = 5 minutes.

## 2019-05-29 ENCOUNTER — Ambulatory Visit: Payer: Self-pay | Admitting: Pharmacist

## 2019-05-29 ENCOUNTER — Other Ambulatory Visit: Payer: Self-pay | Admitting: Pharmacy Technician

## 2019-05-29 NOTE — Patient Outreach (Signed)
[facility]) Care Management  05/29/2019  BRITTNEY CARAWAY [birth year] [id number]    Follow up call placed to [employer] regarding patient assistance application(s) for Ozempic , Caryl Pina states that proof of income document sumbitted isn't legible and is missing bank letterhead.   Follow up:  Will route note to [place] to inform.  Maud Deed Chana Bode [place] Certified Pharmacy Technician [place] Management Direct [phone]

## 2019-05-29 NOTE — Progress Notes (Signed)
  Chronic Care Management   Outreach Note  05/29/2019 Name: Jose Ponce MRN: [id number] DOB: [birth year]  Referred by: Glendale Chard, MD Reason for referral : Chronic Care Management   An unsuccessful telephone outreach was attempted today. The patient was referred to the case management team by for assistance with chronic care management and care coordination.   Follow Up Plan: A HIPPA compliant phone message was left for the patient providing contact information and requesting a return call.  The care management team will reach out to the patient again over the next 3 days.   Regina Eck, PharmD, BCPS Clinical Pharmacist, [facility] [place]: [phone]

## 2019-05-30 ENCOUNTER — Telehealth: Payer: Self-pay | Admitting: Pharmacist

## 2019-06-06 ENCOUNTER — Ambulatory Visit: Payer: Self-pay

## 2019-06-06 ENCOUNTER — Telehealth: Payer: Self-pay

## 2019-06-06 DIAGNOSIS — N183 Chronic kidney disease, stage 3 unspecified: Secondary | ICD-10-CM

## 2019-06-06 DIAGNOSIS — E1122 Type 2 diabetes mellitus with diabetic chronic kidney disease: Secondary | ICD-10-CM

## 2019-06-06 DIAGNOSIS — I2510 Atherosclerotic heart disease of native coronary artery without angina pectoris: Secondary | ICD-10-CM

## 2019-06-06 NOTE — Chronic Care Management (AMB) (Signed)
  Chronic Care Management   Outreach Note  06/06/2019 Name: Jose Ponce MRN: [id number] DOB: 02-17-1941  Referred by: Glendale Chard, MD Reason for referral : Care Coordination   Second unsuccessful call attempt placed to the patient to introduce self as a member of the CCM team and complete a SDOH screening call.  Follow Up Plan: A HIPPA compliant phone message was left for the patient providing contact information and requesting a return call.  The care management team will reach out to the patient again over the next 14 days.   Daneen Schick, BSW, CDP Social Worker, Certified Dementia Practitioner [place] [facility] [place] Management [phone]  Total time spent performing care coordination and/or care management activities with the patient by phone or face to face = 3 minutes.

## 2019-06-07 ENCOUNTER — Ambulatory Visit: Payer: Self-pay

## 2019-06-07 ENCOUNTER — Telehealth: Payer: Self-pay | Admitting: Internal Medicine

## 2019-06-07 ENCOUNTER — Telehealth: Payer: Self-pay

## 2019-06-07 DIAGNOSIS — I2510 Atherosclerotic heart disease of native coronary artery without angina pectoris: Secondary | ICD-10-CM

## 2019-06-07 DIAGNOSIS — E1122 Type 2 diabetes mellitus with diabetic chronic kidney disease: Secondary | ICD-10-CM

## 2019-06-07 DIAGNOSIS — N183 Chronic kidney disease, stage 3 unspecified: Secondary | ICD-10-CM

## 2019-06-07 NOTE — Telephone Encounter (Signed)
I spoke with the patient, and he agreed to come at 11:15 on 7/23. VDM (DD)

## 2019-06-07 NOTE — Chronic Care Management (AMB) (Signed)
  Chronic Care Management   Social Work Note  06/07/2019 Name: Jose Ponce MRN: [id number] DOB: December 24, 1940  CCM SW placed a fourth unsuccessful outbound call to the patient on today's date. CCM SW had received a voice message from the patient after the third unsuccessful call attempt. CCM SW left a HIPAA compliant voice message.  Follow Up Plan: No planned follow up by CCM SW at this time. CCM SW will assist with SDOH screen and care coordination if/when the patient returns call.  Daneen Schick, BSW, CDP Social Worker, Certified Dementia Practitioner [place] [facility] [place] Management [phone]  Total time spent performing care coordination and/or care management activities with the patient by phone or face to face = 5 minutes.

## 2019-06-10 ENCOUNTER — Other Ambulatory Visit: Payer: Self-pay | Admitting: Pharmacy Technician

## 2019-06-10 NOTE — Patient Outreach (Signed)
[facility]) Care Management  06/10/2019  Jose Ponce 1941/02/15 [id number]   Successful call placed to [employer] regarding patient assistance application(s) for Ozempic , Peter Congo confirms patient has been approved as of 7/20 until 10/21/19. Medication to arrive at providers office in 10-14 business days. Order number [id number]  Follow up:  Will route note to [place] and Octavio Manns @ [facility] to inform of details.  Maud Deed Chana Bode [place] Certified Pharmacy Technician [place] Management Direct [phone]

## 2019-06-11 ENCOUNTER — Other Ambulatory Visit: Payer: Self-pay | Admitting: Internal Medicine

## 2019-06-13 ENCOUNTER — Ambulatory Visit: Payer: Medicare Other

## 2019-06-13 ENCOUNTER — Ambulatory Visit: Payer: Self-pay | Admitting: Pharmacist

## 2019-06-13 ENCOUNTER — Ambulatory Visit: Payer: Medicare Other | Admitting: Internal Medicine

## 2019-06-13 DIAGNOSIS — E1122 Type 2 diabetes mellitus with diabetic chronic kidney disease: Secondary | ICD-10-CM

## 2019-06-13 DIAGNOSIS — I2510 Atherosclerotic heart disease of native coronary artery without angina pectoris: Secondary | ICD-10-CM

## 2019-06-13 DIAGNOSIS — N183 Chronic kidney disease, stage 3 unspecified: Secondary | ICD-10-CM

## 2019-06-13 NOTE — Patient Instructions (Signed)
Visit Information  Goals Addressed            This Visit's Progress     Patient Stated   . I would like to apply for financial assistance for Ozempic & continue to manage my diabetes (pt-stated)       Current Barriers:  . Financial Barriers . Unable to self-inject Ozempic  Pharmacist Clinical Goal(s):  [name] Over the next 60 days, patient will demonstrate Improved medication adherence as evidenced by attempting to self inject Ozempic . Over the next 30 days, patient will work with CCM PharmD to address needs related to applying for patient assistance (Ozempic)  Interventions: 06/12/19 Completed face to face visit with patient in clinic . Comprehensive medication review performed. . Discussed plans with patient for ongoing care management follow up and provided patient with direct contact information for care management team . Patient continues to tolerate Ozempic 0.25mg  weekly without adverse events.  He denies adverse events of hypoglycemia.  He is unable to inject Ozempic (patient fearful of self-injecting) and comes into the office to have Ozempic injection every Wednesday.  Patient was able to inject Ozempic with the assistance of the CMA.  He was able to hold injection in stomach region for 3-5 seconds and remove.  He states, "give me some time and I will try to inject".  Plan is needed for patient to self-inject as coming to PCP office weekly may not be feasible in the near future.  Patient verbalizes understanding.  Will continue to provide support regarding self-injection techniques.  Appreciated the assistance of CMA, [place]. . Patient has been approved for Ozempic patient assistance until 11/21/19 . Last A1c was 7.4 on 03/14/19 (up from 6.8).  [name] states that patient assistance will allow him to maintain blood sugars within goal range.  He reports most FBG in the 100s, denies hypoglycemia.  Encouraged patient to adhere to diabetes-healthy diet and exercise as able and as recommended  by PCP. . Diabetic patient on aspirin/statin for prevention as appropriate.  Fill history reviewed. . Will follow up with patient at next visit on Wednesday, 06/19/19  Patient Self Care Activities:  . Currently UNABLE TO independently self inject Ozempic at this time . Attends all scheduled provider appointments . Calls pharmacy for medication refills  Please see past updates related to this goal by clicking on the "Past Updates" button in the selected goal         The patient verbalized understanding of instructions provided today and declined a print copy of patient instruction materials.   The care management team will reach out to the patient again over the next 7 days.   Regina Eck, PharmD, BCPS Clinical Pharmacist, [facility] [place]: [phone]

## 2019-06-13 NOTE — Progress Notes (Signed)
  Chronic Care Management   Visit Note  06/12/2019 Name: Jose Ponce MRN: [id number] DOB: 11/28/1940  Referred by: Glendale Chard, MD Reason for referral : Chronic Care Management   Jose Ponce is a 78 y.o. year old male who is a primary care patient of Glendale Chard, MD. The CCM team was consulted for assistance with chronic disease management and care coordination needs.   Review of patient status, including review of consultants reports, relevant laboratory and other test results, and collaboration with appropriate care team members and the patient's provider was performed as part of comprehensive patient evaluation and provision of chronic care management services.    I met with patient in clinic face to face today.  Objective:   Goals Addressed            This Visit's Progress     Patient Stated   . I would like to apply for financial assistance for Ozempic & continue to manage my diabetes (pt-stated)       Current Barriers:  . Financial Barriers . Unable to self-inject Ozempic  Pharmacist Clinical Goal(s):  [name] Over the next 60 days, patient will demonstrate Improved medication adherence as evidenced by attempting to self inject Ozempic . Over the next 30 days, patient will work with CCM PharmD to address needs related to applying for patient assistance (Ozempic)  Interventions: 06/12/19 Completed face to face visit with patient in clinic . Comprehensive medication review performed. . Discussed plans with patient for ongoing care management follow up and provided patient with direct contact information for care management team . Patient continues to tolerate Ozempic 0.39m weekly without adverse events.  He denies adverse events of hypoglycemia.  He is unable to inject Ozempic (patient fearful of self-injecting) and comes into the office to have Ozempic injection every Wednesday.  Patient was able to inject Ozempic with the assistance of the CMA.  He was able to hold  injection in stomach region for 3-5 seconds and remove.  He states, "give me some time and I will try to inject".  Plan is needed for patient to self-inject as coming to PCP office weekly may not be feasible in the near future.  Patient verbalizes understanding.  Will continue to provide support regarding self-injection techniques.  Appreciated the assistance of CMA, Tianna. . Patient has been approved for Ozempic patient assistance until 11/21/19 . Last A1c was 7.4 on 03/14/19 (up from 6.8).  [name] states that patient assistance will allow him to maintain blood sugars within goal range.  He reports most FBG in the 100s, denies hypoglycemia.  Encouraged patient to adhere to diabetes-healthy diet and exercise as able and as recommended by PCP. . Diabetic patient on aspirin/statin for prevention as appropriate.  Fill history reviewed. . Will follow up with patient at next visit on Wednesday, 06/19/19  Patient Self Care Activities:  . Currently UNABLE TO independently self inject Ozempic at this time . Attends all scheduled provider appointments . Calls pharmacy for medication refills  Please see past updates related to this goal by clicking on the "Past Updates" button in the selected goal          Plan:   The care management team will reach out to the patient again over the next 7 days.    JRegina Eck PharmD, BCPS Clinical Pharmacist, [facility] [place] [phone]

## 2019-06-14 ENCOUNTER — Telehealth: Payer: Self-pay

## 2019-06-18 ENCOUNTER — Ambulatory Visit (INDEPENDENT_AMBULATORY_CARE_PROVIDER_SITE_OTHER): Payer: Medicare Other | Admitting: Internal Medicine

## 2019-06-18 ENCOUNTER — Ambulatory Visit: Payer: Medicare Other

## 2019-06-18 ENCOUNTER — Encounter: Payer: Self-pay | Admitting: Internal Medicine

## 2019-06-18 ENCOUNTER — Other Ambulatory Visit: Payer: Self-pay

## 2019-06-18 VITALS — BP 118/68 | HR 64 | Temp 98.8°F | Ht 67.4 in | Wt 163.0 lb

## 2019-06-18 DIAGNOSIS — N183 Chronic kidney disease, stage 3 unspecified: Secondary | ICD-10-CM

## 2019-06-18 DIAGNOSIS — Z0000 Encounter for general adult medical examination without abnormal findings: Secondary | ICD-10-CM

## 2019-06-18 DIAGNOSIS — I2510 Atherosclerotic heart disease of native coronary artery without angina pectoris: Secondary | ICD-10-CM

## 2019-06-18 DIAGNOSIS — I1310 Hypertensive heart and chronic kidney disease without heart failure, with stage 1 through stage 4 chronic kidney disease, or unspecified chronic kidney disease: Secondary | ICD-10-CM | POA: Diagnosis not present

## 2019-06-18 DIAGNOSIS — E1122 Type 2 diabetes mellitus with diabetic chronic kidney disease: Secondary | ICD-10-CM

## 2019-06-18 DIAGNOSIS — R079 Chest pain, unspecified: Secondary | ICD-10-CM

## 2019-06-18 DIAGNOSIS — Z23 Encounter for immunization: Secondary | ICD-10-CM

## 2019-06-18 LAB — POCT URINALYSIS DIPSTICK
Blood, UA: NEGATIVE
Glucose, UA: NEGATIVE
Ketones, UA: NEGATIVE
Leukocytes, UA: NEGATIVE
Nitrite, UA: NEGATIVE
Protein, UA: NEGATIVE
Spec Grav, UA: 1.025 (ref 1.010–1.025)
Urobilinogen, UA: 1 E.U./dL
pH, UA: 6 (ref 5.0–8.0)

## 2019-06-18 LAB — POCT UA - MICROALBUMIN
Albumin/Creatinine Ratio, Urine, POC: 30
Creatinine, POC: 300 mg/dL
Microalbumin Ur, POC: 30 mg/L

## 2019-06-18 MED ORDER — PREVNAR 13 IM SUSP: 0.5000 mL | INTRAMUSCULAR | 0 refills | Status: AC

## 2019-06-18 MED ORDER — BOOSTRIX 5-2.5-18.5 LF-MCG/0.5 IM SUSP: 0.5000 mL | Freq: Once | INTRAMUSCULAR | 0 refills | Status: AC

## 2019-06-18 NOTE — Progress Notes (Signed)
Subjective:   Jose Ponce is a 78 y.o. male who presents for Medicare Annual/Subsequent preventive examination.  Review of Systems:  n/a Cardiac Risk Factors include: advanced age (>76men, >27 women);diabetes mellitus;dyslipidemia;hypertension;male gender     Objective:    Vitals: BP 118/68 (BP Location: Left Arm, Patient Position: Sitting, Cuff Size: Normal)    Pulse 64    Temp 98.8 F (37.1 C) (Oral)    Ht 5' 7.4" (1.712 m)    Wt 163 lb (73.9 kg)    BMI 25.23 kg/m   Body mass index is 25.23 kg/m.  Advanced Directives 06/18/2019 11/08/2018 10/09/2018 12/02/2014  Does Patient Have a Medical Advance Directive? Yes Yes No No  Type of Paramedic of [place];Living will Living will - -  Does patient want to make changes to medical advance directive? No - Patient declined - - -  Copy of [place] in Chart? No - copy requested - - -  Would patient like information on creating a medical advance directive? - - No - Patient declined No - patient declined information    Tobacco Social History   Tobacco Use  Smoking Status Former Smoker   Packs/day: 0.25   Years: 20.00   Pack years: 5.00   Types: Cigarettes   Quit date: 11/21/1984   Years since quitting: 34.5  Smokeless Tobacco Never Used     Counseling given: Not Answered   Clinical Intake:  Pre-visit preparation completed: Yes  Pain : No/denies pain     Nutritional Status: BMI 25 -29 Overweight Nutritional Risks: None Diabetes: Yes CBG done?: No Did pt. bring in CBG monitor from home?: No  How often do you need to have someone help you when you read instructions, pamphlets, or other written materials from your doctor or pharmacy?: 1 - Never What is the last grade level you completed in school?: post grad  Interpreter Needed?: No  Information entered by :: NAllen LPN  Past Medical History:  Diagnosis Date   Chronic kidney disease    Coronary artery disease     Gout    Hyperlipidemia    Hypertension    Type 2 diabetes mellitus ([place])    Past Surgical History:  Procedure Laterality Date   CARDIAC CATHETERIZATION  01/16/2004   multi-vessel coronary disease 40-50% Left main, 100% mid LAD75-80% ostial stenosis   CARDIOVASCULAR STRESS TEST  08/09/2004   post CABG, low risk scan   CARDIOVASCULAR STRESS TEST  10/23/2003   mild septal ischemia, EF43%,    CORONARY ARTERY BYPASS GRAFT  02/05/2004   CABGx3 Dr.Bartle   Lower Ext. Doppler  10/20/2011   Right Anterior Tibial: occlusive disease, Left Anterior Tibial occluded w/reconstitution at ankle, mildly abnormal LEA   NM MYOCAR PERF WALL MOTION  06/07/2011   protocol:Persantine, EF60%, negative for ischemia, low risk scan   NM MYOCAR PERF WALL MOTION  08/05/2009   protocol: Persantine, EF57%. no evidence of ischemia, low risk scan.    TRANSPERINEAL IMPLANT OF RADIATION SEEDS W/ ULTRASOUND     Family History  Problem Relation Age of Onset   Asthma Mother    Heart disease Father    Stroke Father    Heart attack Father 45   Cancer Sister 58   Diabetes Brother    Heart disease Maternal Grandmother    Social History   Socioeconomic History   Marital status: Divorced    Spouse name: Not on file   Number of children: Not on file  Years of education: Not on file   Highest education level: Not on file  Occupational History   Occupation: retired  Scientist, product/process development strain: Not hard at [employer] insecurity    Worry: Never true    Inability: Never true   Transportation needs    Medical: No    Non-medical: No  Tobacco Use   Smoking status: Former Smoker    Packs/day: 0.25    Years: 20.00    Pack years: 5.00    Types: Cigarettes    Quit date: 11/21/1984    Years since quitting: 34.5   Smokeless tobacco: Never Used  Substance and Sexual Activity   Alcohol use: Not Currently   Drug use: Not Currently   Sexual activity: Yes  Lifestyle    Physical activity    Days per week: 2 days    Minutes per session: 30 min   Stress: Not at all  Relationships   Social connections    Talks on phone: Not on file    Gets together: Not on file    Attends religious service: Not on file    Active member of club or organization: Not on file    Attends meetings of clubs or organizations: Not on file    Relationship status: Not on file  Other Topics Concern   Not on file  Social History Narrative   Not on file    Outpatient Encounter Medications as of 06/18/2019  Medication Sig   allopurinol (ZYLOPRIM) 100 MG tablet TAKE 1/2 TABLET BY MOUTH EVERY DAY   aspirin 81 MG tablet Take 81 mg by mouth daily.   cholecalciferol (VITAMIN D) 400 UNITS TABS tablet Take 5,000 Units by mouth.   Diclofenac Sodium 3 % GEL Place onto the skin.   Flaxseed, Linseed, (FLAX SEEDS PO) Take 1 tablet by mouth daily.   ketoconazole (NIZORAL) 2 % cream Apply 1 application topically daily.   lidocaine (LIDODERM) 5 % Place 1 patch onto the skin daily. Remove & Discard patch within 12 hours or as directed by MD Place over left lower back.   Linaclotide (LINZESS) 290 MCG CAPS capsule Take 290 mcg by mouth daily.   metoprolol succinate (TOPROL-XL) 50 MG 24 hr tablet TAKE 1 TABLET BY MOUTH ONCE DAILY   rosuvastatin (CRESTOR) 10 MG tablet TAKE 1 TABLET BY MOUTH EVERY DAY   Semaglutide,0.25 or 0.5MG /DOS, (OZEMPIC, 0.25 OR 0.5 MG/DOSE,) 2 MG/1.5ML SOPN Inject 0.25 mg into the skin once a week.   sildenafil (VIAGRA) 50 MG tablet Take 1 tablet (50 mg total) by mouth daily as needed for erectile dysfunction.   telmisartan (MICARDIS) 20 MG tablet TAKE 1 TABLET BY MOUTH EVERY DAY   No facility-administered encounter medications on file as of 06/18/2019.     Activities of Daily Living In your present state of health, do you have any difficulty performing the following activities: 06/18/2019 11/08/2018  Hearing? N N  Vision? N N  Difficulty concentrating or  making decisions? N N  Walking or climbing stairs? N N  Dressing or bathing? N N  Doing errands, shopping? N N  Preparing Food and eating ? N N  Using the Toilet? N N  In the past six months, have you accidently leaked urine? N N  Do you have problems with loss of bowel control? N N  Managing your Medications? N N  Managing your Finances? N N  Housekeeping or managing your Housekeeping? N N  Some recent data  might be hidden    Patient Care Team: Glendale Chard, MD as PCP - General (Internal Medicine) Lorretta Harp, MD as PCP - Cardiology (Cardiology) Lavera Guise, [facility] (Pharmacist)   Assessment:   This is a routine wellness examination for Rojelio.  Exercise Activities and Dietary recommendations Current Exercise Habits: Home exercise routine, Type of exercise: walking, Time (Minutes): 30, Frequency (Times/Week): 2, Weekly Exercise (Minutes/Week): 60  Goals     DIET - INCREASE WATER INTAKE (pt-stated)     Exercise 150 min/wk Moderate Activity (pt-stated)     I would like to apply for financial assistance for Ozempic & continue to manage my diabetes (pt-stated)     Current Barriers:   Financial Barriers  Unable to self-inject Ozempic  Pharmacist Clinical Goal(s):   Over the next 60 days, patient will demonstrate Improved medication adherence as evidenced by attempting to self inject Ozempic  Over the next 30 days, patient will work with CCM PharmD to address needs related to applying for patient assistance (Ozempic)  Interventions: 06/12/19 Completed face to face visit with patient in clinic  Comprehensive medication review performed.  Discussed plans with patient for ongoing care management follow up and provided patient with direct contact information for care management team  Patient continues to tolerate Ozempic 0.25mg  weekly without adverse events.  He denies adverse events of hypoglycemia.  He is unable to inject Ozempic (patient fearful of self-injecting)  and comes into the office to have Ozempic injection every Wednesday.  Patient was able to inject Ozempic with the assistance of the CMA.  He was able to hold injection in stomach region for 3-5 seconds and remove.  He states, "give me some time and I will try to inject".  Plan is needed for patient to self-inject as coming to PCP office weekly may not be feasible in the near future.  Patient verbalizes understanding.  Will continue to provide support regarding self-injection techniques.  Appreciated the assistance of CMA, Tian[place].  Patient has been approved for Ozempic patient assistance until 11/21/19  Last A1c was 7.4 on 03/14/19 (up from 6.8).  [name] states that patient assistance will allow him to maintain blood sugars within goal range.  He reports most FBG in the 100s, denies hypoglycemia.  Encouraged patient to adhere to diabetes-healthy diet and exercise as able and as recommended by PCP.  Diabetic patient on aspirin/statin for prevention as appropriate.  Fill history reviewed.  Will follow up with patient at next visit on Wednesday, 06/19/19  Patient Self Care Activities:   Currently UNABLE TO independently self inject Ozempic at this time  Attends all scheduled provider appointments  Calls pharmacy for medication refills  Please see past updates related to this goal by clicking on the "Past Updates" button in the selected goal         Fall Risk Fall Risk  06/18/2019 03/14/2019 11/08/2018 10/10/2018  Falls in the past year? 0 0 0 0  Risk for fall due to : Medication side effect - - -  Follow up Falls evaluation completed;Education provided;Falls prevention discussed - - -   Is the patient's home free of loose throw rugs in walkways, pet beds, electrical cords, etc?   yes      Grab bars in the bathroom? no      Handrails on the stairs?   yes      Adequate lighting?   yes  Timed Get Up and Go Performed: n/a  Depression Screen [facility] 2/9 Scores 06/18/2019 03/14/2019 11/08/2018  10/10/2018  PHQ - 2 Score 0 0 0 0  PHQ- 9 Score 0 - 0 -    Cognitive Function     6CIT Screen 06/18/2019 11/08/2018  What Year? 0 points 0 points  What month? 0 points 0 points  What time? 0 points 0 points  Count back from 20 0 points 0 points  Months in reverse 0 points 0 points  Repeat phrase 0 points 0 points  Total Score 0 0    Immunization History  Administered Date(s) Administered   Influenza-Unspecified 08/21/2018    Qualifies for Shingles Vaccine? yes  Screening Tests Health Maintenance  Topic Date Due   FOOT EXAM  12/28/1950   OPHTHALMOLOGY EXAM  [birth year]28/1950   TETANUS/TDAP  12/27/1960   PNA vac Low Risk Adult (1 of 2 - PCV13) 12/27/2006   INFLUENZA VACCINE  06/22/2019   HEMOGLOBIN A1C  09/13/2019   Cancer Screenings: Lung: Low Dose CT Chest recommended if Age 49-80 years, 30 pack-year currently smoking OR have quit w/in 15years. Patient does not qualify. Colorectal: up to date  Additional Screenings:  Hepatitis C Screening:n/a      Plan:    6 CIT was 0.  I have personally reviewed and noted the following in the patients chart:    Medical and social history  Use of alcohol, tobacco or illicit drugs   Current medications and supplements  Functional ability and status  Nutritional status  Physical activity  Advanced directives  List of other physicians  Hospitalizations, surgeries, and ER visits in previous 12 months  Vitals  Screenings to include cognitive, depression, and falls  Referrals and appointments  In addition, I have reviewed and discussed with patient certain preventive protocols, quality metrics, and best practice recommendations. A written personalized care plan for preventive services as well as general preventive health recommendations were provided to patient.     Kellie Simmering, LPN  11/30/3157

## 2019-06-18 NOTE — Progress Notes (Signed)
Subjective:     Patient ID: Jose Ponce , male    DOB: 1941/06/10 , 78 y.o.   MRN: [id number]   Chief Complaint  Patient presents with  . Diabetes  . Hypertension    HPI  Diabetes He presents for his follow-up diabetic visit. He has type 2 diabetes mellitus. His disease course has been stable. There are no hypoglycemic associated symptoms. Pertinent negatives for hypoglycemia include no headaches. Pertinent negatives for diabetes include no blurred vision and no chest pain. There are no hypoglycemic complications. Diabetic complications include nephropathy. Risk factors for coronary artery disease include diabetes mellitus, dyslipidemia, hypertension and male sex. He is following a diabetic diet. He participates in exercise three times a week. An ACE inhibitor/angiotensin II receptor blocker is being taken.  Hypertension This is a chronic problem. The current episode started more than 1 year ago. The problem has been gradually improving since onset. Pertinent negatives include no blurred vision, chest pain, headaches, palpitations or shortness of breath.     Past Medical History:  Diagnosis Date  . Chronic kidney disease   . Coronary artery disease   . Gout   . Hyperlipidemia   . Hypertension   . Type 2 diabetes mellitus (HCC)      Family History  Problem Relation Age of Onset  . Asthma Mother   . Heart disease Father   . Stroke Father   . Heart attack Father 73  . Cancer Sister 12  . Diabetes Brother   . Heart disease Maternal Grandmother      Current Outpatient Medications:  .  allopurinol (ZYLOPRIM) 100 MG tablet, TAKE 1/2 TABLET BY MOUTH EVERY DAY, Disp: 45 tablet, Rfl: 1 .  aspirin 81 MG tablet, Take 81 mg by mouth daily., Disp: , Rfl:  .  cholecalciferol (VITAMIN D) 400 UNITS TABS tablet, Take 5,000 Units by mouth., Disp: , Rfl:  .  Diclofenac Sodium 3 % GEL, Place onto the skin., Disp: , Rfl:  .  Flaxseed, Linseed, (FLAX SEEDS PO), Take 1 tablet by mouth daily.,  Disp: , Rfl:  .  ketoconazole (NIZORAL) 2 % cream, Apply 1 application topically daily., Disp: , Rfl:  .  lidocaine (LIDODERM) 5 %, Place 1 patch onto the skin daily. Remove & Discard patch within 12 hours or as directed by MD Place over left lower back., Disp: 30 patch, Rfl: 0 .  Linaclotide (LINZESS) 290 MCG CAPS capsule, Take 290 mcg by mouth daily., Disp: , Rfl:  .  metoprolol succinate (TOPROL-XL) 50 MG 24 hr tablet, TAKE 1 TABLET BY MOUTH ONCE DAILY, Disp: 30 tablet, Rfl: 5 .  pneumococcal 13-valent conjugate vaccine (PREVNAR 13) SUSP injection, Inject 0.5 mLs into the muscle tomorrow at 10 am for 1 dose., Disp: 0.5 mL, Rfl: 0 .  rosuvastatin (CRESTOR) 10 MG tablet, TAKE 1 TABLET BY MOUTH EVERY DAY, Disp: 90 tablet, Rfl: 1 .  Semaglutide,0.25 or 0.'5MG'$ /DOS, (OZEMPIC, 0.25 OR 0.5 MG/DOSE,) 2 MG/1.5ML SOPN, Inject 0.25 mg into the skin once a week., Disp: 3 pen, Rfl: 3 .  sildenafil (VIAGRA) 50 MG tablet, Take 1 tablet (50 mg total) by mouth daily as needed for erectile dysfunction., Disp: 30 tablet, Rfl: 0 .  Tdap (BOOSTRIX) 5-2.5-18.5 LF-MCG/0.5 injection, Inject 0.5 mLs into the muscle once for 1 dose., Disp: 0.5 mL, Rfl: 0 .  telmisartan (MICARDIS) 20 MG tablet, TAKE 1 TABLET BY MOUTH EVERY DAY, Disp: 90 tablet, Rfl: 0   No Known Allergies   Review of  Systems  Constitutional: Negative.   Eyes: Negative for blurred vision.  Respiratory: Negative.  Negative for shortness of breath.   Cardiovascular: Negative.  Negative for chest pain and palpitations.       He reports he Had episode of r-sided chest pain, sharp/stabbing in nature. Occurred two or three nights ago. Admits he has been eating peanuts at night. Relieved with FD-Gard and bowel movement.   Gastrointestinal: Negative.   Neurological: Negative.  Negative for headaches.  Psychiatric/Behavioral: Negative.      Today's Vitals   06/18/19 1450  BP: 118/68  Pulse: 64  Temp: 98.8 F (37.1 C)  TempSrc: Oral  Weight: 163 lb (73.9  kg)  Height: 5' 7.4" (1.712 m)  PainSc: 0-No pain   Body mass index is 25.23 kg/m.   Objective:  Physical Exam Vitals signs and nursing note reviewed.  Constitutional:      Appearance: Normal appearance.  Cardiovascular:     Rate and Rhythm: Normal rate and regular rhythm.     Heart sounds: Normal heart sounds.  Pulmonary:     Effort: Pulmonary effort is normal.     Breath sounds: Normal breath sounds.  Skin:    General: Skin is warm.  Neurological:     General: No focal deficit present.     Mental Status: He is alert.  Psychiatric:        Mood and Affect: Mood normal.         Assessment And Plan:     1. Diabetes mellitus with stage 3 chronic kidney disease (HCC)  Diabetic foot exam was performed.  I will check labs as listed below. He is encouraged to stay well hydrated. He is still skiddish regarding self administration of Ozempic once weekly. However, he is pleased with improved BS so he wishes to continue with this treatment.   - BMP8+EGFR - Hemoglobin A1c - Lipid panel  2. Benign hypertensive heart and renal disease  Well controlled. He will continue with current meds. He is encouraged to avoid adding salt to his foods.   - CBC no Diff  3. Coronary artery disease involving native coronary artery of native heart without angina pectoris  Chronic, yet stable.   4. Right-sided chest pain  Resolved. Likely due to gas. He will let me know if his sx recur. He was advised to stop eating 3 hours prior to going to bed.   [name], MD    THE PATIENT IS ENCOURAGED TO PRACTICE SOCIAL DISTANCING DUE TO THE COVID-19 PANDEMIC.

## 2019-06-18 NOTE — Patient Instructions (Addendum)
Jose Ponce , Thank you for taking time to come for your Medicare Wellness Visit. I appreciate your ongoing commitment to your health goals. Please review the following plan we discussed and let me know if I can assist you in the future.   Screening recommendations/referrals: Colonoscopy: 06/2014 Recommended yearly ophthalmology/optometry visit for glaucoma screening and checkup Recommended yearly dental visit for hygiene and checkup  Vaccinations: Influenza vaccine: 08/2018 Pneumococcal vaccine: 12/2017 Tdap vaccine: sent to pharmacy Shingles vaccine: 12/2017    Advanced directives: Please bring a copy of your POA (Power of Mood[place]) and/or Living Will to your next appointment.    Conditions/risks identified: overweight  Next appointment: 11/26/2019 at 2:30  Preventive Care 6 Years and Older, Male Preventive care refers to lifestyle choices and visits with your health care provider that can promote health and wellness. What does preventive care include?  A yearly physical exam. This is also called an annual well check.  Dental exams once or twice a year.  Routine eye exams. Ask your health care provider how often you should have your eyes checked.  Personal lifestyle choices, including:  Daily care of your teeth and gums.  Regular physical activity.  Eating a healthy diet.  Avoiding tobacco and drug use.  Limiting alcohol use.  Practicing safe sex.  Taking low doses of aspirin every day.  Taking vitamin and mineral supplements as recommended by your health care provider. What happens during an annual well check? The services and screenings done by your health care provider during your annual well check will depend on your age, overall health, lifestyle risk factors, and family history of disease. Counseling  Your health care provider may ask you questions about your:  Alcohol use.  Tobacco use.  Drug use.  Emotional well-being.  Home and relationship  well-being.  Sexual activity.  Eating habits.  History of falls.  Memory and ability to understand (cognition).  Work and work Statistician. Screening  You may have the following tests or measurements:  Height, weight, and BMI.  Blood pressure.  Lipid and cholesterol levels. These may be checked every 5 years, or more frequently if you are over 70 years old.  Skin check.  Lung cancer screening. You may have this screening every year starting at age 80 if you have a 30-pack-year history of smoking and currently smoke or have quit within the past 15 years.  Fecal occult blood test (FOBT) of the stool. You may have this test every year starting at age 4.  Flexible sigmoidoscopy or colonoscopy. You may have a sigmoidoscopy every 5 years or a colonoscopy every 10 years starting at age 44.  Prostate cancer screening. Recommendations will vary depending on your family history and other risks.  Hepatitis C blood test.  Hepatitis B blood test.  Sexually transmitted disease (STD) testing.  Diabetes screening. This is done by checking your blood sugar (glucose) after you have not eaten for a while (fasting). You may have this done every 1-3 years.  Abdominal aortic aneurysm (AAA) screening. You may need this if you are a current or former smoker.  Osteoporosis. You may be screened starting at age 84 if you are at high risk. Talk with your health care provider about your test results, treatment options, and if necessary, the need for more tests. Vaccines  Your health care provider may recommend certain vaccines, such as:  Influenza vaccine. This is recommended every year.  Tetanus, diphtheria, and acellular pertussis (Tdap, Td) vaccine. You may need a Td  booster every 10 years.  Zoster vaccine. You may need this after age 62.  Pneumococcal 13-valent conjugate (PCV13) vaccine. One dose is recommended after age 74.  Pneumococcal polysaccharide (PPSV23) vaccine. One dose is  recommended after age 77. Talk to your health care provider about which screenings and vaccines you need and how often you need them. This information is not intended to replace advice given to you by your health care provider. Make sure you discuss any questions you have with your health care provider. Document Released: 12/04/2015 Document Revised: 07/27/2016 Document Reviewed: 09/08/2015 Elsevier Interactive Patient Education  2017 [place] Prevention in the Home Falls can cause injuries. They can happen to people of all ages. There are many things you can do to make your home safe and to help prevent falls. What can I do on the outside of my home?  Regularly fix the edges of walkways and driveways and fix any cracks.  Remove anything that might make you trip as you walk through a door, such as a raised step or threshold.  Trim any bushes or trees on the path to your home.  Use bright outdoor lighting.  Clear any walking paths of anything that might make someone trip, such as rocks or tools.  Regularly check to see if handrails are loose or broken. Make sure that both sides of any steps have handrails.  Any raised decks and porches should have guardrails on the edges.  Have any leaves, snow, or ice cleared regularly.  Use sand or salt on walking paths during winter.  Clean up any spills in your garage right away. This includes oil or grease spills. What can I do in the bathroom?  Use night lights.  Install grab bars by the toilet and in the tub and shower. Do not use towel bars as grab bars.  Use non-skid mats or decals in the tub or shower.  If you need to sit down in the shower, use a plastic, non-slip stool.  Keep the floor dry. Clean up any water that spills on the floor as soon as it happens.  Remove soap buildup in the tub or shower regularly.  Attach bath mats securely with double-sided non-slip rug tape.  Do not have throw rugs and other things on  the floor that can make you trip. What can I do in the bedroom?  Use night lights.  Make sure that you have a light by your bed that is easy to reach.  Do not use any sheets or blankets that are too big for your bed. They should not hang down onto the floor.  Have a firm chair that has side arms. You can use this for support while you get dressed.  Do not have throw rugs and other things on the floor that can make you trip. What can I do in the kitchen?  Clean up any spills right away.  Avoid walking on wet floors.  Keep items that you use a lot in easy-to-reach places.  If you need to reach something above you, use a strong step stool that has a grab bar.  Keep electrical cords out of the way.  Do not use floor polish or wax that makes floors slippery. If you must use wax, use non-skid floor wax.  Do not have throw rugs and other things on the floor that can make you trip. What can I do with my stairs?  Do not leave any items on the stairs.  Make sure that there are handrails on both sides of the stairs and use them. Fix handrails that are broken or loose. Make sure that handrails are as long as the stairways.  Check any carpeting to make sure that it is firmly attached to the stairs. Fix any carpet that is loose or worn.  Avoid having throw rugs at the top or bottom of the stairs. If you do have throw rugs, attach them to the floor with carpet tape.  Make sure that you have a light switch at the top of the stairs and the bottom of the stairs. If you do not have them, ask someone to add them for you. What else can I do to help prevent falls?  Wear shoes that:  Do not have high heels.  Have rubber bottoms.  Are comfortable and fit you well.  Are closed at the toe. Do not wear sandals.  If you use a stepladder:  Make sure that it is fully opened. Do not climb a closed stepladder.  Make sure that both sides of the stepladder are locked into place.  Ask someone to  hold it for you, if possible.  Clearly mark and make sure that you can see:  Any grab bars or handrails.  First and last steps.  Where the edge of each step is.  Use tools that help you move around (mobility aids) if they are needed. These include:  Canes.  Walkers.  Scooters.  Crutches.  Turn on the lights when you go into a dark area. Replace any light bulbs as soon as they burn out.  Set up your furniture so you have a clear path. Avoid moving your furniture around.  If any of your floors are uneven, fix them.  If there are any pets around you, be aware of where they are.  Review your medicines with your doctor. Some medicines can make you feel dizzy. This can increase your chance of falling. Ask your doctor what other things that you can do to help prevent falls. This information is not intended to replace advice given to you by your health care provider. Make sure you discuss any questions you have with your health care provider. Document Released: 09/03/2009 Document Revised: 04/14/2016 Document Reviewed: 12/12/2014 Elsevier Interactive Patient Education  2017 [employer].

## 2019-06-19 ENCOUNTER — Telehealth: Payer: Self-pay

## 2019-06-19 LAB — CBC
Hematocrit: 45.2 % (ref 37.5–51.0)
Hemoglobin: 15.4 g/dL (ref 13.0–17.7)
MCH: 32 pg (ref 26.6–33.0)
MCHC: 34.1 g/dL (ref 31.5–35.7)
MCV: 94 fL (ref 79–97)
Platelets: 173 10*3/uL (ref 150–450)
RBC: 4.81 x10E6/uL (ref 4.14–5.80)
RDW: 12.5 % (ref 11.6–15.4)
WBC: 4.4 10*3/uL (ref 3.4–10.8)

## 2019-06-19 LAB — BMP8+EGFR
BUN/Creatinine Ratio: 10 (ref 10–24)
BUN: 14 mg/dL (ref 8–27)
CO2: 25 mmol/L (ref 20–29)
Calcium: 9.7 mg/dL (ref 8.6–10.2)
Chloride: 101 mmol/L (ref 96–106)
Creatinine, Ser: 1.41 mg/dL — ABNORMAL HIGH (ref 0.76–1.27)
GFR calc Af Amer: 55 mL/min/{1.73_m2} — ABNORMAL LOW (ref >59)
GFR calc non Af Amer: 48 mL/min/{1.73_m2} — ABNORMAL LOW (ref >59)
Glucose: 131 mg/dL — ABNORMAL HIGH (ref 65–99)
Potassium: 5 mmol/L (ref 3.5–5.2)
Sodium: 140 mmol/L (ref 134–144)

## 2019-06-19 LAB — LIPID PANEL
Chol/HDL Ratio: 2.4 ratio (ref 0.0–5.0)
Cholesterol, Total: 127 mg/dL (ref 100–199)
HDL: 53 mg/dL (ref >39)
LDL Calculated: 63 mg/dL (ref 0–99)
Triglycerides: 54 mg/dL (ref 0–149)
VLDL Cholesterol Cal: 11 mg/dL (ref 5–40)

## 2019-06-19 LAB — HEMOGLOBIN A1C
Est. average glucose Bld gHb Est-mCnc: 163 mg/dL
Hgb A1c MFr Bld: 7.3 % — ABNORMAL HIGH (ref 4.8–5.6)

## 2019-06-20 ENCOUNTER — Telehealth: Payer: Self-pay

## 2019-06-26 ENCOUNTER — Ambulatory Visit: Payer: Self-pay

## 2019-06-26 ENCOUNTER — Telehealth: Payer: Self-pay

## 2019-06-26 DIAGNOSIS — I1310 Hypertensive heart and chronic kidney disease without heart failure, with stage 1 through stage 4 chronic kidney disease, or unspecified chronic kidney disease: Secondary | ICD-10-CM

## 2019-06-26 DIAGNOSIS — N183 Chronic kidney disease, stage 3 unspecified: Secondary | ICD-10-CM

## 2019-06-26 DIAGNOSIS — I2510 Atherosclerotic heart disease of native coronary artery without angina pectoris: Secondary | ICD-10-CM

## 2019-06-26 DIAGNOSIS — E1122 Type 2 diabetes mellitus with diabetic chronic kidney disease: Secondary | ICD-10-CM

## 2019-06-26 NOTE — Chronic Care Management (AMB) (Signed)
  Chronic Care Management   Outreach Note  06/26/2019 Name: Jose Ponce MRN: [id number] DOB: 1941/09/25  Referred by: Glendale Chard, MD Reason for referral : Chronic Care Management (INITIAL CCM RNCM Telephone Outreach)   An unsuccessful telephone outreach was attempted today. The patient was referred to the case management team by Glendale Chard MD for assistance with chronic care management and care coordination.   Follow Up Plan: Telephone follow up appointment with care management team member scheduled for: 07/03/19  Barb Merino, RN, BSN, CCM Care Management Coordinator [place] [facility]  Direct Phone: [phone]

## 2019-07-03 ENCOUNTER — Telehealth: Payer: Self-pay

## 2019-07-04 ENCOUNTER — Telehealth: Payer: Self-pay

## 2019-07-10 ENCOUNTER — Other Ambulatory Visit: Payer: Self-pay

## 2019-07-10 ENCOUNTER — Ambulatory Visit: Payer: Self-pay

## 2019-07-10 ENCOUNTER — Ambulatory Visit (INDEPENDENT_AMBULATORY_CARE_PROVIDER_SITE_OTHER): Payer: Medicare Other | Admitting: Pharmacist

## 2019-07-10 ENCOUNTER — Telehealth: Payer: Self-pay

## 2019-07-10 DIAGNOSIS — I2510 Atherosclerotic heart disease of native coronary artery without angina pectoris: Secondary | ICD-10-CM

## 2019-07-10 DIAGNOSIS — E785 Hyperlipidemia, unspecified: Secondary | ICD-10-CM

## 2019-07-10 DIAGNOSIS — E1122 Type 2 diabetes mellitus with diabetic chronic kidney disease: Secondary | ICD-10-CM | POA: Diagnosis not present

## 2019-07-10 DIAGNOSIS — N183 Chronic kidney disease, stage 3 unspecified: Secondary | ICD-10-CM

## 2019-07-10 DIAGNOSIS — I10 Essential (primary) hypertension: Secondary | ICD-10-CM

## 2019-07-10 DIAGNOSIS — I1310 Hypertensive heart and chronic kidney disease without heart failure, with stage 1 through stage 4 chronic kidney disease, or unspecified chronic kidney disease: Secondary | ICD-10-CM

## 2019-07-12 NOTE — Chronic Care Management (AMB) (Signed)
Chronic Care Management   Initial Visit Note  07/10/2019 Name: Jose Ponce MRN: [id number] DOB: 1940/12/31  Referred by: Glendale Chard, MD Reason for referral : Chronic Care Management (#2 CCM RNCM Telephone Follow up)   Jose Ponce is a 78 y.o. year old male who is a primary care patient of Glendale Chard, MD. The CCM team was consulted for assistance with chronic disease management and care coordination needs.   Review of patient status, including review of consultants reports, relevant laboratory and other test results, and collaboration with appropriate care team members and the patient's provider was performed as part of comprehensive patient evaluation and provision of chronic care management services.    SDOH (Social Determinants of Health) screening performed today: None. See Care Plan for related entries.   I spoke with Jose Ponce today by telephone to assess for CCM RNCM needs and a care plan was established.  Medications: Outpatient Encounter Medications as of 07/10/2019  Medication Sig  . allopurinol (ZYLOPRIM) 100 MG tablet TAKE 1/2 TABLET BY MOUTH EVERY DAY  . aspirin 81 MG tablet Take 81 mg by mouth daily.  . cholecalciferol (VITAMIN D) 400 UNITS TABS tablet Take 5,000 Units by mouth.  . Diclofenac Sodium 3 % GEL Place onto the skin.  . Flaxseed, Linseed, (FLAX SEEDS PO) Take 1 tablet by mouth daily.  [name] ketoconazole (NIZORAL) 2 % cream Apply 1 application topically daily.  [name] lidocaine (LIDODERM) 5 % Place 1 patch onto the skin daily. Remove & Discard patch within 12 hours or as directed by MD Place over left lower back.  . Linaclotide (LINZESS) 290 MCG CAPS capsule Take 290 mcg by mouth daily.  . metoprolol succinate (TOPROL-XL) 50 MG 24 hr tablet TAKE 1 TABLET BY MOUTH ONCE DAILY  . rosuvastatin (CRESTOR) 10 MG tablet TAKE 1 TABLET BY MOUTH EVERY DAY  . Semaglutide,0.25 or 0.5MG /DOS, (OZEMPIC, 0.25 OR 0.5 MG/DOSE,) 2 MG/1.5ML SOPN Inject 0.25 mg into the skin once  a week.  . sildenafil (VIAGRA) 50 MG tablet Take 1 tablet (50 mg total) by mouth daily as needed for erectile dysfunction.  [name] telmisartan (MICARDIS) 20 MG tablet TAKE 1 TABLET BY MOUTH EVERY DAY   No facility-administered encounter medications on file as of 07/10/2019.      Objective:  Lab Results  Component Value Date   HGBA1C 7.3 (H) 06/18/2019   HGBA1C 7.4 (H) 03/14/2019   HGBA1C 6.8 (H) 11/08/2018   Lab Results  Component Value Date   MICROALBUR 30 06/18/2019   LDLCALC 63 06/18/2019   CREATININE 1.41 (H) 06/18/2019   BP Readings from Last 3 Encounters:  06/18/19 118/68  06/18/19 118/68  03/14/19 116/74    Goals Addressed      Patient Stated   . "I would like to learn more about portion control for my diabetes" (pt-stated)       Current Barriers:  [name] Knowledge Deficits related to disease process and Self Health Management of Diabetes . A1C 7.3 (obtained on 06/18/19)  Nurse Case Manager Clinical Goal(s):  [name] Over the next 90 days, patient will work with the CCM team to address needs related to diabetes Self Health management.  . Over the next 30 days, patient will have received and reviewed the printed DM patient educational materials; Manage Your Diabetes Zone Tool; The Diabetes Diet; Controlling Your Diabetes Using the Plate Method; s/s Hypo/Hyperglycemia  CCM RN CM  Interventions:  07/10/19 call completed with patient  . Evaluation of current treatment plan  related to diabetes and patient's adherence to plan as established by provider. . Provided education to patient re: current A1C of 7.3 obtained on 06/18/19; discussed target A1C of <7.0; discussed ADA recommendations to help lower this number including, following a low carb diabetic diet; taking diabetic medications exactly as prescribed, implementing exercise into daily routine . Reviewed medications with patient and discussed current use of Ozempic 0.25 mg weekly injection, patient continues to have injections every  Wednesday at PCP office; discussed patient is working with embedded Pharm D Lottie Dawson for financial assistance with cost of Ozempic   . Discussed plans with patient for ongoing care management follow up and provided patient with direct contact information for care management team . Provided patient with printed educational materials related to Diabetes Management Zone Tool, Meal Planning using the Plate Method, s/s hypo/hyperglycemia  . Advised patient, providing education and rationale, to check cbg before meals and record, calling the CCM team and or PCP provider for findings outside established parameters.   . Discussed next OV with PCP Dr. Baird Cancer, 09/24/19 @3 :15 PM   Patient Self Care Activities:  . Self administers medications as prescribed . Attends all scheduled provider appointments . Calls pharmacy for medication refills . Performs ADL's independently . Performs IADL's independently . Calls provider office for new concerns or questions  Initial goal documentation         Plan:   Telephone follow up appointment with care management team member scheduled for: 08/05/19  Barb Merino, RN, BSN, CCM Care Management Coordinator [place] [facility]  Direct Phone: [phone]

## 2019-07-12 NOTE — Patient Instructions (Signed)
Visit Information  Goals Addressed      Patient Stated   . "I would like to learn more about portion control for my diabetes" (pt-stated)       Current Barriers:  [name] Knowledge Deficits related to disease process and Self Health Management of Diabetes . A1C 7.3 (obtained on 06/18/19)  Nurse Case Manager Clinical Goal(s):  [name] Over the next 90 days, patient will work with the CCM team to address needs related to diabetes Self Health management.  . Over the next 30 days, patient will have received and reviewed the printed DM patient educational materials; Manage Your Diabetes Zone Tool; The Diabetes Diet; Controlling Your Diabetes Using the Plate Method; s/s Hypo/Hyperglycemia  CCM RN CM  Interventions:  07/10/19 call completed with patient  . Evaluation of current treatment plan related to diabetes and patient's adherence to plan as established by provider. . Provided education to patient re: current A1C of 7.3 obtained on 06/18/19; discussed target A1C of <7.0; discussed ADA recommendations to help lower this number including, following a low carb diabetic diet; taking diabetic medications exactly as prescribed, implementing exercise into daily routine . Reviewed medications with patient and discussed current use of Ozempic 0.25 mg weekly injection, patient continues to have injections every Wednesday at PCP office; discussed patient is working with embedded Pharm D Lottie Dawson for financial assistance with cost of Ozempic   . Discussed plans with patient for ongoing care management follow up and provided patient with direct contact information for care management team . Provided patient with printed educational materials related to Diabetes Management Zone Tool, Meal Planning using the Plate Method, s/s hypo/hyperglycemia  . Advised patient, providing education and rationale, to check cbg before meals and record, calling the CCM team and or PCP provider for findings outside established parameters.    . Discussed next OV with PCP Dr. Baird Cancer, 09/24/19 @3 :15 PM   Patient Self Care Activities:  . Self administers medications as prescribed . Attends all scheduled provider appointments . Calls pharmacy for medication refills . Performs ADL's independently . Performs IADL's independently . Calls provider office for new concerns or questions  Initial goal documentation        The patient verbalized understanding of instructions provided today and declined a print copy of patient instruction materials.   Telephone follow up appointment with care management team member scheduled for: 08/05/19  Barb Merino, RN, BSN, CCM Care Management Coordinator [place] [facility]  Direct Phone: [phone]

## 2019-07-15 NOTE — Patient Instructions (Signed)
Visit Information  Goals Addressed            This Visit's Progress     Patient Stated   . I would like to apply for financial assistance for Ozempic & continue to manage my diabetes (pt-stated)       Current Barriers:  . Financial Barriers . Unable to self-inject Ozempic  Pharmacist Clinical Goal(s):  [name] Over the next 90 days, patient will demonstrate Improved medication adherence as evidenced by attempting to self inject Ozempic (improved) . Over the next 30 days, patient will work with CCM PharmD to address needs related to applying for patient assistance (Ozempic) goal complete  Interventions: 07/10/19 Completed face to face visit with patient in clinic . Comprehensive medication review performed. . Discussed plans with patient for ongoing care management follow up and provided patient with direct contact information for care management team . Patient continues to tolerate Ozempic 0.25mg  weekly without adverse events.  He denies adverse events of hypoglycemia.  He is unable to inject Ozempic (patient fearful of self-injecting) and comes into the office to have Ozempic injection every Wednesday.  Patient was able to inject Ozempic with the assistance of the CMA.  He was able to hold injection in stomach region for 3-5 seconds and remove.  He states, "give me some time and I will try to inject".  Plan is needed for patient to self-inject as coming to PCP office weekly may not be feasible in the near future.  Patient verbalizes understanding.  Will continue to provide support regarding self-injection techniques.  Appreciated the assistance of CMA, [place]. . Patient has been approved for Ozempic patient assistance until 11/21/19 . Last A1c was 7.3 on 06/17/19 (down from 7.4).  [name] states that patient assistance will allow him to maintain blood sugars within goal range.  He reports most FBG in the 90-100s, denies hypoglycemia.  Encouraged patient to adhere to diabetes-healthy diet and  exercise as able and as recommended by PCP.  Patient verbalizes understanding. . Diabetic patient on aspirin/statin for prevention as appropriate.  Fill history reviewed.  Rosuvastatin 10mg  #90 DS last filled on 06/11/19 . Will follow up with patient next month  Patient Self Care Activities:  . Currently UNABLE TO independently self inject Ozempic at this time . Attends all scheduled provider appointments . Calls pharmacy for medication refills  Please see past updates related to this goal by clicking on the "Past Updates" button in the selected goal         The patient verbalized understanding of instructions provided today and declined a print copy of patient instruction materials.   The care management team will reach out to the patient again over the next month.  Regina Eck, PharmD, BCPS Clinical Pharmacist, [facility] [place]: [phone]

## 2019-07-15 NOTE — Progress Notes (Signed)
Chronic Care Management   Visit Note  07/10/2019 Name: Jose Ponce MRN: [id number] DOB: 1941-03-12  Referred by: [place] Chard, MD Reason for referral : Chronic Care Management   Jose Ponce is a 78 y.o. year old male who is a primary care patient of Glendale Chard, MD. The CCM team was consulted for assistance with chronic disease management and care coordination needs.   Review of patient status, including review of consultants reports, relevant laboratory and other test results, and collaboration with appropriate care team members and the patient's provider was performed as part of comprehensive patient evaluation and provision of chronic care management services.    I met with Jose Ponce in clinic today.  Medications: Outpatient Encounter Medications as of 07/10/2019  Medication Sig  . allopurinol (ZYLOPRIM) 100 MG tablet TAKE 1/2 TABLET BY MOUTH EVERY DAY  . aspirin 81 MG tablet Take 81 mg by mouth daily.  . cholecalciferol (VITAMIN D) 400 UNITS TABS tablet Take 5,000 Units by mouth.  . Diclofenac Sodium 3 % GEL Place onto the skin.  . Flaxseed, Linseed, (FLAX SEEDS PO) Take 1 tablet by mouth daily.  [name] ketoconazole (NIZORAL) 2 % cream Apply 1 application topically daily.  [name] lidocaine (LIDODERM) 5 % Place 1 patch onto the skin daily. Remove & Discard patch within 12 hours or as directed by MD Place over left lower back.  . Linaclotide (LINZESS) 290 MCG CAPS capsule Take 290 mcg by mouth daily.  . metoprolol succinate (TOPROL-XL) 50 MG 24 hr tablet TAKE 1 TABLET BY MOUTH ONCE DAILY  . rosuvastatin (CRESTOR) 10 MG tablet TAKE 1 TABLET BY MOUTH EVERY DAY  . Semaglutide,0.25 or 0.'5MG'$ /DOS, (OZEMPIC, 0.25 OR 0.5 MG/DOSE,) 2 MG/1.5ML SOPN Inject 0.25 mg into the skin once a week.  . sildenafil (VIAGRA) 50 MG tablet Take 1 tablet (50 mg total) by mouth daily as needed for erectile dysfunction.  [name] telmisartan (MICARDIS) 20 MG tablet TAKE 1 TABLET BY MOUTH EVERY DAY   No  facility-administered encounter medications on file as of 07/10/2019.      Objective:   Goals Addressed            This Visit's Progress     Patient Stated   . I would like to apply for financial assistance for Ozempic & continue to manage my diabetes (pt-stated)       Current Barriers:  . Financial Barriers . Unable to self-inject Ozempic  Pharmacist Clinical Goal(s):  [name] Over the next 90 days, patient will demonstrate Improved medication adherence as evidenced by attempting to self inject Ozempic (improved) . Over the next 30 days, patient will work with CCM PharmD to address needs related to applying for patient assistance (Ozempic) goal complete  Interventions: 07/10/19 Completed face to face visit with patient in clinic . Comprehensive medication review performed. . Discussed plans with patient for ongoing care management follow up and provided patient with direct contact information for care management team . Patient continues to tolerate Ozempic 0.'25mg'$  weekly without adverse events.  He denies adverse events of hypoglycemia.  He is unable to inject Ozempic (patient fearful of self-injecting) and comes into the office to have Ozempic injection every Wednesday.  Patient was able to inject Ozempic with the assistance of the CMA.  He was able to hold injection in stomach region for 3-5 seconds and remove.  He states, "give me some time and I will try to inject".  Plan is needed for patient to self-inject as coming to  PCP office weekly may not be feasible in the near future.  Patient verbalizes understanding.  Will continue to provide support regarding self-injection techniques.  Appreciated the assistance of CMA, Tianna. . Patient has been approved for Ozempic patient assistance until 11/21/19 . Last A1c was 7.3 on 06/17/19 (down from 7.4).  [name] states that patient assistance will allow him to maintain blood sugars within goal range.  He reports most FBG in the 90-100s, denies  hypoglycemia.  Encouraged patient to adhere to diabetes-healthy diet and exercise as able and as recommended by PCP.  Patient verbalizes understanding. . Diabetic patient on aspirin/statin for prevention as appropriate.  Fill history reviewed.  Rosuvastatin '10mg'$  #90 DS last filled on 06/11/19 . Will follow up with patient next month  Patient Self Care Activities:  . Currently UNABLE TO independently self inject Ozempic at this time . Attends all scheduled provider appointments . Calls pharmacy for medication refills  Please see past updates related to this goal by clicking on the "Past Updates" button in the selected goal          Plan:   The care management team will reach out to the patient again over the next month.  Regina Eck, PharmD, BCPS Clinical Pharmacist, [facility] [place]: [phone]

## 2019-07-17 ENCOUNTER — Ambulatory Visit: Payer: Self-pay | Admitting: Pharmacist

## 2019-07-17 DIAGNOSIS — E785 Hyperlipidemia, unspecified: Secondary | ICD-10-CM

## 2019-07-17 DIAGNOSIS — I10 Essential (primary) hypertension: Secondary | ICD-10-CM

## 2019-07-17 DIAGNOSIS — N183 Chronic kidney disease, stage 3 unspecified: Secondary | ICD-10-CM

## 2019-07-17 DIAGNOSIS — E1122 Type 2 diabetes mellitus with diabetic chronic kidney disease: Secondary | ICD-10-CM

## 2019-07-19 NOTE — Patient Instructions (Signed)
Visit Information  Goals Addressed            This Visit's Progress     Patient Stated   . I would like to apply for financial assistance for Ozempic & continue to manage my diabetes (pt-stated)       Current Barriers:  . Financial Barriers . Unable to self-inject Ozempic  Pharmacist Clinical Goal(s):  [name] Over the next 90 days, patient will demonstrate Improved medication adherence as evidenced by attempting to self inject Ozempic (improved) . Over the next 30 days, patient will work with CCM PharmD to address needs related to applying for patient assistance (Ozempic) goal complete  Interventions: 07/17/19 Completed face to face visit with patient in clinic . Comprehensive medication review performed. . Discussed plans with patient for ongoing care management follow up and provided patient with direct contact information for care management team . Patient continues to tolerate Ozempic 0.25mg  weekly without adverse events.  He denies adverse events of hypoglycemia.  He is unable to inject Ozempic (patient fearful of self-injecting) and comes into the office to have Ozempic injection every Wednesday.  Patient was able to inject Ozempic with the assistance of the CMA.  He was able to hold injection in stomach region for 3-5 seconds and remove.  He states, "give me some time and I will try to inject".  Plan is needed for patient to self-inject as coming to PCP office weekly may not be feasible in the near future.  Patient verbalizes understanding.  Will continue to provide support regarding self-injection techniques.  Appreciated the assistance of CMA, Tianna. . Patient has been approved for Ozempic patient assistance until 11/21/19 . Last A1c was 7.3 on 06/17/19 (down from 7.4).  [name] states that patient assistance will allow him to maintain blood sugars within goal range.  He reports most FBG in the 90-100s, denies hypoglycemia.  Encouraged patient to adhere to diabetes-healthy diet and  exercise as able and as recommended by PCP.  We discussed meals.  Patient reports adhering to meat, vegetables and small carbohydrate portions.  He is avoiding sugary drinks, etc.  Patient verbalizes understanding. . Diabetic patient on aspirin/statin for prevention as appropriate.  Fill history reviewed.  Rosuvastatin 10mg  #90 DS last filled on 06/11/19 . Will follow up with patient next month  Patient Self Care Activities:  . Currently UNABLE TO independently self inject Ozempic at this time . Attends all scheduled provider appointments . Calls pharmacy for medication refills  Please see past updates related to this goal by clicking on the "Past Updates" button in the selected goal         The patient verbalized understanding of instructions provided today and declined a print copy of patient instruction materials.   The care management team will reach out to the patient again over the next 4-6 weeks.  Regina Eck, PharmD, BCPS Clinical Pharmacist, [facility] [place]: [phone]

## 2019-07-19 NOTE — Progress Notes (Signed)
Chronic Care Management   Visit Note  07/17/2019 Name: Jose Ponce MRN: [id number] DOB: 1941-01-02  Referred by: Jose Chard, MD Reason for referral : Chronic Care Management   Jose Ponce is a 78 y.o. year old male who is a primary care patient of Jose Chard, MD. The CCM team was consulted for assistance with chronic disease management and care coordination needs.   Review of patient status, including review of consultants reports, relevant laboratory and other test results, and collaboration with appropriate care team members and the patient's provider was performed as part of comprehensive patient evaluation and provision of chronic care management services.    I met with Jose Ponce in clinic today.  Medications: Outpatient Encounter Medications as of 07/17/2019  Medication Sig  . allopurinol (ZYLOPRIM) 100 MG tablet TAKE 1/2 TABLET BY MOUTH EVERY DAY  . aspirin 81 MG tablet Take 81 mg by mouth daily.  . cholecalciferol (VITAMIN D) 400 UNITS TABS tablet Take 5,000 Units by mouth.  . Diclofenac Sodium 3 % GEL Place onto the skin.  . Flaxseed, Linseed, (FLAX SEEDS PO) Take 1 tablet by mouth daily.  [name] ketoconazole (NIZORAL) 2 % cream Apply 1 application topically daily.  [name] lidocaine (LIDODERM) 5 % Place 1 patch onto the skin daily. Remove & Discard patch within 12 hours or as directed by MD Place over left lower back.  . Linaclotide (LINZESS) 290 MCG CAPS capsule Take 290 mcg by mouth daily.  . metoprolol succinate (TOPROL-XL) 50 MG 24 hr tablet TAKE 1 TABLET BY MOUTH ONCE DAILY  . rosuvastatin (CRESTOR) 10 MG tablet TAKE 1 TABLET BY MOUTH EVERY DAY  . Semaglutide,0.25 or 0.'5MG'$ /DOS, (OZEMPIC, 0.25 OR 0.5 MG/DOSE,) 2 MG/1.5ML SOPN Inject 0.25 mg into the skin once a week.  . sildenafil (VIAGRA) 50 MG tablet Take 1 tablet (50 mg total) by mouth daily as needed for erectile dysfunction.  [name] telmisartan (MICARDIS) 20 MG tablet TAKE 1 TABLET BY MOUTH EVERY DAY   No  facility-administered encounter medications on file as of 07/17/2019.      Objective:   Goals Addressed            This Visit's Progress     Patient Stated   . I would like to apply for financial assistance for Ozempic & continue to manage my diabetes (pt-stated)       Current Barriers:  . Financial Barriers . Unable to self-inject Ozempic  Pharmacist Clinical Goal(s):  [name] Over the next 90 days, patient will demonstrate Improved medication adherence as evidenced by attempting to self inject Ozempic (improved) . Over the next 30 days, patient will work with CCM PharmD to address needs related to applying for patient assistance (Ozempic) goal complete  Interventions: 07/17/19 Completed face to face visit with patient in clinic . Comprehensive medication review performed. . Discussed plans with patient for ongoing care management follow up and provided patient with direct contact information for care management team . Patient continues to tolerate Ozempic 0.'25mg'$  weekly without adverse events.  He denies adverse events of hypoglycemia.  He is unable to inject Ozempic (patient fearful of self-injecting) and comes into the office to have Ozempic injection every Wednesday.  Patient was able to inject Ozempic with the assistance of the CMA.  He was able to hold injection in stomach region for 3-5 seconds and remove.  He states, "give me some time and I will try to inject".  Plan is needed for patient to self-inject as coming to  PCP office weekly may not be feasible in the near future.  Patient verbalizes understanding.  Will continue to provide support regarding self-injection techniques.  Appreciated the assistance of CMA, Jose Ponce. . Patient has been approved for Ozempic patient assistance until 11/21/19 . Last A1c was 7.3 on 06/17/19 (down from 7.4).  [name] states that patient assistance will allow him to maintain blood sugars within goal range.  He reports most FBG in the 90-100s, denies  hypoglycemia.  Encouraged patient to adhere to diabetes-healthy diet and exercise as able and as recommended by PCP.  We discussed meals.  Patient reports adhering to meat, vegetables and small carbohydrate portions.  He is avoiding sugary drinks, etc.  Patient verbalizes understanding. . Diabetic patient on aspirin/statin for prevention as appropriate.  Fill history reviewed.  Rosuvastatin '10mg'$  #90 DS last filled on 06/11/19.  Patient is ARB (telmisartan) as prescribed. . Will follow up with patient next month  Patient Self Care Activities:  . Currently UNABLE TO independently self inject Ozempic at this time . Attends all scheduled provider appointments . Calls pharmacy for medication refills  Please see past updates related to this goal by clicking on the "Past Updates" button in the selected goal        Plan:   The care management team will reach out to the patient again over the next 4-6 weeks.  Jose Ponce, PharmD, BCPS Clinical Pharmacist, [facility] [place]: [phone]

## 2019-08-05 ENCOUNTER — Telehealth: Payer: Self-pay

## 2019-09-02 ENCOUNTER — Telehealth: Payer: Self-pay

## 2019-09-24 ENCOUNTER — Encounter: Payer: Self-pay | Admitting: Internal Medicine

## 2019-09-24 ENCOUNTER — Ambulatory Visit: Payer: Medicare Other | Admitting: Internal Medicine

## 2019-09-24 ENCOUNTER — Other Ambulatory Visit: Payer: Self-pay

## 2019-09-24 VITALS — BP 120/74 | HR 57 | Temp 97.8°F | Ht 67.4 in | Wt 168.2 lb

## 2019-09-24 DIAGNOSIS — I2510 Atherosclerotic heart disease of native coronary artery without angina pectoris: Secondary | ICD-10-CM

## 2019-09-24 DIAGNOSIS — N183 Chronic kidney disease, stage 3 unspecified: Secondary | ICD-10-CM

## 2019-09-24 DIAGNOSIS — Z6826 Body mass index (BMI) 26.0-26.9, adult: Secondary | ICD-10-CM

## 2019-09-24 DIAGNOSIS — I1310 Hypertensive heart and chronic kidney disease without heart failure, with stage 1 through stage 4 chronic kidney disease, or unspecified chronic kidney disease: Secondary | ICD-10-CM | POA: Diagnosis not present

## 2019-09-24 DIAGNOSIS — E1122 Type 2 diabetes mellitus with diabetic chronic kidney disease: Secondary | ICD-10-CM

## 2019-09-24 DIAGNOSIS — E663 Overweight: Secondary | ICD-10-CM

## 2019-09-24 NOTE — Patient Instructions (Addendum)
Diabetes Mellitus and Exercise Exercising regularly is important for your overall health, especially when you have diabetes (diabetes mellitus). Exercising is not only about losing weight. It has many other health benefits, such as increasing muscle strength and bone density and reducing body fat and stress. This leads to improved fitness, flexibility, and endurance, all of which result in better overall health. Exercise has additional benefits for people with diabetes, including:  Reducing appetite.  Helping to lower and control blood glucose.  Lowering blood pressure.  Helping to control amounts of fatty substances (lipids) in the blood, such as cholesterol and triglycerides.  Helping the body to respond better to insulin (improving insulin sensitivity).  Reducing how much insulin the body needs.  Decreasing the risk for heart disease by: ? Lowering cholesterol and triglyceride levels. ? Increasing the levels of good cholesterol. ? Lowering blood glucose levels. What is my activity plan? Your health care provider or certified diabetes educator can help you make a plan for the type and frequency of exercise (activity plan) that works for you. Make sure that you:  Do at least 150 minutes of moderate-intensity or vigorous-intensity exercise each week. This could be brisk walking, biking, or water aerobics. ? Do stretching and strength exercises, such as yoga or weightlifting, at least 2 times a week. ? Spread out your activity over at least 3 days of the week.  Get some form of physical activity every day. ? Do not go more than 2 days in a row without some kind of physical activity. ? Avoid being inactive for more than 30 minutes at a time. Take frequent breaks to walk or stretch.  Choose a type of exercise or activity that you enjoy, and set realistic goals.  Start slowly, and gradually increase the intensity of your exercise over time. What do I need to know about managing my  diabetes?   Check your blood glucose before and after exercising. ? If your blood glucose is 240 mg/dL (13.3 mmol/L) or higher before you exercise, check your urine for ketones. If you have ketones in your urine, do not exercise until your blood glucose returns to normal. ? If your blood glucose is 100 mg/dL (5.6 mmol/L) or lower, eat a snack containing 15-20 grams of carbohydrate. Check your blood glucose 15 minutes after the snack to make sure that your level is above 100 mg/dL (5.6 mmol/L) before you start your exercise.  Know the symptoms of low blood glucose (hypoglycemia) and how to treat it. Your risk for hypoglycemia increases during and after exercise. Common symptoms of hypoglycemia can include: ? Hunger. ? Anxiety. ? Sweating and feeling clammy. ? Confusion. ? Dizziness or feeling light-headed. ? Increased heart rate or palpitations. ? Blurry vision. ? Tingling or numbness around the mouth, lips, or tongue. ? Tremors or shakes. ? Irritability.  Keep a rapid-acting carbohydrate snack available before, during, and after exercise to help prevent or treat hypoglycemia.  Avoid injecting insulin into areas of the body that are going to be exercised. For example, avoid injecting insulin into: ? The arms, when playing tennis. ? The legs, when jogging.  Keep records of your exercise habits. Doing this can help you and your health care provider adjust your diabetes management plan as needed. Write down: ? Food that you eat before and after you exercise. ? Blood glucose levels before and after you exercise. ? The type and amount of exercise you have done. ? When your insulin is expected to peak, if you use   insulin. Avoid exercising at times when your insulin is peaking.  When you start a new exercise or activity, work with your health care provider to make sure the activity is safe for you, and to adjust your insulin, medicines, or food intake as needed.  Drink plenty of water while  you exercise to prevent dehydration or heat stroke. Drink enough fluid to keep your urine clear or pale yellow. Summary  Exercising regularly is important for your overall health, especially when you have diabetes (diabetes mellitus).  Exercising has many health benefits, such as increasing muscle strength and bone density and reducing body fat and stress.  Your health care provider or certified diabetes educator can help you make a plan for the type and frequency of exercise (activity plan) that works for you.  When you start a new exercise or activity, work with your health care provider to make sure the activity is safe for you, and to adjust your insulin, medicines, or food intake as needed. This information is not intended to replace advice given to you by your health care provider. Make sure you discuss any questions you have with your health care provider. Document Released: 01/28/2004 Document Revised: 06/01/2017 Document Reviewed: 04/18/2016 Elsevier Patient Education  2020 Elsevier Inc[employer]  

## 2019-09-24 NOTE — Progress Notes (Signed)
Subjective:     Patient ID: Jose Ponce , male    DOB: June 15, 1941 , 78 y.o.   MRN: [id number]   Chief Complaint  Patient presents with  . Diabetes  . Hypertension    HPI  Diabetes He presents for his follow-up diabetic visit. He has type 2 diabetes mellitus. His disease course has been stable. There are no hypoglycemic associated symptoms. Pertinent negatives for hypoglycemia include no headaches. Pertinent negatives for diabetes include no blurred vision and no chest pain. There are no hypoglycemic complications. Diabetic complications include nephropathy. Risk factors for coronary artery disease include diabetes mellitus, dyslipidemia, hypertension and male sex. He is following a diabetic diet. He participates in exercise three times a week. An ACE inhibitor/angiotensin II receptor blocker is being taken.  Hypertension This is a chronic problem. The current episode started more than 1 year ago. The problem has been gradually improving since onset. Pertinent negatives include no blurred vision, chest pain, headaches, palpitations or shortness of breath. The current treatment provides moderate improvement. Compliance problems include exercise.  Hypertensive end-organ damage includes kidney disease and CAD/MI.     Past Medical History:  Diagnosis Date  . Chronic kidney disease   . Coronary artery disease   . Gout   . Hyperlipidemia   . Hypertension   . Type 2 diabetes mellitus (HCC)      Family History  Problem Relation Age of Onset  . Asthma Mother   . Heart disease Father   . Stroke Father   . Heart attack Father 38  . Cancer Sister 3  . Diabetes Brother   . Heart disease Maternal Grandmother      Current Outpatient Medications:  .  allopurinol (ZYLOPRIM) 100 MG tablet, TAKE 1/2 TABLET BY MOUTH EVERY DAY, Disp: 45 tablet, Rfl: 1 .  aspirin 81 MG tablet, Take 81 mg by mouth daily., Disp: , Rfl:  .  Diclofenac Sodium 3 % GEL, Place onto the skin., Disp: , Rfl:  .   Flaxseed, Linseed, (FLAX SEEDS PO), Take 1 tablet by mouth daily., Disp: , Rfl:  .  ketoconazole (NIZORAL) 2 % cream, Apply 1 application topically daily., Disp: , Rfl:  .  lidocaine (LIDODERM) 5 %, Place 1 patch onto the skin daily. Remove & Discard patch within 12 hours or as directed by MD Place over left lower back., Disp: 30 patch, Rfl: 0 .  Linaclotide (LINZESS) 290 MCG CAPS capsule, Take 290 mcg by mouth daily., Disp: , Rfl:  .  metoprolol succinate (TOPROL-XL) 50 MG 24 hr tablet, TAKE 1 TABLET BY MOUTH ONCE DAILY, Disp: 30 tablet, Rfl: 5 .  rosuvastatin (CRESTOR) 10 MG tablet, TAKE 1 TABLET BY MOUTH EVERY DAY, Disp: 90 tablet, Rfl: 1 .  Semaglutide,0.25 or 0.'5MG'$ /DOS, (OZEMPIC, 0.25 OR 0.5 MG/DOSE,) 2 MG/1.5ML SOPN, Inject 0.25 mg into the skin once a week., Disp: 3 pen, Rfl: 3 .  telmisartan (MICARDIS) 20 MG tablet, TAKE 1 TABLET BY MOUTH EVERY DAY, Disp: 90 tablet, Rfl: 0 .  cholecalciferol (VITAMIN D) 400 UNITS TABS tablet, Take 5,000 Units by mouth., Disp: , Rfl:  .  sildenafil (VIAGRA) 50 MG tablet, Take 1 tablet (50 mg total) by mouth daily as needed for erectile dysfunction. (Patient not taking: Reported on 09/24/2019), Disp: 30 tablet, Rfl: 0   No Known Allergies   Review of Systems  Constitutional: Negative.   Eyes: Negative for blurred vision.  Respiratory: Negative.  Negative for shortness of breath.   Cardiovascular: Negative.  Negative for chest pain and palpitations.  Gastrointestinal: Negative.   Neurological: Negative.  Negative for headaches.  Psychiatric/Behavioral: Negative.      Today's Vitals   09/24/19 1445  BP: 120/74  Pulse: (!) 57  Temp: 97.8 F (36.6 C)  TempSrc: Oral  Weight: 168 lb 3.2 oz (76.3 kg)  Height: 5' 7.4" (1.712 m)  PainSc: 0-No pain   Body mass index is 26.03 kg/m.   Objective:  Physical Exam Vitals signs and nursing note reviewed.  Constitutional:      Appearance: Normal appearance.  Cardiovascular:     Rate and Rhythm: Normal  rate and regular rhythm.     Heart sounds: Normal heart sounds.  Pulmonary:     Effort: Pulmonary effort is normal.     Breath sounds: Normal breath sounds.  Skin:    General: Skin is warm.  Neurological:     General: No focal deficit present.     Mental Status: He is alert.  Psychiatric:        Mood and Affect: Mood normal.         Assessment And Plan:     1. Diabetes mellitus with stage 3 chronic kidney disease ([place])  Previous labs reviewed in full detail. I will check labs as listed below. He has yet to have eye exam. He called Dr. Gertie Exon office during our visit to schedule an appointment. He is encouraged to keep this upcoming appt.   - Hemoglobin A1c - BMP8+EGFR  2. Benign hypertensive heart and renal disease  Chronic, well controlled. He will continue with current meds. He is encouraged to avoid adding salt to his foods.   3. Coronary artery disease involving native coronary artery of native heart without angina pectoris  Chronic, yet stable. He is s/p CABG.  He is also followed by Cardiology.   4. Overweight with body mass index (BMI) of 26 to 26.9 in adult  He is encouraged to strive to lose 6-10 pounds to further decrease cardiac risk. Importance of regular exercise was discussed with the patient. He is encouraged to strive for 150 minutes of exercise per week.   [name], MD    THE PATIENT IS ENCOURAGED TO PRACTICE SOCIAL DISTANCING DUE TO THE COVID-19 PANDEMIC.

## 2019-09-25 LAB — BMP8+EGFR
BUN/Creatinine Ratio: 12 (ref 10–24)
BUN: 18 mg/dL (ref 8–27)
CO2: 25 mmol/L (ref 20–29)
Calcium: 9.8 mg/dL (ref 8.6–10.2)
Chloride: 103 mmol/L (ref 96–106)
Creatinine, Ser: 1.54 mg/dL — ABNORMAL HIGH (ref 0.76–1.27)
GFR calc Af Amer: 50 mL/min/{1.73_m2} — ABNORMAL LOW (ref >59)
GFR calc non Af Amer: 43 mL/min/{1.73_m2} — ABNORMAL LOW (ref >59)
Glucose: 145 mg/dL — ABNORMAL HIGH (ref 65–99)
Potassium: 4.6 mmol/L (ref 3.5–5.2)
Sodium: 140 mmol/L (ref 134–144)

## 2019-09-25 LAB — HEMOGLOBIN A1C
Est. average glucose Bld gHb Est-mCnc: 171 mg/dL
Hgb A1c MFr Bld: 7.6 % — ABNORMAL HIGH (ref 4.8–5.6)

## 2019-10-07 ENCOUNTER — Ambulatory Visit: Payer: Self-pay | Admitting: Pharmacist

## 2019-10-07 DIAGNOSIS — E1122 Type 2 diabetes mellitus with diabetic chronic kidney disease: Secondary | ICD-10-CM

## 2019-10-07 DIAGNOSIS — I10 Essential (primary) hypertension: Secondary | ICD-10-CM

## 2019-10-07 DIAGNOSIS — N183 Chronic kidney disease, stage 3 unspecified: Secondary | ICD-10-CM

## 2019-10-07 DIAGNOSIS — E785 Hyperlipidemia, unspecified: Secondary | ICD-10-CM

## 2019-10-08 ENCOUNTER — Telehealth: Payer: Self-pay

## 2019-10-08 ENCOUNTER — Ambulatory Visit: Payer: Self-pay

## 2019-10-08 ENCOUNTER — Other Ambulatory Visit: Payer: Self-pay

## 2019-10-08 DIAGNOSIS — I2510 Atherosclerotic heart disease of native coronary artery without angina pectoris: Secondary | ICD-10-CM

## 2019-10-08 DIAGNOSIS — E1122 Type 2 diabetes mellitus with diabetic chronic kidney disease: Secondary | ICD-10-CM

## 2019-10-08 DIAGNOSIS — N183 Chronic kidney disease, stage 3 unspecified: Secondary | ICD-10-CM

## 2019-10-08 DIAGNOSIS — I1310 Hypertensive heart and chronic kidney disease without heart failure, with stage 1 through stage 4 chronic kidney disease, or unspecified chronic kidney disease: Secondary | ICD-10-CM

## 2019-10-09 ENCOUNTER — Ambulatory Visit: Payer: Self-pay | Admitting: Pharmacist

## 2019-10-09 DIAGNOSIS — E1122 Type 2 diabetes mellitus with diabetic chronic kidney disease: Secondary | ICD-10-CM

## 2019-10-09 DIAGNOSIS — N183 Chronic kidney disease, stage 3 unspecified: Secondary | ICD-10-CM

## 2019-10-09 DIAGNOSIS — E785 Hyperlipidemia, unspecified: Secondary | ICD-10-CM

## 2019-10-09 NOTE — Chronic Care Management (AMB) (Signed)
Chronic Care Management   Follow Up Note   10/08/2019 Name: Jose Ponce MRN: [id number] DOB: [birth year]  Referred by: Glendale Chard, MD Reason for referral : Chronic Care Management (CCM RNCM Telephone Follow up )   Jose Ponce is a 78 y.o. year old male who is a primary care patient of Glendale Chard, MD. The CCM team was consulted for assistance with chronic disease management and care coordination needs.    Review of patient status, including review of consultants reports, relevant laboratory and other test results, and collaboration with appropriate care team members and the patient's provider was performed as part of comprehensive patient evaluation and provision of chronic care management services.    SDOH (Social Determinants of Health) screening performed today: None. See Care Plan for related entries.   Placed outbound [facility] telephone call to patient to f/u on his DM.   Outpatient Encounter Medications as of 10/08/2019  Medication Sig  . Semaglutide,0.25 or 0.5MG/DOS, (OZEMPIC, 0.25 OR 0.5 MG/DOSE,) 2 MG/1.5ML SOPN Inject 0.25 mg into the skin once a week.  [name] allopurinol (ZYLOPRIM) 100 MG tablet TAKE 1/2 TABLET BY MOUTH EVERY DAY  . aspirin 81 MG tablet Take 81 mg by mouth daily.  . cholecalciferol (VITAMIN D) 400 UNITS TABS tablet Take 5,000 Units by mouth.  . Diclofenac Sodium 3 % GEL Place onto the skin.  . Flaxseed, Linseed, (FLAX SEEDS PO) Take 1 tablet by mouth daily.  [name] ketoconazole (NIZORAL) 2 % cream Apply 1 application topically daily.  [name] lidocaine (LIDODERM) 5 % Place 1 patch onto the skin daily. Remove & Discard patch within 12 hours or as directed by MD Place over left lower back.  . Linaclotide (LINZESS) 290 MCG CAPS capsule Take 290 mcg by mouth daily.  . metoprolol succinate (TOPROL-XL) 50 MG 24 hr tablet TAKE 1 TABLET BY MOUTH ONCE DAILY  . rosuvastatin (CRESTOR) 10 MG tablet TAKE 1 TABLET BY MOUTH EVERY DAY  . sildenafil (VIAGRA) 50 MG tablet  Take 1 tablet (50 mg total) by mouth daily as needed for erectile dysfunction. (Patient not taking: Reported on 09/24/2019)  . telmisartan (MICARDIS) 20 MG tablet TAKE 1 TABLET BY MOUTH EVERY DAY   No facility-administered encounter medications on file as of 10/08/2019.      Goals Addressed      Patient Stated   . "I would like to learn more about portion control for my diabetes" (pt-stated)       Current Barriers:  [name] Knowledge Deficits related to disease process and Self Health Management of Diabetes . A1C 7.6 (obtained on 09/24/19)  Nurse Case Manager Clinical Goal(s):  [name] Over the next 90 days, patient will work with the CCM team to address needs related to diabetes Self Health management.   Goal Met . Over the next 30 days, patient will have received and reviewed the printed DM patient educational materials; Manage Your Diabetes Zone Tool; The Diabetes Diet; Controlling Your Diabetes Using the Plate Method; s/s Hypo/Hyperglycemia Goal Met . New - 10/09/19 - Over the next 90 days, patient will report improved Self Health Management of his Diabetes as evidence by patient will achieve daily glycemic control 80-130 and patient will lower his A1C <7.6  CCM RN CM  Interventions:  10/08/19 call completed with patient  . Evaluation of current treatment plan related to diabetes and patient's adherence to plan as established by provider. . Provided education to patient re: current A1C of 7.6 obtained on 09/24/19; discussed  target A1C of <7.0; Reviewed and reiterated ADA recommendations to help lower this number including, following a low carb diabetic diet; taking diabetic medications exactly as prescribed, implementing exercise into daily routine; patient admits he was drinking Gingerale and eating more pasta; discussed since learning his A1C is elevated, he has already made the necessary changes in his diet and has started exercising  . Reviewed medications with patient and discussed current use of  Ozempic 0.25 mg weekly injection, patient continues to have injections every Wednesday at PCP office; discussed patient is working with embedded Pharm D Jose Ponce for financial assistance with cost of Ozempic; Sent in bask message to embedded Pharm D Jose Ponce requesting f/u with patient and recommendations for increasing his Ozempic to 0.5 mg weekly; discussed patient has a planned visit at the office tomorrow, 10/09/19   . Discussed plans with patient for ongoing care management follow up and provided patient with direct contact information for care management team . Provided patient with printed educational materials related to Diabetes Management Safety Zone Tool, Carb Counting, Carb Choices, Life's Simple 7  . Advised patient, providing education and rationale, to check cbg before meals and record, calling the CCM team and or PCP provider for findings outside established parameters.  Target FBS (80-130)  Patient Self Care Activities:  . Self administers medications as prescribed . Attends all scheduled provider appointments . Calls pharmacy for medication refills . Performs ADL's independently . Performs IADL's independently . Calls provider office for new concerns or questions  Please see past updates related to this goal by clicking on the "Past Updates" button in the selected goal          Telephone follow up appointment with care management team member scheduled for: 11/05/19  Barb Merino, RN, BSN, CCM Care Management Coordinator [place] [facility]  Direct Phone: [phone]

## 2019-10-09 NOTE — Patient Instructions (Signed)
Visit Information  Goals Addressed      Patient Stated   . "I would like to learn more about portion control for my diabetes" (pt-stated)       Current Barriers:  [name] Knowledge Deficits related to disease process and Self Health Management of Diabetes . A1C 7.6 (obtained on 09/24/19)  Nurse Case Manager Clinical Goal(s):  [name] Over the next 90 days, patient will work with the CCM team to address needs related to diabetes Self Health management.   Goal Met . Over the next 30 days, patient will have received and reviewed the printed DM patient educational materials; Manage Your Diabetes Zone Tool; The Diabetes Diet; Controlling Your Diabetes Using the Plate Method; s/s Hypo/Hyperglycemia Goal Met . New - 10/09/19 - Over the next 90 days, patient will report improved Self Health Management of his Diabetes as evidence by patient will achieve daily glycemic control 80-130 and patient will lower his A1C <7.6  CCM RN CM  Interventions:  10/08/19 call completed with patient  . Evaluation of current treatment plan related to diabetes and patient's adherence to plan as established by provider. . Provided education to patient re: current A1C of 7.6 obtained on 09/24/19; discussed target A1C of <7.0; Reviewed and reiterated ADA recommendations to help lower this number including, following a low carb diabetic diet; taking diabetic medications exactly as prescribed, implementing exercise into daily routine; patient admits he was drinking Gingerale and eating more pasta; discussed since learning his A1C is elevated, he has already made the necessary changes in his diet and has started exercising  . Reviewed medications with patient and discussed current use of Ozempic 0.25 mg weekly injection, patient continues to have injections every Wednesday at PCP office; discussed patient is working with embedded Pharm D Lottie Dawson for financial assistance with cost of Ozempic; Sent in bask message to embedded Pharm D Lottie Dawson requesting f/u with patient and recommendations for increasing his Ozempic to 0.5 mg weekly; discussed patient has a planned visit at the office tomorrow, 10/09/19   . Discussed plans with patient for ongoing care management follow up and provided patient with direct contact information for care management team . Provided patient with printed educational materials related to Diabetes Management Safety Zone Tool, Carb Counting, Carb Choices, Life's Simple 7  . Advised patient, providing education and rationale, to check cbg before meals and record, calling the CCM team and or PCP provider for findings outside established parameters.  Target FBS (80-130)  Patient Self Care Activities:  . Self administers medications as prescribed . Attends all scheduled provider appointments . Calls pharmacy for medication refills . Performs ADL's independently . Performs IADL's independently . Calls provider office for new concerns or questions  Please see past updates related to this goal by clicking on the "Past Updates" button in the selected goal        The patient verbalized understanding of instructions provided today and declined a print copy of patient instruction materials.   Telephone follow up appointment with care management team member scheduled for: 11/05/19  Barb Merino, RN, BSN, CCM Care Management Coordinator [place] [facility]  Direct Phone: [phone]

## 2019-10-11 NOTE — Progress Notes (Signed)
Chronic Care Management   Visit Note  10/09/2019 Name: Jose Ponce MRN: [id number] DOB: Jun 14, 1941  Referred by: Glendale Chard, MD Reason for referral : Chronic Care Management   Jose Ponce is a 78 y.o. year old male who is a primary care patient of Glendale Chard, MD. The CCM team was consulted for assistance with chronic disease management and care coordination needs related to DMII  Review of patient status, including review of consultants reports, relevant laboratory and other test results, and collaboration with appropriate care team members and the patient's provider was performed as part of comprehensive patient evaluation and provision of chronic care management services.    I met with [name] in clinic today.  Medications: Outpatient Encounter Medications as of 10/09/2019  Medication Sig  . allopurinol (ZYLOPRIM) 100 MG tablet TAKE 1/2 TABLET BY MOUTH EVERY DAY  . aspirin 81 MG tablet Take 81 mg by mouth daily.  . cholecalciferol (VITAMIN D) 400 UNITS TABS tablet Take 5,000 Units by mouth.  . Diclofenac Sodium 3 % GEL Place onto the skin.  . Flaxseed, Linseed, (FLAX SEEDS PO) Take 1 tablet by mouth daily.  [name] ketoconazole (NIZORAL) 2 % cream Apply 1 application topically daily.  [name] lidocaine (LIDODERM) 5 % Place 1 patch onto the skin daily. Remove & Discard patch within 12 hours or as directed by MD Place over left lower back.  . Linaclotide (LINZESS) 290 MCG CAPS capsule Take 290 mcg by mouth daily.  . metoprolol succinate (TOPROL-XL) 50 MG 24 hr tablet TAKE 1 TABLET BY MOUTH ONCE DAILY  . rosuvastatin (CRESTOR) 10 MG tablet TAKE 1 TABLET BY MOUTH EVERY DAY  . Semaglutide,0.25 or 0.5MG/DOS, (OZEMPIC, 0.25 OR 0.5 MG/DOSE,) 2 MG/1.5ML SOPN Inject 0.25 mg into the skin once a week.  . sildenafil (VIAGRA) 50 MG tablet Take 1 tablet (50 mg total) by mouth daily as needed for erectile dysfunction. (Patient not taking: Reported on 09/24/2019)  . telmisartan (MICARDIS) 20 MG  tablet TAKE 1 TABLET BY MOUTH EVERY DAY   No facility-administered encounter medications on file as of 10/09/2019.      Objective:   Goals Addressed            This Visit's Progress     Patient Stated   . I would like to apply for financial assistance for Ozempic & continue to manage my diabetes (pt-stated)       Current Barriers:  . Financial Barriers . Unable to self-inject Ozempic  Pharmacist Clinical Goal(s):  [name] Over the next 90 days, patient will demonstrate Improved medication adherence as evidenced by attempting to self inject Ozempic (improving) . Over the next 30 days, patient will work with CCM PharmD to address needs related to applying for patient assistance (Ozempic) goal complete  Interventions: Face to face clinic visit on 10/09/19 . Comprehensive medication review performed. . Discussed plans with patient for ongoing care management follow up and provided patient with direct contact information for care management team . Patient continues to tolerate Ozempic 0.44m weekly without adverse events.  He denies adverse events of hypoglycemia.  He is unable to inject Ozempic (patient fearful of self-injecting) and comes into the office to have Ozempic injection every Wednesday.  Patient was able to inject Ozempic with the assistance of the CMA.  He was able to hold injection in stomach region for 3-5 seconds and remove.  He states, "give me some time and I will try to inject".  Plan is needed for patient  to self-inject as coming to PCP office weekly may not be feasible in the near future.  Patient verbalizes understanding.  Will continue to provide support regarding self-injection techniques.  Appreciated the assistance of CMA, [place] . Patient has been approved for Ozempic patient assistance until 11/21/19. Refills requested from [facility] last month (last refill for 2020).  Will reapply with patient in January 2021. [name] Last A1c was 7.6% on 09/24/19 (7.3% in July 2020)).   [name] states that patient assistance will allow him to maintain blood sugars within goal range.  He reports most FBG in the 150-200s, denies hypoglycemia. We discussed meals.  Patient reports his diet has included more carbohydrates lately (mac & cheese, potatoes, etc).  He is avoiding sugary drinks.  Encouraged patient to adhere to diabetes-healthy diet and exercise as able and as recommended by PCP.  Patient verbalizes understanding. o Recommend to increase OZEMPIC to 0.44m sq weekly.  Patient verbalizes understanding.  We will start this at next clinic visit on 10/16/19 . Diabetic patient on aspirin/statin for prevention as appropriate.  Fill history reviewed.  Rosuvastatin 1102m#90 DS last filled on 06/11/19.  Patient is ARB (telmisartan) as prescribed. . Will follow up with patient next month  Patient Self Care Activities:  . Currently UNABLE TO independently self inject Ozempic at this time . Attends all scheduled provider appointments . Calls pharmacy for medication refills  Please see past updates related to this goal by clicking on the "Past Updates" button in the selected goal         Plan:   The care management team will reach out to the patient again over the next 7 days.   Provider Signature JuRegina EckPharmD, BCPS Clinical Pharmacist, [facility] [phone]

## 2019-10-11 NOTE — Patient Instructions (Signed)
Visit Information  Goals Addressed            This Visit's Progress     Patient Stated   . I would like to apply for financial assistance for Ozempic & continue to manage my diabetes (pt-stated)       Current Barriers:  . Financial Barriers . Unable to self-inject Ozempic  Pharmacist Clinical Goal(s):  [name] Over the next 90 days, patient will demonstrate Improved medication adherence as evidenced by attempting to self inject Ozempic (improving) . Over the next 30 days, patient will work with CCM PharmD to address needs related to applying for patient assistance (Ozempic) goal complete  Interventions: Face to face clinic visit on 10/09/19 . Comprehensive medication review performed. . Discussed plans with patient for ongoing care management follow up and provided patient with direct contact information for care management team . Patient continues to tolerate Ozempic 0.75m weekly without adverse events.  He denies adverse events of hypoglycemia.  He is unable to inject Ozempic (patient fearful of self-injecting) and comes into the office to have Ozempic injection every Wednesday.  Patient was able to inject Ozempic with the assistance of the CMA.  He was able to hold injection in stomach region for 3-5 seconds and remove.  He states, "give me some time and I will try to inject".  Plan is needed for patient to self-inject as coming to PCP office weekly may not be feasible in the near future.  Patient verbalizes understanding.  Will continue to provide support regarding self-injection techniques.  Appreciated the assistance of [place], [place] . Patient has been approved for Ozempic patient assistance until 11/21/19. Refills requested from [facility] last month (last refill for 2020).  Will reapply with patient in January 2021. .Marland KitchenLast A1c was 7.6% on 09/24/19 (7.3% in July 2020)).  [name] states that patient assistance will allow him to maintain blood sugars within goal range.  He reports most FBG in the  150-200s, denies hypoglycemia. We discussed meals.  Patient reports his diet has included more carbohydrates lately (mac & cheese, potatoes, etc).  He is avoiding sugary drinks.  Encouraged patient to adhere to diabetes-healthy diet and exercise as able and as recommended by PCP.  Patient verbalizes understanding. o Recommend to increase OZEMPIC to 0.554msq weekly.  Patient verbalizes understanding.  We will start this at next clinic visit on 10/16/19 . Diabetic patient on aspirin/statin for prevention as appropriate.  Fill history reviewed.  Rosuvastatin 1045m90 DS last filled on 06/11/19.  Patient is ARB (telmisartan) as prescribed. . Will follow up with patient next month  Patient Self Care Activities:  . Currently UNABLE TO independently self inject Ozempic at this time . Attends all scheduled provider appointments . Calls pharmacy for medication refills  Please see past updates related to this goal by clicking on the "Past Updates" button in the selected goal         The patient verbalized understanding of instructions provided today and declined a print copy of patient instruction materials.   The care management team will reach out to the patient again over the next 7 days.   SIGNATURE JulRegina EckharmD, BCPS Clinical Pharmacist, [facility] [phone]

## 2019-10-11 NOTE — Progress Notes (Addendum)
Chronic Care Management   Visit Note  10/07/2019 Name: Jose Ponce MRN: [id number] DOB: 14-Dec-1940  Referred by: Glendale Chard, MD Reason for referral : CCM-diabetes   Jose Ponce is a 78 y.o. year old male who is a primary care patient of Glendale Chard, MD. The CCM team was consulted for assistance with chronic disease management and care coordination needs related to DMII  Review of patient status, including review of consultants reports, relevant laboratory and other test results, and collaboration with appropriate care team members and the patient's provider was performed as part of comprehensive patient evaluation and provision of chronic care management services.    I met with Ms. Campau in clinic today.  Medications: Outpatient Encounter Medications as of 10/07/2019  Medication Sig  . allopurinol (ZYLOPRIM) 100 MG tablet TAKE 1/2 TABLET BY MOUTH EVERY DAY  . aspirin 81 MG tablet Take 81 mg by mouth daily.  . cholecalciferol (VITAMIN D) 400 UNITS TABS tablet Take 5,000 Units by mouth.  . Diclofenac Sodium 3 % GEL Place onto the skin.  . Flaxseed, Linseed, (FLAX SEEDS PO) Take 1 tablet by mouth daily.  [name] ketoconazole (NIZORAL) 2 % cream Apply 1 application topically daily.  [name] lidocaine (LIDODERM) 5 % Place 1 patch onto the skin daily. Remove & Discard patch within 12 hours or as directed by MD Place over left lower back.  . Linaclotide (LINZESS) 290 MCG CAPS capsule Take 290 mcg by mouth daily.  . metoprolol succinate (TOPROL-XL) 50 MG 24 hr tablet TAKE 1 TABLET BY MOUTH ONCE DAILY  . rosuvastatin (CRESTOR) 10 MG tablet TAKE 1 TABLET BY MOUTH EVERY DAY  . Semaglutide,0.25 or 0.5MG/DOS, (OZEMPIC, 0.25 OR 0.5 MG/DOSE,) 2 MG/1.5ML SOPN Inject 0.25 mg into the skin once a week.  . sildenafil (VIAGRA) 50 MG tablet Take 1 tablet (50 mg total) by mouth daily as needed for erectile dysfunction. (Patient not taking: Reported on 09/24/2019)  . telmisartan (MICARDIS) 20 MG tablet TAKE  1 TABLET BY MOUTH EVERY DAY   No facility-administered encounter medications on file as of 10/07/2019.      Objective:   Goals Addressed            This Visit's Progress     Patient Stated   . I would like to apply for financial assistance for Ozempic & continue to manage my diabetes (pt-stated)       Current Barriers:  . Financial Barriers . Unable to self-inject Ozempic  Pharmacist Clinical Goal(s):  [name] Over the next 90 days, patient will demonstrate Improved medication adherence as evidenced by attempting to self inject Ozempic (improving) . Over the next 30 days, patient will work with CCM PharmD to address needs related to applying for patient assistance (Ozempic) goal complete  Interventions: . Comprehensive medication review performed. . Discussed plans with patient for ongoing care management follow up and provided patient with direct contact information for care management team . Patient continues to tolerate Ozempic 0.22m weekly without adverse events.  He denies adverse events of hypoglycemia.  He is unable to inject Ozempic (patient fearful of self-injecting) and comes into the office to have Ozempic injection every Wednesday.  Patient was able to inject Ozempic with the assistance of the CMA.  He was able to hold injection in stomach region for 3-5 seconds and remove.  He states, "give me some time and I will try to inject".  Plan is needed for patient to self-inject as coming to PCP office weekly may  not be feasible in the near future.  Patient verbalizes understanding.  Will continue to provide support regarding self-injection techniques.  Appreciated the assistance of CMA, [place] . Patient has been approved for Ozempic patient assistance until 11/21/19. Refills requested from [facility] last month (last refill for 2020).  Will reapply with patient in January 2021. [name] Last A1c was 7.6% on 09/24/19 (7.3% in July 2020)).  [name] states that patient assistance will allow him to  maintain blood sugars within goal range.  He reports most FBG in the 150-200s, denies hypoglycemia. We discussed meals.  Patient reports his diet has included more carbohydrates lately (mac & cheese, potatoes, etc).  He is avoiding sugary drinks.  Encouraged patient to adhere to diabetes-healthy diet and exercise as able and as recommended by PCP.  Patient verbalizes understanding. . Diabetic patient on aspirin/statin for prevention as appropriate.  Fill history reviewed.  Rosuvastatin 66m #90 DS last filled on 06/11/19.  Patient is ARB (telmisartan) as prescribed. . Will follow up with patient next month  Patient Self Care Activities:  . Currently UNABLE TO independently self inject Ozempic at this time . Attends all scheduled provider appointments . Calls pharmacy for medication refills  Please see past updates related to this goal by clicking on the "Past Updates" button in the selected goal          Plan:   The care management team will reach out to the patient again over the next 30 days.   Provider Signature JRegina Eck PharmD, BCPS Clinical Pharmacist, [facility] [place] [phone]

## 2019-10-11 NOTE — Patient Instructions (Signed)
Visit Information  Goals Addressed            This Visit's Progress     Patient Stated   . I would like to apply for financial assistance for Ozempic & continue to manage my diabetes (pt-stated)       Current Barriers:  . Financial Barriers . Unable to self-inject Ozempic  Pharmacist Clinical Goal(s):  [name] Over the next 90 days, patient will demonstrate Improved medication adherence as evidenced by attempting to self inject Ozempic (improved) . Over the next 30 days, patient will work with CCM PharmD to address needs related to applying for patient assistance (Ozempic) goal complete  Interventions: 10/07/19 Completed face to face visit with patient in clinic . Comprehensive medication review performed. . Discussed plans with patient for ongoing care management follow up and provided patient with direct contact information for care management team . Patient continues to tolerate Ozempic 0.88m weekly without adverse events.  He denies adverse events of hypoglycemia.  He is unable to inject Ozempic (patient fearful of self-injecting) and comes into the office to have Ozempic injection every Wednesday.  Patient was able to inject Ozempic with the assistance of the CMA.  He was able to hold injection in stomach region for 3-5 seconds and remove.  He states, "give me some time and I will try to inject".  Plan is needed for patient to self-inject as coming to PCP office weekly may not be feasible in the near future.  Patient verbalizes understanding.  Will continue to provide support regarding self-injection techniques.  [place] . Patient has been approved for Ozempic patient assistance until 11/21/19. Refills requested from [facility] last month (last refill for 2020).  Will reapply with patient in January 2021. .Marland KitchenLast A1c was 7.6% on 09/24/19 (7.3% in July 2020)).  [name] states that patient assistance will allow him to maintain blood sugars within goal range.  He  reports most FBG in the 150-200s, denies hypoglycemia. We discussed meals.  Patient reports his diet has included more carbohydrates lately (mac & cheese, potatoes, etc).  He is avoiding sugary drinks.  Encouraged patient to adhere to diabetes-healthy diet and exercise as able and as recommended by PCP.  Patient verbalizes understanding. o Recommend to increase Ozempic to 0.574msq weekly.  Will initiate Ozempic 0.66m4mnjection at visit next WED (10/16/19) . Diabetic patient on aspirin/statin for prevention as appropriate.  Fill history reviewed.  Rosuvastatin 51m22m0 DS last filled on 06/11/19.  Patient is ARB (telmisartan) as prescribed. . Will follow up with patient next month  Patient Self Care Activities:  . Currently UNABLE TO independently self inject Ozempic at this time . Attends all scheduled provider appointments . Calls pharmacy for medication refills  Please see past updates related to this goal by clicking on the "Past Updates" button in the selected goal         The patient verbalized understanding of instructions provided today and declined a print copy of patient instruction materials.   The care management team will reach out to the patient again over the next 30 days.   SIGNATURE JuliRegina EckarmD, BCPS Clinical Pharmacist, [facility] [phone]

## 2019-10-16 ENCOUNTER — Ambulatory Visit (INDEPENDENT_AMBULATORY_CARE_PROVIDER_SITE_OTHER): Payer: Medicare Other

## 2019-10-16 DIAGNOSIS — N183 Chronic kidney disease, stage 3 unspecified: Secondary | ICD-10-CM | POA: Diagnosis not present

## 2019-10-16 DIAGNOSIS — E1122 Type 2 diabetes mellitus with diabetic chronic kidney disease: Secondary | ICD-10-CM | POA: Diagnosis not present

## 2019-10-16 DIAGNOSIS — E785 Hyperlipidemia, unspecified: Secondary | ICD-10-CM | POA: Diagnosis not present

## 2019-10-16 NOTE — Patient Instructions (Signed)
Visit Information  Goals Addressed            This Visit's Progress     Patient Stated   . I would like to apply for financial assistance for Ozempic & continue to manage my diabetes (pt-stated)       Current Barriers:  . Financial Barriers . Unable to self-inject Ozempic  Pharmacist Clinical Goal(s):  [name] Over the next 90 days, patient will demonstrate Improved medication adherence as evidenced by attempting to self inject Ozempic (improving) . Over the next 30 days, patient will work with CCM PharmD to address needs related to applying for patient assistance (Ozempic) goal complete  Interventions: Face to face clinic visit on 10/16/19 . Comprehensive medication review performed. . Discussed plans with patient for ongoing care management follow up and provided patient with direct contact information for care management team . Patient continues to tolerate Ozempic 0.25mg  weekly without adverse events.  He denies adverse events of hypoglycemia.  He is unable to inject Ozempic (patient fearful of self-injecting) and comes into the office to have Ozempic injection every Wednesday.  Patient was able to inject Ozempic with the assistance of the CMA.  He was able to hold injection in stomach region for 3-5 seconds and remove.  He states, "give me some time and I will try to inject".  Plan is needed for patient to self-inject as coming to PCP office weekly may not be feasible in the near future.  Patient verbalizes understanding.  Will continue to provide support regarding self-injection techniques.  Appreciated the assistance of CMA, [place] . Patient has been approved for Ozempic patient assistance until 11/21/19. Refills requested from [facility] last month (last refill for 2020).  Will reapply with patient in January 2021. [name] Last A1c was 7.6% on 09/24/19 (7.3% in July 2020).  [name] states that patient assistance will allow him to maintain blood sugars within goal range.  He reports most FBG in the  150-180, denies hypoglycemia. We discussed meals.  Patient reports he is doing better with his diet and has cut carbs since we spoke last week.  He is avoiding sugary drinks.  Encouraged patient to adhere to diabetes-healthy diet and exercise as able and as recommended by PCP.  Patient verbalizes understanding. o Will continue current dose of Ozempic 0.25mg  weekly.  If patient reverts back to carb heavy diet and Bgs increase, recommend to increase OZEMPIC to 0.5mg  sq weekly.  Patient verbalizes understanding.   . Diabetic patient on aspirin/statin for prevention as appropriate.  Fill history reviewed.  Rosuvastatin 10mg  #90 DS last filled on 09/09/19.  Patient is ARB (telmisartan) as prescribed. . Will follow up with patient next week  Patient Self Care Activities:  . Currently UNABLE TO independently self inject Ozempic at this time . Attends all scheduled provider appointments . Calls pharmacy for medication refills  Please see past updates related to this goal by clicking on the "Past Updates" button in the selected goal         The patient verbalized understanding of instructions provided today and declined a print copy of patient instruction materials.   The care management team will reach out to the patient again over the next 30 days.   SIGNATURE Regina Eck, PharmD, BCPS Clinical Pharmacist, [facility] [place]: [phone]

## 2019-10-16 NOTE — Progress Notes (Signed)
Chronic Care Management    Visit Note  10/16/2019 Name: Jose Ponce MRN: [id number] DOB: 14-Jan-1941  Referred by: Glendale Chard, MD Reason for referral : Chronic Care Management   Jose Ponce is a 78 y.o. year old male who is a primary care patient of Glendale Chard, MD. The CCM team was consulted for assistance with chronic disease management and care coordination needs related to DMII  Review of patient status, including review of consultants reports, relevant laboratory and other test results, and collaboration with appropriate care team members and the patient's provider was performed as part of comprehensive patient evaluation and provision of chronic care management services.    I spoke with Jose Ponce in clinic today  Medications: Outpatient Encounter Medications as of 10/16/2019  Medication Sig  . allopurinol (ZYLOPRIM) 100 MG tablet TAKE 1/2 TABLET BY MOUTH EVERY DAY  . aspirin 81 MG tablet Take 81 mg by mouth daily.  . cholecalciferol (VITAMIN D) 400 UNITS TABS tablet Take 5,000 Units by mouth.  . Diclofenac Sodium 3 % GEL Place onto the skin.  . Flaxseed, Linseed, (FLAX SEEDS PO) Take 1 tablet by mouth daily.  [name] ketoconazole (NIZORAL) 2 % cream Apply 1 application topically daily.  [name] lidocaine (LIDODERM) 5 % Place 1 patch onto the skin daily. Remove & Discard patch within 12 hours or as directed by MD Place over left lower back.  . Linaclotide (LINZESS) 290 MCG CAPS capsule Take 290 mcg by mouth daily.  . metoprolol succinate (TOPROL-XL) 50 MG 24 hr tablet TAKE 1 TABLET BY MOUTH ONCE DAILY  . rosuvastatin (CRESTOR) 10 MG tablet TAKE 1 TABLET BY MOUTH EVERY DAY  . Semaglutide,0.25 or 0.5MG /DOS, (OZEMPIC, 0.25 OR 0.5 MG/DOSE,) 2 MG/1.5ML SOPN Inject 0.25 mg into the skin once a week.  . sildenafil (VIAGRA) 50 MG tablet Take 1 tablet (50 mg total) by mouth daily as needed for erectile dysfunction. (Patient not taking: Reported on 09/24/2019)  . telmisartan (MICARDIS) 20  MG tablet TAKE 1 TABLET BY MOUTH EVERY DAY   No facility-administered encounter medications on file as of 10/16/2019.      Objective:   Goals Addressed            This Visit's Progress     Patient Stated   . I would like to apply for financial assistance for Ozempic & continue to manage my diabetes (pt-stated)       Current Barriers:  . Financial Barriers . Unable to self-inject Ozempic  Pharmacist Clinical Goal(s):  [name] Over the next 90 days, patient will demonstrate Improved medication adherence as evidenced by attempting to self inject Ozempic (improving) . Over the next 30 days, patient will work with CCM PharmD to address needs related to applying for patient assistance (Ozempic) goal complete  Interventions: Face to face clinic visit on 10/16/19 . Comprehensive medication review performed. . Discussed plans with patient for ongoing care management follow up and provided patient with direct contact information for care management team . Patient continues to tolerate Ozempic 0.25mg  weekly without adverse events.  He denies adverse events of hypoglycemia.  He is unable to inject Ozempic (patient fearful of self-injecting) and comes into the office to have Ozempic injection every Wednesday.  Patient was able to inject Ozempic with the assistance of the CMA.  He was able to hold injection in stomach region for 3-5 seconds and remove.  He states, "give me some time and I will try to inject".  Plan is needed for  patient to self-inject as coming to PCP office weekly may not be feasible in the near future.  Patient verbalizes understanding.  Will continue to provide support regarding self-injection techniques.  Appreciated the assistance of CMA, [place] . Patient has been approved for Ozempic patient assistance until 11/21/19. Refills requested from [facility] last month (last refill for 2020).  Will reapply with patient in January 2021. [name] Last A1c was 7.6% on 09/24/19 (7.3% in July 2020).   [name] states that patient assistance will allow him to maintain blood sugars within goal range.  He reports most FBG in the 150-180, denies hypoglycemia. We discussed meals.  Patient reports he is doing better with his diet and has cut carbs since we spoke last week.  He is avoiding sugary drinks.  Encouraged patient to adhere to diabetes-healthy diet and exercise as able and as recommended by PCP.  Patient verbalizes understanding. o Will continue current dose of Ozempic 0.25mg  weekly.  If patient reverts back to carb heavy diet and Bgs increase, recommend to increase OZEMPIC to 0.5mg  sq weekly.  Patient verbalizes understanding.   . Diabetic patient on aspirin/statin for prevention as appropriate.  Fill history reviewed.  Rosuvastatin 10mg  #90 DS last filled on 09/09/19.  Patient is ARB (telmisartan) as prescribed. . Will follow up with patient next week  Patient Self Care Activities:  . Currently UNABLE TO independently self inject Ozempic at this time . Attends all scheduled provider appointments . Calls pharmacy for medication refills  Please see past updates related to this goal by clicking on the "Past Updates" button in the selected goal          Plan:   The care management team will reach out to the patient again over the next 7 days.   Provider Signature Regina Eck, PharmD, BCPS Clinical Pharmacist, [facility] [place]: [phone]

## 2019-10-23 ENCOUNTER — Ambulatory Visit (INDEPENDENT_AMBULATORY_CARE_PROVIDER_SITE_OTHER): Payer: Medicare Other | Admitting: Pharmacist

## 2019-10-23 DIAGNOSIS — E1122 Type 2 diabetes mellitus with diabetic chronic kidney disease: Secondary | ICD-10-CM

## 2019-10-23 DIAGNOSIS — N183 Chronic kidney disease, stage 3 unspecified: Secondary | ICD-10-CM

## 2019-10-25 ENCOUNTER — Ambulatory Visit: Payer: Medicare Other

## 2019-10-28 NOTE — Progress Notes (Signed)
Chronic Care Management    Visit Note  10/23/2019 Name: Jose Ponce MRN: [id number] DOB: [birth year]  Referred by: Glendale Chard, MD Reason for referral : Chronic Care Management   Jose Ponce is a 78 y.o. year old male who is a primary care patient of Glendale Chard, MD. The CCM team was consulted for assistance with chronic disease management and care coordination needs related to DMII  Review of patient status, including review of consultants reports, relevant laboratory and other test results, and collaboration with appropriate care team members and the patient's provider was performed as part of comprehensive patient evaluation and provision of chronic care management services.    I spoke with [name] in clinic today.  Medications: Outpatient Encounter Medications as of 10/23/2019  Medication Sig  . allopurinol (ZYLOPRIM) 100 MG tablet TAKE 1/2 TABLET BY MOUTH EVERY DAY  . aspirin 81 MG tablet Take 81 mg by mouth daily.  . cholecalciferol (VITAMIN D) 400 UNITS TABS tablet Take 5,000 Units by mouth.  . Diclofenac Sodium 3 % GEL Place onto the skin.  . Flaxseed, Linseed, (FLAX SEEDS PO) Take 1 tablet by mouth daily.  [name] ketoconazole (NIZORAL) 2 % cream Apply 1 application topically daily.  [name] lidocaine (LIDODERM) 5 % Place 1 patch onto the skin daily. Remove & Discard patch within 12 hours or as directed by MD Place over left lower back.  . Linaclotide (LINZESS) 290 MCG CAPS capsule Take 290 mcg by mouth daily.  . metoprolol succinate (TOPROL-XL) 50 MG 24 hr tablet TAKE 1 TABLET BY MOUTH ONCE DAILY  . rosuvastatin (CRESTOR) 10 MG tablet TAKE 1 TABLET BY MOUTH EVERY DAY  . Semaglutide,0.25 or 0.5MG /DOS, (OZEMPIC, 0.25 OR 0.5 MG/DOSE,) 2 MG/1.5ML SOPN Inject 0.25 mg into the skin once a week.  . sildenafil (VIAGRA) 50 MG tablet Take 1 tablet (50 mg total) by mouth daily as needed for erectile dysfunction. (Patient not taking: Reported on 09/24/2019)  . telmisartan (MICARDIS) 20 MG  tablet TAKE 1 TABLET BY MOUTH EVERY DAY   No facility-administered encounter medications on file as of 10/23/2019.      Objective:   Goals Addressed            This Visit's Progress     Patient Stated   . I would like to apply for financial assistance for Ozempic & continue to manage my diabetes (pt-stated)       Current Barriers:  . Financial Barriers . Unable to self-inject Ozempic  Pharmacist Clinical Goal(s):  [name] Over the next 90 days, patient will demonstrate Improved medication adherence as evidenced by attempting to self inject Ozempic goal re-established on 10/23/19 for 2021 goals of care . Over the next 60 days, patient will work with CCM PharmD to address needs related to applying for patient assistance (Ozempic) goal re-established on 10/23/19 for 2021 goals of care  Interventions: Face to face clinic visit on 10/23/19 . Comprehensive medication review performed. . Discussed plans with patient for ongoing care management follow up and provided patient with direct contact information for care management team . Patient continues to tolerate Ozempic 0.25mg  weekly without adverse events.  He denies adverse events of hypoglycemia.  He is unable to inject Ozempic (patient fearful of self-injecting) and comes into the office to have Ozempic injection every Wednesday.  Patient was able to inject Ozempic with the assistance of the CMA.  He was able to hold injection in stomach region for 3-5 seconds and remove.  He states, "  give me some time and I will try to inject".  Plan is needed for patient to self-inject as coming to PCP office weekly may not be feasible in the near future.  Patient verbalizes understanding.  Will continue to provide support regarding self-injection techniques.  Appreciated the assistance of CMA, [place] . Patient has been approved for Ozempic patient assistance until 11/21/19. Refills requested from [facility] last month (last refill for 2020).  Will reapply with patient  in January 2021.  Patient filled out paperwork, however will need to bring in financials . Last A1c was 7.6% on 09/24/19 (7.3% in July 2020).  [name] states that patient assistance will allow him to maintain blood sugars within goal range.  He reports most FBG in the 150-180, denies hypoglycemia. We discussed meals.  Patient reports he is doing better with his diet and has cut carbs since we spoke last week.  He is avoiding sugary drinks.  Encouraged patient to adhere to diabetes-healthy diet and exercise as able and as recommended by PCP.  Patient verbalizes understanding. o Will continue current dose of Ozempic 0.25mg  weekly.  If patient reverts back to carb heavy diet and Bgs increase, recommend to increase OZEMPIC to 0.5mg  sq weekly.  Patient verbalizes understanding.   . Diabetic patient on aspirin/statin for prevention as appropriate.  Fill history reviewed.  Rosuvastatin 10mg  #90 DS last filled on 09/09/19.  Patient is ARB (telmisartan) as prescribed. . Will follow up with patient next week  Patient Self Care Activities:  . Currently UNABLE TO independently self inject Ozempic at this time . Attends all scheduled provider appointments . Calls pharmacy for medication refills  Please see past updates related to this goal by clicking on the "Past Updates" button in the selected goal          Plan:   The care management team will reach out to the patient again over the next 30 days.   Provider Signature Regina Eck, PharmD, BCPS Clinical Pharmacist, [facility] [place]: [phone]

## 2019-10-28 NOTE — Patient Instructions (Signed)
Visit Information  Goals Addressed            This Visit's Progress     Patient Stated   . I would like to apply for financial assistance for Ozempic & continue to manage my diabetes (pt-stated)       Current Barriers:  . Financial Barriers . Unable to self-inject Ozempic  Pharmacist Clinical Goal(s):  [name] Over the next 90 days, patient will demonstrate Improved medication adherence as evidenced by attempting to self inject Ozempic goal re-established on 10/23/19 for 2021 goals of care . Over the next 60 days, patient will work with CCM PharmD to address needs related to applying for patient assistance (Ozempic) goal re-established on 10/23/19 for 2021 goals of care  Interventions: Face to face clinic visit on 10/23/19 . Comprehensive medication review performed. . Discussed plans with patient for ongoing care management follow up and provided patient with direct contact information for care management team . Patient continues to tolerate Ozempic 0.25mg  weekly without adverse events.  He denies adverse events of hypoglycemia.  He is unable to inject Ozempic (patient fearful of self-injecting) and comes into the office to have Ozempic injection every Wednesday.  Patient was able to inject Ozempic with the assistance of the CMA.  He was able to hold injection in stomach region for 3-5 seconds and remove.  He states, "give me some time and I will try to inject".  Plan is needed for patient to self-inject as coming to PCP office weekly may not be feasible in the near future.  Patient verbalizes understanding.  Will continue to provide support regarding self-injection techniques.  Appreciated the assistance of CMA, [place] . Patient has been approved for Ozempic patient assistance until 11/21/19. Refills requested from [facility] last month (last refill for 2020).  Will reapply with patient in January 2021.  Patient filled out paperwork, however will need to bring in financials . Last A1c was 7.6% on  09/24/19 (7.3% in July 2020).  [name] states that patient assistance will allow him to maintain blood sugars within goal range.  He reports most FBG in the 150-180, denies hypoglycemia. We discussed meals.  Patient reports he is doing better with his diet and has cut carbs since we spoke last week.  He is avoiding sugary drinks.  Encouraged patient to adhere to diabetes-healthy diet and exercise as able and as recommended by PCP.  Patient verbalizes understanding. o Will continue current dose of Ozempic 0.25mg  weekly.  If patient reverts back to carb heavy diet and Bgs increase, recommend to increase OZEMPIC to 0.5mg  sq weekly.  Patient verbalizes understanding.   . Diabetic patient on aspirin/statin for prevention as appropriate.  Fill history reviewed.  Rosuvastatin 10mg  #90 DS last filled on 09/09/19.  Patient is ARB (telmisartan) as prescribed. . Will follow up with patient next week  Patient Self Care Activities:  . Currently UNABLE TO independently self inject Ozempic at this time . Attends all scheduled provider appointments . Calls pharmacy for medication refills  Please see past updates related to this goal by clicking on the "Past Updates" button in the selected goal         The patient verbalized understanding of instructions provided today and declined a print copy of patient instruction materials.   The care management team will reach out to the patient again over the next 30 days.   SIGNATURE Regina Eck, PharmD, BCPS Clinical Pharmacist, [facility] [place]  Direct Dial: [phone]

## 2019-10-29 LAB — HM DIABETES EYE EXAM

## 2019-10-30 ENCOUNTER — Encounter: Payer: Self-pay | Admitting: Internal Medicine

## 2019-11-05 ENCOUNTER — Telehealth: Payer: Self-pay | Admitting: Internal Medicine

## 2019-11-05 ENCOUNTER — Other Ambulatory Visit: Payer: Self-pay | Admitting: Pharmacy Technician

## 2019-11-05 ENCOUNTER — Telehealth: Payer: Self-pay

## 2019-11-05 NOTE — Patient Outreach (Signed)
[facility]) Care Management  11/05/2019  ANTWAND AROSTEGUI [birth year] [id number]   Received patient and provider portion(s) of patient assistance application(s) for Ozempic. Will fax completed application and required documents into [employer] on 11/25/2019.  Maud Deed Chana Bode [place] Certified Pharmacy Technician [place] Management Direct [phone]

## 2019-11-05 NOTE — Telephone Encounter (Signed)
I spoke with the patient, and he agreed to come at 2:30 instead of 3:00 on 12/04/2019 (rescheduled AWV to earlier [facility] calendar year).

## 2019-11-26 ENCOUNTER — Ambulatory Visit: Payer: Medicare Other

## 2019-11-26 ENCOUNTER — Encounter: Payer: Medicare Other | Admitting: Internal Medicine

## 2019-11-28 ENCOUNTER — Ambulatory Visit: Payer: Medicare Other

## 2019-11-28 ENCOUNTER — Ambulatory Visit: Payer: Medicare Other | Admitting: Internal Medicine

## 2019-12-04 ENCOUNTER — Other Ambulatory Visit: Payer: Self-pay

## 2019-12-04 ENCOUNTER — Encounter: Payer: Self-pay | Admitting: Internal Medicine

## 2019-12-04 ENCOUNTER — Ambulatory Visit: Payer: Medicare PPO

## 2019-12-04 ENCOUNTER — Telehealth: Payer: Self-pay

## 2019-12-04 ENCOUNTER — Ambulatory Visit (INDEPENDENT_AMBULATORY_CARE_PROVIDER_SITE_OTHER): Payer: Medicare PPO | Admitting: Internal Medicine

## 2019-12-04 VITALS — BP 118/60 | HR 61 | Temp 97.7°F | Ht 67.0 in | Wt 170.0 lb

## 2019-12-04 DIAGNOSIS — I1310 Hypertensive heart and chronic kidney disease without heart failure, with stage 1 through stage 4 chronic kidney disease, or unspecified chronic kidney disease: Secondary | ICD-10-CM

## 2019-12-04 DIAGNOSIS — N183 Chronic kidney disease, stage 3 unspecified: Secondary | ICD-10-CM

## 2019-12-04 DIAGNOSIS — Z6826 Body mass index (BMI) 26.0-26.9, adult: Secondary | ICD-10-CM

## 2019-12-04 DIAGNOSIS — E1122 Type 2 diabetes mellitus with diabetic chronic kidney disease: Secondary | ICD-10-CM

## 2019-12-04 DIAGNOSIS — I2510 Atherosclerotic heart disease of native coronary artery without angina pectoris: Secondary | ICD-10-CM | POA: Diagnosis not present

## 2019-12-04 DIAGNOSIS — Z0000 Encounter for general adult medical examination without abnormal findings: Secondary | ICD-10-CM

## 2019-12-04 DIAGNOSIS — Z8546 Personal history of malignant neoplasm of prostate: Secondary | ICD-10-CM

## 2019-12-04 DIAGNOSIS — L309 Dermatitis, unspecified: Secondary | ICD-10-CM | POA: Diagnosis not present

## 2019-12-04 DIAGNOSIS — E663 Overweight: Secondary | ICD-10-CM

## 2019-12-04 LAB — POCT URINALYSIS DIPSTICK
Bilirubin, UA: NEGATIVE
Blood, UA: NEGATIVE
Glucose, UA: NEGATIVE
Ketones, UA: NEGATIVE
Leukocytes, UA: NEGATIVE
Nitrite, UA: NEGATIVE
Protein, UA: NEGATIVE
Spec Grav, UA: 1.025 (ref 1.010–1.025)
Urobilinogen, UA: 1 E.U./dL
pH, UA: 7 (ref 5.0–8.0)

## 2019-12-04 LAB — POCT UA - MICROALBUMIN
Albumin/Creatinine Ratio, Urine, POC: <30
Creatinine, POC: 100 mg/dL
Microalbumin Ur, POC: 30 mg/L

## 2019-12-04 MED ORDER — CLOTRIMAZOLE-BETAMETHASONE 1-0.05 % EX CREA: TOPICAL_CREAM | CUTANEOUS | 1 refills | Status: AC

## 2019-12-04 NOTE — Progress Notes (Signed)
This visit occurred during the SARS-CoV-2 public health emergency.  Safety protocols were in place, including screening questions prior to the visit, additional usage of staff PPE, and extensive cleaning of exam room while observing appropriate contact time as indicated for disinfecting solutions.  Subjective:   JDYN PARKERSON is a 79 y.o. male who presents for Medicare Annual/Subsequent preventive examination.  Review of Systems:  n/a Cardiac Risk Factors include: advanced age (>81mn, >>20women);diabetes mellitus;hypertension;dyslipidemia;sedentary lifestyle     Objective:    Vitals: BP 118/60 (BP Location: Left Arm, Patient Position: Sitting, Cuff Size: Normal)   Pulse 61   Temp 97.7 F (36.5 C) (Oral)   Ht '5\' 7"'$  (1.702 m)   Wt 170 lb (77.1 kg)   SpO2 96%   BMI 26.63 kg/m   Body mass index is 26.63 kg/m.  Advanced Directives 12/04/2019 06/18/2019 11/08/2018 10/09/2018 12/02/2014  Does Patient Have a Medical Advance Directive? Yes Yes Yes No No  Type of [place]amed[place] will Living will - -  Does patient want to make changes to medical advance directive? - No - Patient declined - - -  Copy of [place]? No - copy requested No - copy requested - - -  Would patient like information on creating a medical advance directive? - - - No - Patient declined No - patient declined information    Tobacco Social History   Tobacco Use  Smoking Status Former Smoker  . Packs/day: 0.25  . Years: 20.00  . Pack years: 5.00  . Types: Cigarettes  . Quit date: 11/21/1984  . Years since quitting: 35.0  Smokeless Tobacco Never Used     Counseling given: Not Answered   Clinical Intake:  Pre-visit preparation completed: Yes  Pain : No/denies pain     Nutritional Status: BMI 25 -29 Overweight Nutritional Risks: None Diabetes: Yes  How often do you need to have someone help you when  you read instructions, pamphlets, or other written materials from your doctor or pharmacy?: 1 - Never What is the last grade level you completed in school?: post grad  Interpreter Needed?: No  Information entered by :: NAllen LPN  Past Medical History:  Diagnosis Date  . Chronic kidney disease   . Coronary artery disease   . Gout   . Hyperlipidemia   . Hypertension   . Type 2 diabetes mellitus ([place]    Past Surgical History:  Procedure Laterality Date  . CARDIAC CATHETERIZATION  01/16/2004   multi-vessel coronary disease 40-50% Left main, 100% mid LAD75-80% ostial stenosis  . CARDIOVASCULAR STRESS TEST  08/09/2004   post CABG, low risk scan  . CARDIOVASCULAR STRESS TEST  10/23/2003   mild septal ischemia, EF43%,   . CORONARY ARTERY BYPASS GRAFT  02/05/2004   CABGx3 Dr.Bartle  . Lower Ext. Doppler  10/20/2011   Right Anterior Tibial: occlusive disease, Left Anterior Tibial occluded w/reconstitution at ankle, mildly abnormal LEA  . NM MYOCAR PERF WALL MOTION  06/07/2011   protocol:Persantine, EF60%, negative for ischemia, low risk scan  . NM MYOCAR PERF WALL MOTION  08/05/2009   protocol: Persantine, EF57%. no evidence of ischemia, low risk scan.   . TRANSPERINEAL IMPLANT OF RADIATION SEEDS W/ ULTRASOUND     Family History  Problem Relation Age of Onset  . Asthma Mother   . Heart disease Father   . Stroke Father   . Heart attack Father 681 . Cancer  Sister 30  . Diabetes Brother   . Heart disease Maternal Grandmother    Social History   Socioeconomic History  . Marital status: Divorced    Spouse name: Not on file  . Number of children: Not on file  . Years of education: Not on file  . Highest education level: Not on file  Occupational History  . Occupation: retired  Tobacco Use  . Smoking status: Former Smoker    Packs/day: 0.25    Years: 20.00    Pack years: 5.00    Types: Cigarettes    Quit date: 11/21/1984    Years since quitting: 35.0  . Smokeless tobacco:  Never Used  Substance and Sexual Activity  . Alcohol use: Not Currently  . Drug use: Not Currently  . Sexual activity: Yes  Other Topics Concern  . Not on file  Social History Narrative  . Not on file   Social Determinants of Health   Financial Resource Strain: Low Risk   . Difficulty of Paying Living Expenses: Not hard at all  Food Insecurity: No Food Insecurity  . Worried About Charity fundraiser in the Last Year: Never true  . Ran Out of Food in the Last Year: Never true  Transportation Needs: No Transportation Needs  . Lack of Transportation (Medical): No  . Lack of Transportation (Non-Medical): No  Physical Activity: Inactive  . Days of Exercise per Week: 0 days  . Minutes of Exercise per Session: 0 min  Stress: No Stress Concern Present  . Feeling of Stress : Not at all  Social Connections:   . Frequency of Communication with Friends and Family: Not on file  . Frequency of Social Gatherings with Friends and Family: Not on file  . Attends Religious Services: Not on file  . Active Member of Clubs or Organizations: Not on file  . Attends Archivist Meetings: Not on file  . Marital Status: Not on file    Outpatient Encounter Medications as of 12/04/2019  Medication Sig  . allopurinol (ZYLOPRIM) 100 MG tablet TAKE 1/2 TABLET BY MOUTH EVERY DAY  . aspirin 81 MG tablet Take 81 mg by mouth daily.  . cholecalciferol (VITAMIN D) 400 UNITS TABS tablet Take 5,000 Units by mouth.  . Diclofenac Sodium 3 % GEL Place onto the skin.  . Flaxseed, Linseed, (FLAX SEEDS PO) Take 1 tablet by mouth daily.  [name] ketoconazole (NIZORAL) 2 % cream Apply 1 application topically daily.  [name] lidocaine (LIDODERM) 5 % Place 1 patch onto the skin daily. Remove & Discard patch within 12 hours or as directed by MD Place over left lower back.  . Linaclotide (LINZESS) 290 MCG CAPS capsule Take 290 mcg by mouth daily.  . metoprolol succinate (TOPROL-XL) 50 MG 24 hr tablet TAKE 1 TABLET BY MOUTH  ONCE DAILY  . rosuvastatin (CRESTOR) 10 MG tablet TAKE 1 TABLET BY MOUTH EVERY DAY  . Semaglutide,0.25 or 0.'5MG'$ /DOS, (OZEMPIC, 0.25 OR 0.5 MG/DOSE,) 2 MG/1.5ML SOPN Inject 0.25 mg into the skin once a week.  . sildenafil (VIAGRA) 50 MG tablet Take 1 tablet (50 mg total) by mouth daily as needed for erectile dysfunction.  [name] telmisartan (MICARDIS) 20 MG tablet TAKE 1 TABLET BY MOUTH EVERY DAY   No facility-administered encounter medications on file as of 12/04/2019.    Activities of Daily Living In your present state of health, do you have any difficulty performing the following activities: 12/04/2019 06/18/2019  Hearing? N N  Vision? N N  Difficulty concentrating or making decisions? N N  Walking or climbing stairs? N N  Dressing or bathing? N N  Doing errands, shopping? N N  Preparing Food and eating ? N N  Using the Toilet? N N  In the past six months, have you accidently leaked urine? N N  Do you have problems with loss of bowel control? N N  Managing your Medications? N N  Managing your Finances? N N  Housekeeping or managing your Housekeeping? N N  Some recent data might be hidden    Patient Care Team: Glendale Chard, MD as PCP - General (Internal Medicine) Lorretta Harp, MD as PCP - Cardiology (Cardiology) Lavera Guise, [facility] (Pharmacist) Rex Kras Claudette Stapler, RN as Case Manager Adaline Sill, CPhT as [facility] (Pharmacy Technician)   Assessment:   This is a routine wellness examination for Michaela.  Exercise Activities and Dietary recommendations Current Exercise Habits: The patient does not participate in regular exercise at present  Goals    . "I would like to learn more about portion control for my diabetes" (pt-stated)     Current Barriers:  [name] Knowledge Deficits related to disease process and Self Health Management of Diabetes . A1C 7.6 (obtained on 09/24/19)  Nurse Case Manager Clinical Goal(s):  [name] Over the next 90 days, patient  will work with the CCM team to address needs related to diabetes Self Health management.   Goal Met . Over the next 30 days, patient will have received and reviewed the printed DM patient educational materials; Manage Your Diabetes Zone Tool; The Diabetes Diet; Controlling Your Diabetes Using the Plate Method; s/s Hypo/Hyperglycemia Goal Met . New - 10/09/19 - Over the next 90 days, patient will report improved Self Health Management of his Diabetes as evidence by patient will achieve daily glycemic control 80-130 and patient will lower his A1C <7.6  CCM RN CM  Interventions:  10/08/19 call completed with patient  . Evaluation of current treatment plan related to diabetes and patient's adherence to plan as established by provider. . Provided education to patient re: current A1C of 7.6 obtained on 09/24/19; discussed target A1C of <7.0; Reviewed and reiterated ADA recommendations to help lower this number including, following a low carb diabetic diet; taking diabetic medications exactly as prescribed, implementing exercise into daily routine; patient admits he was drinking Gingerale and eating more pasta; discussed since learning his A1C is elevated, he has already made the necessary changes in his diet and has started exercising  . Reviewed medications with patient and discussed current use of Ozempic 0.25 mg weekly injection, patient continues to have injections every Wednesday at PCP office; discussed patient is working with embedded Pharm D Lottie Dawson for financial assistance with cost of Ozempic; Sent in bask message to embedded Pharm D Lottie Dawson requesting f/u with patient and recommendations for increasing his Ozempic to 0.5 mg weekly; discussed patient has a planned visit at the office tomorrow, 10/09/19   . Discussed plans with patient for ongoing care management follow up and provided patient with direct contact information for care management team . Provided patient with printed educational  materials related to Diabetes Management Safety Zone Tool, Carb Counting, Carb Choices, Life's Simple 7  . Advised patient, providing education and rationale, to check cbg before meals and record, calling the CCM team and or PCP provider for findings outside established parameters.  Target FBS (80-130)  Patient Self Care Activities:  . Self administers medications as  prescribed . Attends all scheduled provider appointments . Calls pharmacy for medication refills . Performs ADL's independently . Performs IADL's independently . Calls provider office for new concerns or questions  Please see past updates related to this goal by clicking on the "Past Updates" button in the selected goal      . DIET - INCREASE WATER INTAKE (pt-stated)    . Exercise 150 min/wk Moderate Activity (pt-stated)    . Exercise 150 min/wk Moderate Activity     12/04/2019, wants to exercise more, decrease sweet intake, drink more water    . I would like to apply for financial assistance for Ozempic & continue to manage my diabetes (pt-stated)     Current Barriers:  . Financial Barriers . Unable to self-inject Ozempic  Pharmacist Clinical Goal(s):  [name] Over the next 90 days, patient will demonstrate Improved medication adherence as evidenced by attempting to self inject Ozempic goal re-established on 10/23/19 for 2021 goals of care . Over the next 60 days, patient will work with CCM PharmD to address needs related to applying for patient assistance (Ozempic) goal re-established on 10/23/19 for 2021 goals of care  Interventions: Face to face clinic visit on 10/23/19 . Comprehensive medication review performed. . Discussed plans with patient for ongoing care management follow up and provided patient with direct contact information for care management team . Patient continues to tolerate Ozempic 0.'25mg'$  weekly without adverse events.  He denies adverse events of hypoglycemia.  He is unable to inject Ozempic (patient fearful of  self-injecting) and comes into the office to have Ozempic injection every Wednesday.  Patient was able to inject Ozempic with the assistance of the CMA.  He was able to hold injection in stomach region for 3-5 seconds and remove.  He states, "give me some time and I will try to inject".  Plan is needed for patient to self-inject as coming to PCP office weekly may not be feasible in the near future.  Patient verbalizes understanding.  Will continue to provide support regarding self-injection techniques.  Appreciated the assistance of CMA, [place] . Patient has been approved for Ozempic patient assistance until 11/21/19. Refills requested from [facility] last month (last refill for 2020).  Will reapply with patient in January 2021.  Patient filled out paperwork, however will need to bring in financials . Last A1c was 7.6% on 09/24/19 (7.3% in July 2020).  [name] states that patient assistance will allow him to maintain blood sugars within goal range.  He reports most FBG in the 150-180, denies hypoglycemia. We discussed meals.  Patient reports he is doing better with his diet and has cut carbs since we spoke last week.  He is avoiding sugary drinks.  Encouraged patient to adhere to diabetes-healthy diet and exercise as able and as recommended by PCP.  Patient verbalizes understanding. o Will continue current dose of Ozempic 0.'25mg'$  weekly.  If patient reverts back to carb heavy diet and Bgs increase, recommend to increase OZEMPIC to 0.'5mg'$  sq weekly.  Patient verbalizes understanding.   . Diabetic patient on aspirin/statin for prevention as appropriate.  Fill history reviewed.  Rosuvastatin '10mg'$  #90 DS last filled on 09/09/19.  Patient is ARB (telmisartan) as prescribed. . Will follow up with patient next week  Patient Self Care Activities:  . Currently UNABLE TO independently self inject Ozempic at this time . Attends all scheduled provider appointments . Calls pharmacy for medication refills  Please see past  updates related to this goal by clicking on the "Past  Updates" button in the selected goal         Fall Risk Fall Risk  12/04/2019 06/18/2019 03/14/2019 11/08/2018 10/10/2018  Falls in the past year? 0 0 0 0 0  Risk for fall due to : Medication side effect Medication side effect - - -  Follow up Falls evaluation completed;Education provided;Falls prevention discussed Falls evaluation completed;Education provided;Falls prevention discussed - - -   Is the patient's home free of loose throw rugs in walkways, pet beds, electrical cords, etc?   yes      Grab bars in the bathroom? no      Handrails on the stairs?   yes      Adequate lighting?   yes  Timed Get Up and Go Performed: n/a  Depression Screen PHQ 2/9 Scores 12/04/2019 06/18/2019 03/14/2019 11/08/2018  PHQ - 2 Score 0 0 0 0  PHQ- 9 Score 0 0 - 0    Cognitive Function     6CIT Screen 12/04/2019 06/18/2019 11/08/2018  What Year? 0 points 0 points 0 points  What month? 0 points 0 points 0 points  What time? 0 points 0 points 0 points  Count back from 20 0 points 0 points 0 points  Months in reverse 0 points 0 points 0 points  Repeat phrase 4 points 0 points 0 points  Total Score 4 0 0    Immunization History  Administered Date(s) Administered  . Influenza-Unspecified 08/21/2018  . Pneumococcal Conjugate-13 09/11/2019  . Pneumococcal Polysaccharide-23 11/04/2009, 10/04/2016    Qualifies for Shingles Vaccine? yes  Screening Tests Health Maintenance  Topic Date Due  . HEMOGLOBIN A1C  03/23/2020  . OPHTHALMOLOGY EXAM  10/28/2020  . FOOT EXAM  12/03/2020  . TETANUS/TDAP  02/28/2022  . INFLUENZA VACCINE  Completed  . PNA vac Low Risk Adult  Completed   Cancer Screenings: Lung: Low Dose CT Chest recommended if Age 33-80 years, 30 pack-year currently smoking OR have quit w/in 15years. Patient does not qualify. Colorectal: up to date  Additional Screenings:  Hepatitis C Screening:n/a      Plan:    Patient wants to  decrease sweet intake, drink more water and exercise more.  I have personally reviewed and noted the following in the patient's chart:   . Medical and social history . Use of alcohol, tobacco or illicit drugs  . Current medications and supplements . Functional ability and status . Nutritional status . Physical activity . Advanced directives . List of other physicians . Hospitalizations, surgeries, and ER visits in previous 12 months . Vitals . Screenings to include cognitive, depression, and falls . Referrals and appointments  In addition, I have reviewed and discussed with patient certain preventive protocols, quality metrics, and best practice recommendations. A written personalized care plan for preventive services as well as general preventive health recommendations were provided to patient.     Kellie Simmering, LPN  [id number]

## 2019-12-04 NOTE — Progress Notes (Signed)
This visit occurred during the SARS-CoV-2 public health emergency.  Safety protocols were in place, including screening questions prior to the visit, additional usage of staff PPE, and extensive cleaning of exam room while observing appropriate contact time as indicated for disinfecting solutions.  Subjective:     Patient ID: Jose Ponce , male    DOB: 01/10/1941 , 79 y.o.   MRN: [id number]   Chief Complaint  Patient presents with  . Annual Exam  . Diabetes  . Hypertension    HPI  He is here today for a full physical examination. He admits that he has not been seen by Urology in the past year. He reports that he was not given a f/u appt. He does have h/o prostate cancer.   Diabetes He presents for his follow-up diabetic visit. He has type 2 diabetes mellitus. His disease course has been stable. There are no hypoglycemic associated symptoms. Pertinent negatives for hypoglycemia include no headaches. Pertinent negatives for diabetes include no blurred vision and no chest pain. There are no hypoglycemic complications. Diabetic complications include nephropathy. Risk factors for coronary artery disease include diabetes mellitus, dyslipidemia, hypertension and male sex. He participates in exercise intermittently. An ACE inhibitor/angiotensin II receptor blocker is being taken. Eye exam is current.  Hypertension This is a chronic problem. The current episode started more than 1 year ago. The problem has been gradually improving since onset. Pertinent negatives include no blurred vision, chest pain, headaches, palpitations or shortness of breath.     Past Medical History:  Diagnosis Date  . Chronic kidney disease   . Coronary artery disease   . Gout   . Hyperlipidemia   . Hypertension   . Type 2 diabetes mellitus (HCC)      Family History  Problem Relation Age of Onset  . Asthma Mother   . Heart disease Father   . Stroke Father   . Heart attack Father 60  . Cancer Sister 37  .  Diabetes Brother   . Heart disease Maternal Grandmother      Current Outpatient Medications:  .  allopurinol (ZYLOPRIM) 100 MG tablet, TAKE 1/2 TABLET BY MOUTH EVERY DAY, Disp: 45 tablet, Rfl: 1 .  aspirin 81 MG tablet, Take 81 mg by mouth daily., Disp: , Rfl:  .  cholecalciferol (VITAMIN D) 400 UNITS TABS tablet, Take 5,000 Units by mouth., Disp: , Rfl:  .  Diclofenac Sodium 3 % GEL, Place onto the skin., Disp: , Rfl:  .  Flaxseed, Linseed, (FLAX SEEDS PO), Take 1 tablet by mouth daily., Disp: , Rfl:  .  ketoconazole (NIZORAL) 2 % cream, Apply 1 application topically daily., Disp: , Rfl:  .  lidocaine (LIDODERM) 5 %, Place 1 patch onto the skin daily. Remove & Discard patch within 12 hours or as directed by MD Place over left lower back., Disp: 30 patch, Rfl: 0 .  Linaclotide (LINZESS) 290 MCG CAPS capsule, Take 290 mcg by mouth daily., Disp: , Rfl:  .  metoprolol succinate (TOPROL-XL) 50 MG 24 hr tablet, TAKE 1 TABLET BY MOUTH ONCE DAILY, Disp: 30 tablet, Rfl: 5 .  rosuvastatin (CRESTOR) 10 MG tablet, TAKE 1 TABLET BY MOUTH EVERY DAY, Disp: 90 tablet, Rfl: 1 .  Semaglutide,0.25 or 0.5MG /DOS, (OZEMPIC, 0.25 OR 0.5 MG/DOSE,) 2 MG/1.5ML SOPN, Inject 0.25 mg into the skin once a week., Disp: 3 pen, Rfl: 3 .  sildenafil (VIAGRA) 50 MG tablet, Take 1 tablet (50 mg total) by mouth daily as needed for erectile  dysfunction., Disp: 30 tablet, Rfl: 0 .  telmisartan (MICARDIS) 20 MG tablet, TAKE 1 TABLET BY MOUTH EVERY DAY, Disp: 90 tablet, Rfl: 0   No Known Allergies   Men's preventive visit. Patient Health Questionnaire (PHQ-2) is    Clinical Support from 12/04/2019 in [facility]  PHQ-2 Total Score  0     Patient is on a healthy diet. Marital status: Divorced. Relevant history for alcohol use is:  Social History   Substance and Sexual Activity  Alcohol Use Not Currently  . Relevant history for tobacco use is:  Social History   Tobacco Use  Smoking Status Former  Smoker  . Packs/day: 0.25  . Years: 20.00  . Pack years: 5.00  . Types: Cigarettes  . Quit date: 11/21/1984  . Years since quitting: 35.0  Smokeless Tobacco Never Used  .  Review of Systems  Constitutional: Negative.   HENT: Negative.   Eyes: Negative.  Negative for blurred vision.  Respiratory: Negative.  Negative for shortness of breath.   Cardiovascular: Negative.  Negative for chest pain and palpitations.  Endocrine: Negative.   Genitourinary: Negative.   Musculoskeletal: Negative.   Skin: Positive for rash.       He c/o scaly, itchy rash on his left foot. He is not sure what triggered the rash. He has tried some neosporin without improvement.   Allergic/Immunologic: Negative.   Neurological: Negative.  Negative for headaches.  Hematological: Negative.   Psychiatric/Behavioral: Negative.      Today's Vitals   12/04/19 1509  BP: 118/60  Pulse: 61  Temp: 97.7 F (36.5 C)  TempSrc: Oral  Weight: 170 lb (77.1 kg)  Height: 5\' 7"  (1.702 m)  PainSc: 0-No pain   Body mass index is 26.63 kg/m.   Objective:  Physical Exam Vitals and nursing note reviewed.  Constitutional:      Appearance: Normal appearance.  HENT:     Head: Normocephalic and atraumatic.     Right Ear: Tympanic membrane, ear canal and external ear normal.     Left Ear: Tympanic membrane, ear canal and external ear normal.     Nose:     Comments: Deferred, masked    Mouth/Throat:     Comments: Deferred, masked Eyes:     Extraocular Movements: Extraocular movements intact.     Conjunctiva/sclera: Conjunctivae normal.     Pupils: Pupils are equal, round, and reactive to light.  Cardiovascular:     Rate and Rhythm: Normal rate and regular rhythm.     Pulses:          Dorsalis pedis pulses are 1+ on the right side and 1+ on the left side.     Heart sounds: Normal heart sounds.  Pulmonary:     Effort: Pulmonary effort is normal.     Breath sounds: Normal breath sounds.     Comments: Healed sternal  scar Chest:     Breasts:        Right: Normal. No swelling, bleeding, inverted nipple, mass or nipple discharge.        Left: Normal. No swelling, bleeding, inverted nipple, mass or nipple discharge.  Abdominal:     General: Bowel sounds are normal.     Palpations: Abdomen is soft.     Comments: Rounded   Genitourinary:    Comments: Deferred, per patient's request Musculoskeletal:        General: Normal range of motion.     Cervical back: Normal range of motion and neck supple.  Feet:     Right foot:     Protective Sensation: 5 sites tested. 5 sites sensed.     Skin integrity: Dry skin present.     Toenail Condition: Right toenails are abnormally thick.     Left foot:     Protective Sensation: 5 sites tested. 5 sites sensed.     Skin integrity: Dry skin present.     Toenail Condition: Left toenails are abnormally thick.     Comments: Scaly, erythematous rash on dorsum of left foot Skin:    General: Skin is warm.  Neurological:     General: No focal deficit present.     Mental Status: He is alert.  Psychiatric:        Mood and Affect: Mood normal.        Behavior: Behavior normal.         Assessment And Plan:     1. Routine general medical examination at health care facility  A full exam was performed.  DRE deferred, per patients request. I will refer him to Urology for prostate exam. PATIENT IS ADVISED TO GET 30-45 MINUTES REGULAR EXERCISE NO LESS THAN FOUR TO FIVE DAYS PER WEEK - BOTH WEIGHTBEARING EXERCISES AND AEROBIC ARE RECOMMENDED.  HE IS ADVISED TO FOLLOW A HEALTHY DIET WITH AT LEAST SIX FRUITS/VEGGIES PER DAY, DECREASE INTAKE OF RED MEAT, AND TO INCREASE FISH INTAKE TO TWO DAYS PER WEEK.  MEATS/FISH SHOULD NOT BE FRIED, BAKED OR BROILED IS PREFERABLE.  I SUGGEST WEARING SPF 50 SUNSCREEN ON EXPOSED PARTS AND ESPECIALLY WHEN IN THE DIRECT SUNLIGHT FOR AN EXTENDED PERIOD OF TIME.  PLEASE AVOID FAST FOOD RESTAURANTS AND INCREASE YOUR WATER INTAKE.   2. Diabetes mellitus  with stage 3 chronic kidney disease ([place])  Diabetic foot exam was performed. I will check CBC, CMP, hba1c today. I DISCUSSED WITH THE PATIENT AT LENGTH REGARDING THE GOALS OF GLYCEMIC CONTROL AND POSSIBLE LONG-TERM COMPLICATIONS.  I  ALSO STRESSED THE IMPORTANCE OF COMPLIANCE WITH HOME GLUCOSE MONITORING, DIETARY RESTRICTIONS INCLUDING AVOIDANCE OF SUGARY DRINKS/PROCESSED FOODS,  ALONG WITH REGULAR EXERCISE.  I  ALSO STRESSED THE IMPORTANCE OF ANNUAL EYE EXAMS, SELF FOOT CARE AND COMPLIANCE WITH OFFICE VISITS.   3. Benign hypertensive heart and renal disease  Chronic, well controlled. He will continue with current meds. He is encouraged to avoid adding salt in his foods. EKG performed, NSR w/ RBBB. No new changes noted.   - EKG 12-Lead  4. Coronary artery disease involving native coronary artery of native heart without angina pectoris  Chronic, yet stable. Importance of dietary, exercise and medication compliance was discussed with the patient. I will also refer him to Cardiology for f/u as requested.   5. Overweight with body mass index (BMI) of 26 to 26.9 in adult  He is at a healthy weight for his age group. He is encouraged to still exercise at least 150 minutes per week.    6. Personal history of prostate cancer  - Ambulatory referral to Urology  7. Dermatitis  He was given rx lotrisone cream to apply to affected area twice daily as needed. He will let me know if his sx persist.    [name], MD    THE PATIENT IS ENCOURAGED TO PRACTICE SOCIAL DISTANCING DUE TO THE COVID-19 PANDEMIC.

## 2019-12-04 NOTE — Patient Instructions (Signed)

## 2019-12-04 NOTE — Patient Instructions (Signed)
Jose Ponce , Thank you for taking time to come for your Medicare Wellness Visit. I appreciate your ongoing commitment to your health goals. Please review the following plan we discussed and let me know if I can assist you in the future.   Screening recommendations/referrals: Colonoscopy: 06/2014 Recommended yearly ophthalmology/optometry visit for glaucoma screening and checkup Recommended yearly dental visit for hygiene and checkup  Vaccinations: Influenza vaccine: 08/2019 Pneumococcal vaccine: 08/2019 Tdap vaccine: 02/2012 Shingles vaccine: 12/2017    Advanced directives: Please bring a copy of your POA (Power of [place]) and/or Living Will to your next appointment.    Conditions/risks identified: overweight  Next appointment: 04/01/2020 at 11:30  Preventive Care 78 Years and Older, Male Preventive care refers to lifestyle choices and visits with your health care provider that can promote health and wellness. What does preventive care include?  A yearly physical exam. This is also called an annual well check.  Dental exams once or twice a year.  Routine eye exams. Ask your health care provider how often you should have your eyes checked.  Personal lifestyle choices, including:  Daily care of your teeth and gums.  Regular physical activity.  Eating a healthy diet.  Avoiding tobacco and drug use.  Limiting alcohol use.  Practicing safe sex.  Taking low doses of aspirin every day.  Taking vitamin and mineral supplements as recommended by your health care provider. What happens during an annual well check? The services and screenings done by your health care provider during your annual well check will depend on your age, overall health, lifestyle risk factors, and family history of disease. Counseling  Your health care provider may ask you questions about your:  Alcohol use.  Tobacco use.  Drug use.  Emotional well-being.  Home and relationship well-being.   Sexual activity.  Eating habits.  History of falls.  Memory and ability to understand (cognition).  Work and work Statistician. Screening  You may have the following tests or measurements:  Height, weight, and BMI.  Blood pressure.  Lipid and cholesterol levels. These may be checked every 5 years, or more frequently if you are over 67 years old.  Skin check.  Lung cancer screening. You may have this screening every year starting at age 69 if you have a 30-pack-year history of smoking and currently smoke or have quit within the past 15 years.  Fecal occult blood test (FOBT) of the stool. You may have this test every year starting at age 57.  Flexible sigmoidoscopy or colonoscopy. You may have a sigmoidoscopy every 5 years or a colonoscopy every 10 years starting at age 48.  Prostate cancer screening. Recommendations will vary depending on your family history and other risks.  Hepatitis C blood test.  Hepatitis B blood test.  Sexually transmitted disease (STD) testing.  Diabetes screening. This is done by checking your blood sugar (glucose) after you have not eaten for a while (fasting). You may have this done every 1-3 years.  Abdominal aortic aneurysm (AAA) screening. You may need this if you are a current or former smoker.  Osteoporosis. You may be screened starting at age 55 if you are at high risk. Talk with your health care provider about your test results, treatment options, and if necessary, the need for more tests. Vaccines  Your health care provider may recommend certain vaccines, such as:  Influenza vaccine. This is recommended every year.  Tetanus, diphtheria, and acellular pertussis (Tdap, Td) vaccine. You may need a Td booster every  10 years.  Zoster vaccine. You may need this after age 5.  Pneumococcal 13-valent conjugate (PCV13) vaccine. One dose is recommended after age 12.  Pneumococcal polysaccharide (PPSV23) vaccine. One dose is recommended after  age 31. Talk to your health care provider about which screenings and vaccines you need and how often you need them. This information is not intended to replace advice given to you by your health care provider. Make sure you discuss any questions you have with your health care provider. Document Released: 12/04/2015 Document Revised: 07/27/2016 Document Reviewed: 09/08/2015 Elsevier Interactive Patient Education  2017 [employer] Prevention in the Home Falls can cause injuries. They can happen to people of all ages. There are many things you can do to make your home safe and to help prevent falls. What can I do on the outside of my home?  Regularly fix the edges of walkways and driveways and fix any cracks.  Remove anything that might make you trip as you walk through a door, such as a raised step or threshold.  Trim any bushes or trees on the path to your home.  Use bright outdoor lighting.  Clear any walking paths of anything that might make someone trip, such as rocks or tools.  Regularly check to see if handrails are loose or broken. Make sure that both sides of any steps have handrails.  Any raised decks and porches should have guardrails on the edges.  Have any leaves, snow, or ice cleared regularly.  Use sand or salt on walking paths during winter.  Clean up any spills in your garage right away. This includes oil or grease spills. What can I do in the bathroom?  Use night lights.  Install grab bars by the toilet and in the tub and shower. Do not use towel bars as grab bars.  Use non-skid mats or decals in the tub or shower.  If you need to sit down in the shower, use a plastic, non-slip stool.  Keep the floor dry. Clean up any water that spills on the floor as soon as it happens.  Remove soap buildup in the tub or shower regularly.  Attach bath mats securely with double-sided non-slip rug tape.  Do not have throw rugs and other things on the floor that can  make you trip. What can I do in the bedroom?  Use night lights.  Make sure that you have a light by your bed that is easy to reach.  Do not use any sheets or blankets that are too big for your bed. They should not hang down onto the floor.  Have a firm chair that has side arms. You can use this for support while you get dressed.  Do not have throw rugs and other things on the floor that can make you trip. What can I do in the kitchen?  Clean up any spills right away.  Avoid walking on wet floors.  Keep items that you use a lot in easy-to-reach places.  If you need to reach something above you, use a strong step stool that has a grab bar.  Keep electrical cords out of the way.  Do not use floor polish or wax that makes floors slippery. If you must use wax, use non-skid floor wax.  Do not have throw rugs and other things on the floor that can make you trip. What can I do with my stairs?  Do not leave any items on the stairs.  Make sure  that there are handrails on both sides of the stairs and use them. Fix handrails that are broken or loose. Make sure that handrails are as long as the stairways.  Check any carpeting to make sure that it is firmly attached to the stairs. Fix any carpet that is loose or worn.  Avoid having throw rugs at the top or bottom of the stairs. If you do have throw rugs, attach them to the floor with carpet tape.  Make sure that you have a light switch at the top of the stairs and the bottom of the stairs. If you do not have them, ask someone to add them for you. What else can I do to help prevent falls?  Wear shoes that:  Do not have high heels.  Have rubber bottoms.  Are comfortable and fit you well.  Are closed at the toe. Do not wear sandals.  If you use a stepladder:  Make sure that it is fully opened. Do not climb a closed stepladder.  Make sure that both sides of the stepladder are locked into place.  Ask someone to hold it for you,  if possible.  Clearly mark and make sure that you can see:  Any grab bars or handrails.  First and last steps.  Where the edge of each step is.  Use tools that help you move around (mobility aids) if they are needed. These include:  Canes.  Walkers.  Scooters.  Crutches.  Turn on the lights when you go into a dark area. Replace any light bulbs as soon as they burn out.  Set up your furniture so you have a clear path. Avoid moving your furniture around.  If any of your floors are uneven, fix them.  If there are any pets around you, be aware of where they are.  Review your medicines with your doctor. Some medicines can make you feel dizzy. This can increase your chance of falling. Ask your doctor what other things that you can do to help prevent falls. This information is not intended to replace advice given to you by your health care provider. Make sure you discuss any questions you have with your health care provider. Document Released: 09/03/2009 Document Revised: 04/14/2016 Document Reviewed: 12/12/2014 Elsevier Interactive Patient Education  2017 [employer].

## 2019-12-05 ENCOUNTER — Other Ambulatory Visit: Payer: Self-pay | Admitting: Internal Medicine

## 2019-12-05 LAB — LIPID PANEL
Chol/HDL Ratio: 2.8 ratio (ref 0.0–5.0)
Cholesterol, Total: 122 mg/dL (ref 100–199)
HDL: 43 mg/dL (ref >39)
LDL Chol Calc (NIH): 66 mg/dL (ref 0–99)
Triglycerides: 62 mg/dL (ref 0–149)
VLDL Cholesterol Cal: 13 mg/dL (ref 5–40)

## 2019-12-05 LAB — CBC
Hematocrit: 42.7 % (ref 37.5–51.0)
Hemoglobin: 15 g/dL (ref 13.0–17.7)
MCH: 32.5 pg (ref 26.6–33.0)
MCHC: 35.1 g/dL (ref 31.5–35.7)
MCV: 92 fL (ref 79–97)
Platelets: 219 10*3/uL (ref 150–450)
RBC: 4.62 x10E6/uL (ref 4.14–5.80)
RDW: 12.2 % (ref 11.6–15.4)
WBC: 4.8 10*3/uL (ref 3.4–10.8)

## 2019-12-05 LAB — HEMOGLOBIN A1C
Est. average glucose Bld gHb Est-mCnc: 186 mg/dL
Hgb A1c MFr Bld: 8.1 % — ABNORMAL HIGH (ref 4.8–5.6)

## 2019-12-06 ENCOUNTER — Telehealth: Payer: Self-pay

## 2019-12-06 NOTE — Telephone Encounter (Signed)
Left the patient a message to call back for lab results. 

## 2019-12-09 ENCOUNTER — Telehealth: Payer: Self-pay

## 2019-12-09 NOTE — Telephone Encounter (Signed)
Spoke w/pt gave him provider message Pt stated he is not sure what happen, he will start to work out he does not have a treadmill at home, and he would like to think about the other medication a little longer before he agrees to taking it  Your blood count is nl. Your hba1c is 8.1, this has gone up. What happened? Are you exercising every day? If not, please start a program where you exercise 30 minutes five days per week. Do you have any exercise equipment at home? Treadmill? You may walk on treadmill while watching TV. I also need to start you on a medication called [place]. It will help to lower your blood sugar. The addition of another medication is necessary b/c you do not tolerate the higher dose of Ozempic. This medication will cause you to urinate excess glucose from your system, which may cause urinary frequency. Let me know how you wish to proceed. Your chol looks great. Continue with current meds.

## 2019-12-13 ENCOUNTER — Other Ambulatory Visit: Payer: Self-pay | Admitting: Pharmacy Technician

## 2019-12-13 ENCOUNTER — Ambulatory Visit (INDEPENDENT_AMBULATORY_CARE_PROVIDER_SITE_OTHER): Payer: Medicare PPO | Admitting: Cardiovascular Disease

## 2019-12-13 ENCOUNTER — Encounter: Payer: Self-pay | Admitting: Cardiovascular Disease

## 2019-12-13 ENCOUNTER — Other Ambulatory Visit: Payer: Self-pay

## 2019-12-13 VITALS — BP 131/76 | HR 69 | Temp 97.3°F | Ht 67.0 in | Wt 173.0 lb

## 2019-12-13 DIAGNOSIS — I10 Essential (primary) hypertension: Secondary | ICD-10-CM | POA: Diagnosis not present

## 2019-12-13 DIAGNOSIS — I2510 Atherosclerotic heart disease of native coronary artery without angina pectoris: Secondary | ICD-10-CM | POA: Diagnosis not present

## 2019-12-13 DIAGNOSIS — E7800 Pure hypercholesterolemia, unspecified: Secondary | ICD-10-CM | POA: Diagnosis not present

## 2019-12-13 NOTE — Patient Instructions (Signed)
Medication Instructions:  Your physician recommends that you continue on your current medications as directed. Please refer to the Current Medication list given to you today.  *If you need a refill on your cardiac medications before your next appointment, please call your pharmacy*  Lab Work: NONE  Testing/Procedures: NONE  Follow-Up: At [facility], you and your health needs are our priority.  As part of our continuing mission to provide you with exceptional heart care, we have created designated Provider Care Teams.  These Care Teams include your primary Cardiologist (physician) and Advanced Practice Providers (APPs -  Physician Assistants and Nurse Practitioners) who all work together to provide you with the care you need, when you need it.  Your next appointment:   1 year(s) You will receive a reminder letter in the mail two months in advance. If you don't receive a letter, please call our office to schedule the follow-up appointment.  The format for your next appointment:   In Person  Provider:   Skeet Latch, MD

## 2019-12-13 NOTE — Progress Notes (Signed)
Cardiology Office Note   Date:  12/13/2019   ID:  Jose, Ponce 10-17-1941, MRN [id number]  PCP:  Jose Chard, MD  Cardiologist:   Jose Latch, MD   No chief complaint on file.    History of Present Illness: Jose Ponce is a 79 y.o. male with CAD s/p CABG (LIMA-->LAD, SVG-->D, SVG-->OM, SVG-->LCx in 2005), hypertension, hyperlipidemia, and DM who presents for follow up.  He last had a Lexiscan Myoview 05/2011 that revealed LVEF 57% and was negative for ischemia.  Prior to his CABG he noted his gait was unstable.  He was referred for a stress thest that was abnormal and subsequent cath showed obstructive CAD.  He underwent CABG in 2005.  Since that time he has been feeling well.  He has no chest pain or shortness of breath.  He does not get much formal exercise but is very active.  He denies lower extremity edema, orthopnea, or PND.  He also has not noted any lightheadedness or dizziness.  He was diagnosed with prostate Ponce 5 years ago and had radioactive seeds implanted.  His PSA has been well-controlled since that time.  He quit smoking in 1986 after a 10 year history of smoking less than 1 ppd.   Since his last appointment Jose Ponce has been well.  He has not been getting much exercise lately.  He also had poor eating habits over the holidays.  He thinks this is why his hemoglobin A1c was elevated when he last saw Jose Ponce.  Overall he has been feeling well.  He has no chest pain or shortness of breath.  He denies lower extremity edema, orthopnea, or PND.  He struggles to breathe behind the mask but his breathing is otherwise stable.  He had his first Covid 19 vaccine last week.   Past Medical History:  Diagnosis Date  . Chronic kidney disease   . Coronary artery disease   . Gout   . Hyperlipidemia   . Hypertension   . Type 2 diabetes mellitus ([place])     Past Surgical History:  Procedure Laterality Date  . CARDIAC CATHETERIZATION  01/16/2004   multi-vessel  coronary disease 40-50% Left main, 100% mid LAD75-80% ostial stenosis  . CARDIOVASCULAR STRESS TEST  08/09/2004   post CABG, low risk scan  . CARDIOVASCULAR STRESS TEST  10/23/2003   mild septal ischemia, EF43%,   . CORONARY ARTERY BYPASS GRAFT  02/05/2004   CABGx3 JoseBartle  . Lower Ext. Doppler  10/20/2011   Right Anterior Tibial: occlusive disease, Left Anterior Tibial occluded w/reconstitution at ankle, mildly abnormal LEA  . NM MYOCAR PERF WALL MOTION  06/07/2011   protocol:Persantine, EF60%, negative for ischemia, low risk scan  . NM MYOCAR PERF WALL MOTION  08/05/2009   protocol: Persantine, EF57%. no evidence of ischemia, low risk scan.   . TRANSPERINEAL IMPLANT OF RADIATION SEEDS W/ ULTRASOUND       Current Outpatient Medications  Medication Sig Dispense Refill  . allopurinol (ZYLOPRIM) 100 MG tablet TAKE 1/2 TABLET BY MOUTH EVERY DAY 45 tablet 1  . aspirin 81 MG tablet Take 81 mg by mouth daily.    . cholecalciferol (VITAMIN D) 400 UNITS TABS tablet Take 5,000 Units by mouth.    . clotrimazole-betamethasone (LOTRISONE) cream Apply to affected area 2 times daily 15 g 1  . Diclofenac Sodium 3 % GEL Place onto the skin.    . Flaxseed, Linseed, (FLAX SEEDS PO) Take 1 tablet by mouth daily.    [name]  ketoconazole (NIZORAL) 2 % cream Apply 1 application topically daily.    [name] lidocaine (LIDODERM) 5 % Place 1 patch onto the skin daily. Remove & Discard patch within 12 hours or as directed by MD Place over left lower back. 30 patch 0  . Linaclotide (LINZESS) 290 MCG CAPS capsule Take 290 mcg by mouth daily.    . metoprolol succinate (TOPROL-XL) 50 MG 24 hr tablet TAKE 1 TABLET BY MOUTH ONCE DAILY 30 tablet 5  . rosuvastatin (CRESTOR) 10 MG tablet TAKE 1 TABLET BY MOUTH EVERY DAY 90 tablet 1  . Semaglutide,0.25 or 0.5MG /DOS, (OZEMPIC, 0.25 OR 0.5 MG/DOSE,) 2 MG/1.5ML SOPN Inject 0.25 mg into the skin once a week. 3 pen 3  . sildenafil (VIAGRA) 50 MG tablet Take 1 tablet (50 mg total) by  mouth daily as needed for erectile dysfunction. 30 tablet 0  . telmisartan (MICARDIS) 20 MG tablet TAKE 1 TABLET BY MOUTH EVERY DAY 90 tablet 0   No current facility-administered medications for this visit.    Allergies:   Patient has no known allergies.    Social History:  The patient  reports that he quit smoking about 35 years ago. His smoking use included cigarettes. He has a 5.00 pack-year smoking history. He has never used smokeless tobacco. He reports previous alcohol use. He reports previous drug use.   Family History:  The patient's family history includes Asthma in his mother; Ponce (age of onset: 70) in his sister; Diabetes in his brother; Heart attack (age of onset: 57) in his father; Heart disease in his father and maternal grandmother; Stroke in his father.    ROS:  Please see the history of present illness.   Otherwise, review of systems are positive for none.   All other systems are reviewed and negative.    PHYSICAL EXAM: VS:  BP 131/76   Pulse 69   Temp (!) 97.3 F (36.3 C)   Ht 5\' 7"  (1.702 m)   Wt 173 lb (78.5 kg)   SpO2 98%   BMI 27.10 kg/m  , BMI Body mass index is 27.1 kg/m. GENERAL:  Well appearing HEENT: Pupils equal round and reactive, fundi not visualized, oral mucosa unremarkable NECK:  No jugular venous distention, waveform within normal limits, carotid upstroke brisk and symmetric, no bruits LUNGS:  Clear to auscultation bilaterally HEART:  RRR.  PMI not displaced or sustained,S1 and S2 within normal limits, no S3, no S4, no clicks, no rubs, no murmurs ABD:  Flat, positive bowel sounds normal in frequency in pitch, no bruits, no rebound, no guarding, no midline pulsatile mass, no hepatomegaly, no splenomegaly EXT:  2 plus pulses throughout, no edema, no cyanosis no clubbing SKIN:  No rashes no nodules NEURO:  Cranial nerves II through XII grossly intact, motor grossly intact throughout PSYCH:  Cognitively intact, oriented to person place and  time   EKG:  EKG is ordered today. The ekg ordered 11/23/17 demonstrates sinus bradycardia.  Rate 52 bpm.  RBBB.  First degree AV block.   08/02/2018: Sinus bradycardia.  Rate 56 bpm.  Right bundle branch block.  LAFB.   Recent Labs: 03/14/2019: ALT 20 09/24/2019: BUN 18; Creatinine, Ser 1.54; Potassium 4.6; Sodium 140 12/04/2019: Hemoglobin 15.0; Platelets 219   01/17/2018: Total cholesterol 131, triglycerides 95, HDL 45, LDL 67 10/17/17 Hemoglobin A1c 7.8% Sodium 140, potassium 4.6, BUN 22, creatinine 1.59 AST 24, ALT 29  05/12/17: Total cholesterol 116, triglycerides 57, HDL 42, LDL 63  Lipid Panel  Component Value Date/Time   CHOL 122 12/04/2019 1700   TRIG 62 12/04/2019 1700   HDL 43 12/04/2019 1700   CHOLHDL 2.8 12/04/2019 1700   LDLCALC 66 12/04/2019 1700      Wt Readings from Last 3 Encounters:  12/13/19 173 lb (78.5 kg)  12/04/19 170 lb (77.1 kg)  12/04/19 170 lb (77.1 kg)      ASSESSMENT AND PLAN:  # CAD s/p CABG: Mr. Vanwhy is doing well.  He is stable and has no angina.  Continue aspirin, rosuvastatin, and metoprolol.  # Hypertension: Blood pressure very mildly above goal.  He should be less than 130/80.  He is going to work on increasing his exercise.  Continue metoprolol and telmisartan.  # Hyperlipidemia: Lipids well-controlled.  Continue rosuvastatin.  LDL was 66 on 11/2019.   Current medicines are reviewed at length with the patient today.  The patient does not have concerns regarding medicines.  The following changes have been made:  no change  Labs/ tests ordered today include:  No orders of the defined types were placed in this encounter.    Disposition:   FU with Brandonlee Navis C. Oval Linsey, MD, [facility] in 1 year.     Signed, Stanislaw Acton C. Oval Linsey, MD, [facility]  12/13/2019 10:46 AM    [place]

## 2019-12-13 NOTE — Patient Outreach (Signed)
[facility]) Care Management  12/13/2019  Jose Ponce 09/06/1941 [id number]    Follow up call placed to [employer] regarding patient assistance application(s) for Ozempic , Olivia Mackie confirms patient has been approved as of 1/22 until 11/20/2020. Medication to arrive at providers office in the next 10-14 business days.  Follow up:  Will remove myself from care team and  inform Rulo and Elmore Guise @ [facility].  Maud Deed Chana Bode [place] Certified Pharmacy Technician [facility] Direct [phone]

## 2019-12-16 ENCOUNTER — Telehealth: Payer: Self-pay

## 2019-12-16 DIAGNOSIS — Z20828 Contact with and (suspected) exposure to other viral communicable diseases: Secondary | ICD-10-CM | POA: Diagnosis not present

## 2019-12-16 NOTE — Telephone Encounter (Signed)
CALLED TO CHECK ON PT SINCE LEARNING THAT HE HAD COME IN CONTACT WITH SOMEONE WITH COVID. PT IS FINE WITH NO SYMPTOMS. PT WAS TESTED TODAY AT [facility]. RESULTS PENDING. PT WAS ADVISED NOT TO COME TO THE OFFICE ON Wednesday.

## 2019-12-17 DIAGNOSIS — C61 Malignant neoplasm of prostate: Secondary | ICD-10-CM | POA: Diagnosis not present

## 2019-12-17 DIAGNOSIS — N5201 Erectile dysfunction due to arterial insufficiency: Secondary | ICD-10-CM | POA: Diagnosis not present

## 2019-12-18 ENCOUNTER — Telehealth: Payer: Self-pay

## 2019-12-18 ENCOUNTER — Ambulatory Visit (INDEPENDENT_AMBULATORY_CARE_PROVIDER_SITE_OTHER): Payer: Medicare PPO | Admitting: Pharmacist

## 2019-12-18 DIAGNOSIS — N183 Chronic kidney disease, stage 3 unspecified: Secondary | ICD-10-CM | POA: Diagnosis not present

## 2019-12-18 DIAGNOSIS — E1122 Type 2 diabetes mellitus with diabetic chronic kidney disease: Secondary | ICD-10-CM

## 2019-12-18 NOTE — Telephone Encounter (Signed)
The pt was told that he could come for his injection today and that I have to keep him on the first hall.

## 2019-12-18 NOTE — Telephone Encounter (Signed)
The pt said that he got his covid results today and that he is negative.  The pt said he had it at [facility] on [address].  The pt said he has the results and wants to know if he can come today for his Ozempic injection.

## 2019-12-18 NOTE — Patient Instructions (Signed)
Visit Information  Goals Addressed            This Visit's Progress     Patient Stated   . I would like to manage my diabetes (pt-stated)       Current Barriers:  . Financial Barriers . Unable to self-inject Ozempic  Pharmacist Clinical Goal(s):  [name] Over the next 90 days, patient will demonstrate Improved medication adherence as evidenced by attempting to self inject Ozempic goal re-established on 10/23/19 for 2021 goals of care . Patient picked up 5 boxes of Ozempic 0.5mg  weekly.  Refill to be faxed in April 2021 (complete)  Interventions: Face to face visit on 12/18/19 . Comprehensive medication review performed. . Discussed plans with patient for ongoing care management follow up and provided patient with direct contact information for care management team . Patient continues to tolerate Ozempic 0.25mg  weekly without adverse events.  He denies adverse events of hypoglycemia.  He is unable to inject Ozempic (patient fearful of self-injecting) and comes into the office to have Ozempic injection every Wednesday.   . Patient has been approved for Ozempic patient assistance until 11/20/20. Will reapply with patient in January 2021.  [name] Last A1c increased to 8% o Counseled extensively on diet.  Patient does not wish to add another medicine or increase Ozempic dose.  He states he is doing better eating/walking.  MD would like to add insulin if he continues to increase o He reports most FBG in the 150-180, denies hypoglycemia. We discussed meals.  Patient reports he is doing better with his diet and has cut carbs since we spoke last week.  He is avoiding sugary drinks.  Encouraged patient to adhere to diabetes-healthy diet and exercise as able and as recommended by PCP.  Patient verbalizes understanding. o Will continue current dose of Ozempic 0.25mg  weekly.  If patient reverts back to carb heavy diet and Bgs increase, recommend to increase OZEMPIC to 0.5mg  sq weekly.  Patient verbalizes  understanding.   . Diabetic patient on aspirin/statin for prevention as appropriate.  Fill history reviewed.  Rosuvastatin 10mg  #90 DS last filled on 12/13/19.   LDL on 06/18/19 was 63.  Patient is ARB (telmisartan) as prescribed.  BP at goal <130.80 . Will follow up with patient next week  Patient Self Care Activities:  . Currently UNABLE TO independently self inject Ozempic at this time . Attends all scheduled provider appointments . Calls pharmacy for medication refills  Please see past updates related to this goal by clicking on the "Past Updates" button in the selected goal         The patient verbalized understanding of instructions provided today and declined a print copy of patient instruction materials.   The care management team will reach out to the patient again over the next 7 days.   SIGNATURE Regina Eck, PharmD, BCPS Clinical Pharmacist, [facility] [place]: [phone]

## 2019-12-18 NOTE — Telephone Encounter (Signed)
ok 

## 2019-12-18 NOTE — Progress Notes (Signed)
Chronic Care Management    Visit Note  12/18/2019 Name: Jose Ponce MRN: [id number] DOB: 1941/08/18  Referred by: Glendale Chard, MD Reason for referral : Chronic Care Management   JENSEN NEVIUS is a 79 y.o. year old male who is a primary care patient of Glendale Chard, MD. The CCM team was consulted for assistance with chronic disease management and care coordination needs related to DMII  Review of patient status, including review of consultants reports, relevant laboratory and other test results, and collaboration with appropriate care team members and the patient's provider was performed as part of comprehensive patient evaluation and provision of chronic care management services.    Face to face visit with patient in clinic today   Medications: Outpatient Encounter Medications as of 12/18/2019  Medication Sig  . allopurinol (ZYLOPRIM) 100 MG tablet TAKE 1/2 TABLET BY MOUTH EVERY DAY  . aspirin 81 MG tablet Take 81 mg by mouth daily.  . cholecalciferol (VITAMIN D) 400 UNITS TABS tablet Take 5,000 Units by mouth.  . clotrimazole-betamethasone (LOTRISONE) cream Apply to affected area 2 times daily  . Diclofenac Sodium 3 % GEL Place onto the skin.  . Flaxseed, Linseed, (FLAX SEEDS PO) Take 1 tablet by mouth daily.  [name] ketoconazole (NIZORAL) 2 % cream Apply 1 application topically daily.  [name] lidocaine (LIDODERM) 5 % Place 1 patch onto the skin daily. Remove & Discard patch within 12 hours or as directed by MD Place over left lower back.  . Linaclotide (LINZESS) 290 MCG CAPS capsule Take 290 mcg by mouth daily.  . metoprolol succinate (TOPROL-XL) 50 MG 24 hr tablet TAKE 1 TABLET BY MOUTH ONCE DAILY  . rosuvastatin (CRESTOR) 10 MG tablet TAKE 1 TABLET BY MOUTH EVERY DAY  . Semaglutide,0.25 or 0.5MG /DOS, (OZEMPIC, 0.25 OR 0.5 MG/DOSE,) 2 MG/1.5ML SOPN Inject 0.25 mg into the skin once a week.  . sildenafil (VIAGRA) 50 MG tablet Take 1 tablet (50 mg total) by mouth daily as needed for  erectile dysfunction.  [name] telmisartan (MICARDIS) 20 MG tablet TAKE 1 TABLET BY MOUTH EVERY DAY   No facility-administered encounter medications on file as of 12/18/2019.     Objective:   Goals Addressed            This Visit's Progress     Patient Stated   . I would like to manage my diabetes (pt-stated)       Current Barriers:  . Financial Barriers . Unable to self-inject Ozempic  Pharmacist Clinical Goal(s):  [name] Over the next 90 days, patient will demonstrate Improved medication adherence as evidenced by attempting to self inject Ozempic goal re-established on 10/23/19 for 2021 goals of care . Patient picked up 5 boxes of Ozempic 0.5mg  weekly.  Refill to be faxed in April 2021 (complete)  Interventions: Face to face visit on 12/18/19 . Comprehensive medication review performed. . Discussed plans with patient for ongoing care management follow up and provided patient with direct contact information for care management team . Patient continues to tolerate Ozempic 0.25mg  weekly without adverse events.  He denies adverse events of hypoglycemia.  He is unable to inject Ozempic (patient fearful of self-injecting) and comes into the office to have Ozempic injection every Wednesday.   . Patient has been approved for Ozempic patient assistance until 11/20/20. Will reapply with patient in January 2021.  [name] Last A1c increased to 8% o Counseled extensively on diet.  Patient does not wish to add another medicine or increase Ozempic dose.  He states he is doing better eating/walking.  MD would like to add insulin if he continues to increase o He reports most FBG in the 150-180, denies hypoglycemia. We discussed meals.  Patient reports he is doing better with his diet and has cut carbs since we spoke last week.  He is avoiding sugary drinks.  Encouraged patient to adhere to diabetes-healthy diet and exercise as able and as recommended by PCP.  Patient verbalizes understanding. o Will continue current  dose of Ozempic 0.25mg  weekly.  If patient reverts back to carb heavy diet and Bgs increase, recommend to increase OZEMPIC to 0.5mg  sq weekly.  Patient verbalizes understanding.   . Diabetic patient on aspirin/statin for prevention as appropriate.  Fill history reviewed.  Rosuvastatin 10mg  #90 DS last filled on 12/13/19.   LDL on 06/18/19 was 63.  Patient is ARB (telmisartan) as prescribed.  BP at goal <130.80 . Will follow up with patient next week  Patient Self Care Activities:  . Currently UNABLE TO independently self inject Ozempic at this time . Attends all scheduled provider appointments . Calls pharmacy for medication refills  Please see past updates related to this goal by clicking on the "Past Updates" button in the selected goal           Plan:   The care management team will reach out to the patient again over the next 7 days.   Provider Signature  Regina Eck, PharmD, BCPS Clinical Pharmacist, [facility] [place]: [phone]  .

## 2019-12-19 ENCOUNTER — Telehealth: Payer: Self-pay

## 2019-12-19 DIAGNOSIS — E119 Type 2 diabetes mellitus without complications: Secondary | ICD-10-CM | POA: Diagnosis not present

## 2019-12-25 ENCOUNTER — Telehealth: Payer: Self-pay

## 2020-01-15 ENCOUNTER — Telehealth: Payer: Self-pay

## 2020-01-29 ENCOUNTER — Ambulatory Visit (INDEPENDENT_AMBULATORY_CARE_PROVIDER_SITE_OTHER): Payer: Medicare PPO | Admitting: Pharmacist

## 2020-01-29 ENCOUNTER — Encounter: Payer: Self-pay | Admitting: Internal Medicine

## 2020-01-29 DIAGNOSIS — E1122 Type 2 diabetes mellitus with diabetic chronic kidney disease: Secondary | ICD-10-CM | POA: Diagnosis not present

## 2020-01-29 DIAGNOSIS — N183 Chronic kidney disease, stage 3 unspecified: Secondary | ICD-10-CM | POA: Diagnosis not present

## 2020-01-30 NOTE — Patient Instructions (Signed)
Visit Information  Goals Addressed            This Visit's Progress     Patient Stated   . I would like to manage my diabetes (pt-stated)       Current Barriers:  . Financial Barriers . Unable to self-inject Ozempic (comes to clinic every Wednesdays for Ozempic injection)  Pharmacist Clinical Goal(s):  [name] Over the next 90 days, patient will demonstrate Improved medication adherence as evidenced by attempting to self inject Ozempic goal re-established on 10/23/19 for 2021 goals of care . Patient picked up 5 boxes of Ozempic.  Refill to be faxed in April 2021 (complete)  Interventions: Met with patient in clinic  on 01/29/20 . Comprehensive medication review performed. . Discussed plans with patient for ongoing care management follow up and provided patient with direct contact information for care management team . Patient continues to tolerate Ozempic 0.87m weekly without adverse events.  He has not previously tolerated Ozempic 0.522mweekly.  He denies adverse events of hypoglycemia.  He is unable to inject Ozempic (patient fearful of self-injecting) and comes into the office to have Ozempic injection every Wednesday.   . Patient has been approved for Ozempic 0.2593meekly via [employer] assistance program until 11/20/20.  A 4-m75-monthply was shipped to PCP office.  A refill request form must be faxed to [employer] refills. Last refill MUST be ordered by 10/20/20. . Last A1c increased to 8.1% o Counseled extensively on diet.  Patient does not wish to add another medicine nor increase Ozempic dose.  He states he is doing better eating/walking.   o MD would like to add insulin if Bgs continue to increase o He reports most FBG in the 150-180, denies hypoglycemia. We discussed meals.  Patient reports he is doing better with his diet and has cut carbs since we spoke last month.  He is avoiding sugary drinks.  Encouraged patient to adhere to diabetes-healthy diet and exercise as  able and as recommended by PCP.  Patient verbalizes understanding. o Will continue current dose of Ozempic 0.25mg37mkly.  If patient reverts back to carb heavy diet and Bgs increase, recommend to increase OZEMPIC to 0.5mg s14meekly.  Patient verbalizes understanding.   o Consider SGLT2 if not amenable to ozempic 0.5mg we17my . T2DM patient on aspirin/statin for prevention as appropriate.  Fill history reviewed.  Rosuvastatin 10mg #922m last filled on 12/13/19.   LDL on 1/13/2021as 66.  Patient is ARB (telmisartan) as prescribed.  BP at goal <130.80 . Will follow up with patient in 1 month  Patient Self Care Activities:  . Currently UNABLE TO independently self inject Ozempic at this time . Attends all scheduled provider appointments . Calls pharmacy for medication refills  Please see past updates related to this goal by clicking on the "Past Updates" button in the selected goal         The patient verbalized understanding of instructions provided today and declined a print copy of patient instruction materials.   The care management team will reach out to the patient again over the next 30 days.   SIGNATURE Faaris Arizpe DaRegina Eck, BCPS Clinical Pharmacist, [facility] [phone]

## 2020-01-30 NOTE — Progress Notes (Signed)
Chronic Care Management   Visit Note  01/29/2020 Name: Jose Jose MRN: [id number] DOB: 09-11-1941  Referred by: Glendale Chard, MD Reason for referral : Chronic Care Management and Diabetes   Jose Jose is a 79 y.o. year old male who is a primary care patient of Glendale Chard, MD. The CCM team was consulted for assistance with chronic disease management and care coordination needs related to HLD and DMII  Review of patient status, including review of consultants reports, relevant laboratory and other test results, and collaboration with appropriate care team members and the patient's provider was performed as part of comprehensive patient evaluation and provision of chronic care management services.    SDOH (Social Determinants of Health) assessments performed: Yes See Care Plan activities for detailed interventions related to SDOH)     Medications: Outpatient Encounter Medications as of 01/29/2020  Medication Sig  . allopurinol (ZYLOPRIM) 100 MG tablet TAKE 1/2 TABLET BY MOUTH EVERY DAY  . aspirin 81 MG tablet Take 81 mg by mouth daily.  . cholecalciferol (VITAMIN D) 400 UNITS TABS tablet Take 5,000 Units by mouth.  . clotrimazole-betamethasone (LOTRISONE) cream Apply to affected area 2 times daily  . Diclofenac Sodium 3 % GEL Place onto the skin.  . Flaxseed, Linseed, (FLAX SEEDS PO) Take 1 tablet by mouth daily.  [name] ketoconazole (NIZORAL) 2 % cream Apply 1 application topically daily.  [name] lidocaine (LIDODERM) 5 % Place 1 patch onto the skin daily. Remove & Discard patch within 12 hours or as directed by MD Place over left lower back.  . Linaclotide (LINZESS) 290 MCG CAPS capsule Take 290 mcg by mouth daily.  . metoprolol succinate (TOPROL-XL) 50 MG 24 hr tablet TAKE 1 TABLET BY MOUTH ONCE DAILY  . rosuvastatin (CRESTOR) 10 MG tablet TAKE 1 TABLET BY MOUTH EVERY DAY  . Semaglutide,0.25 or 0.'5MG'$ /DOS, (OZEMPIC, 0.25 OR 0.5 MG/DOSE,) 2 MG/1.5ML SOPN Inject 0.25 mg into the skin  once a week.  . sildenafil (VIAGRA) 50 MG tablet Take 1 tablet (50 mg total) by mouth daily as needed for erectile dysfunction.  [name] telmisartan (MICARDIS) 20 MG tablet TAKE 1 TABLET BY MOUTH EVERY DAY   No facility-administered encounter medications on file as of 01/29/2020.     Objective:   Goals Addressed            This Visit's Progress     Patient Stated   . I would like to manage my diabetes (pt-stated)       Current Barriers:  . Financial Barriers . Unable to self-inject Ozempic (comes to clinic every Wednesdays for Ozempic injection)  Pharmacist Clinical Goal(s):  [name] Over the next 90 days, patient will demonstrate Improved medication adherence as evidenced by attempting to self inject Ozempic goal re-established on 10/23/19 for 2021 goals of care . Patient picked up 5 boxes of Ozempic.  Refill to be faxed in April 2021 (complete)  Interventions: Met with patient in clinic  on 01/29/20 . Comprehensive medication review performed. . Discussed plans with patient for ongoing care management follow up and provided patient with direct contact information for care management team . Patient continues to tolerate Ozempic 0.'25mg'$  weekly without adverse events.  He has not previously tolerated Ozempic 0.'5mg'$  weekly.  He denies adverse events of hypoglycemia.  He is unable to inject Ozempic (patient fearful of self-injecting) and comes into the office to have Ozempic injection every Wednesday.   . Patient has been approved for Ozempic 0.'25mg'$  weekly via [employer] patient  assistance program until 11/20/20.  A 59-monthsupply was shipped to PCP office.  A refill request [facility]. Last refill MUST be ordered by 10/20/20. . Last A1c increased to 8.1% o Counseled extensively on diet.  Patient does not wish to add another medicine nor increase Ozempic dose.  He states he is doing better eating/walking.   o MD would like to add insulin if Bgs continue to  increase o He reports most FBG in the 150-180, denies hypoglycemia. We discussed meals.  Patient reports he is doing better with his diet and has cut carbs since we spoke last month.  He is avoiding sugary drinks.  Encouraged patient to adhere to diabetes-healthy diet and exercise as able and as recommended by PCP.  Patient verbalizes understanding. o Will continue current dose of Ozempic 0.'25mg'$  weekly.  If patient reverts back to carb heavy diet and Bgs increase, recommend to increase OZEMPIC to 0.'5mg'$  sq weekly.  Patient verbalizes understanding.   o Consider SGLT2 if not amenable to ozempic 0.'5mg'$  weekly . T2DM patient on aspirin/statin for prevention as appropriate.  Fill history reviewed.  Rosuvastatin '10mg'$  #90 DS last filled on 12/13/19.   LDL on 1/13/2021as 66.  Patient is ARB (telmisartan) as prescribed.  BP at goal <130.80 . Will follow up with patient in 1 month  Patient Self Care Activities:  . Currently UNABLE TO independently self inject Ozempic at this time . Attends all scheduled provider appointments . Calls pharmacy for medication refills  Please see past updates related to this goal by clicking on the "Past Updates" button in the selected goal           Plan:   The care management team will reach out to the patient again over the next 30 days.   Provider Signature JRegina Eck PharmD, BCPS Clinical Pharmacist, [facility] [place] [place] [phone]

## 2020-02-07 ENCOUNTER — Telehealth: Payer: Self-pay

## 2020-02-19 ENCOUNTER — Telehealth: Payer: Self-pay

## 2020-03-02 ENCOUNTER — Other Ambulatory Visit: Payer: Self-pay | Admitting: Internal Medicine

## 2020-04-01 ENCOUNTER — Encounter: Payer: Self-pay | Admitting: Internal Medicine

## 2020-04-01 ENCOUNTER — Other Ambulatory Visit: Payer: Self-pay

## 2020-04-01 ENCOUNTER — Ambulatory Visit (INDEPENDENT_AMBULATORY_CARE_PROVIDER_SITE_OTHER): Payer: Medicare PPO | Admitting: Internal Medicine

## 2020-04-01 VITALS — BP 114/72 | HR 76 | Temp 97.7°F | Ht 67.0 in | Wt 168.8 lb

## 2020-04-01 DIAGNOSIS — Z6826 Body mass index (BMI) 26.0-26.9, adult: Secondary | ICD-10-CM

## 2020-04-01 DIAGNOSIS — E663 Overweight: Secondary | ICD-10-CM

## 2020-04-01 DIAGNOSIS — R21 Rash and other nonspecific skin eruption: Secondary | ICD-10-CM

## 2020-04-01 DIAGNOSIS — E1122 Type 2 diabetes mellitus with diabetic chronic kidney disease: Secondary | ICD-10-CM

## 2020-04-01 DIAGNOSIS — I1310 Hypertensive heart and chronic kidney disease without heart failure, with stage 1 through stage 4 chronic kidney disease, or unspecified chronic kidney disease: Secondary | ICD-10-CM | POA: Diagnosis not present

## 2020-04-01 DIAGNOSIS — N183 Chronic kidney disease, stage 3 unspecified: Secondary | ICD-10-CM

## 2020-04-01 NOTE — Patient Instructions (Signed)

## 2020-04-01 NOTE — Progress Notes (Signed)
This visit occurred during the SARS-CoV-2 public health emergency.  Safety protocols were in place, including screening questions prior to the visit, additional usage of staff PPE, and extensive cleaning of exam room while observing appropriate contact time as indicated for disinfecting solutions.  Subjective:     Patient ID: Jose Ponce , male    DOB: January 20, 1941 , 79 y.o.   MRN: [id number]   Chief Complaint  Patient presents with  . Diabetes  . Rash    HPI  He reports today for dm f/u. He reports compliance with meds. He states his sugars have improved.   Diabetes He presents for his follow-up diabetic visit. He has type 2 diabetes mellitus. There are no hypoglycemic associated symptoms. There are no diabetic associated symptoms. There are no hypoglycemic complications. He does not see a podiatrist.Eye exam is current.  Rash The current episode started more than 1 month ago. The problem is unchanged. The rash is characterized by peeling, redness and itchiness. He was exposed to nothing. Pertinent negatives include no congestion, cough, fever, shortness of breath or sore throat.     Past Medical History:  Diagnosis Date  . Chronic kidney disease   . Coronary artery disease   . Gout   . Hyperlipidemia   . Hypertension   . Type 2 diabetes mellitus (HCC)      Family History  Problem Relation Age of Onset  . Asthma Mother   . Heart disease Father   . Stroke Father   . Heart attack Father 44  . Cancer Sister 62  . Diabetes Brother   . Heart disease Maternal Grandmother      Current Outpatient Medications:  .  allopurinol (ZYLOPRIM) 100 MG tablet, TAKE 1/2 TABLET BY MOUTH EVERY DAY, Disp: 45 tablet, Rfl: 1 .  aspirin 81 MG tablet, Take 81 mg by mouth daily., Disp: , Rfl:  .  cholecalciferol (VITAMIN D) 400 UNITS TABS tablet, Take 5,000 Units by mouth., Disp: , Rfl:  .  clotrimazole-betamethasone (LOTRISONE) cream, Apply to affected area 2 times daily, Disp: 15 g, Rfl:  1 .  Diclofenac Sodium 3 % GEL, Place onto the skin., Disp: , Rfl:  .  Flaxseed, Linseed, (FLAX SEEDS PO), Take 1 tablet by mouth daily., Disp: , Rfl:  .  ketoconazole (NIZORAL) 2 % cream, Apply 1 application topically daily., Disp: , Rfl:  .  lidocaine (LIDODERM) 5 %, Place 1 patch onto the skin daily. Remove & Discard patch within 12 hours or as directed by MD Place over left lower back., Disp: 30 patch, Rfl: 0 .  Linaclotide (LINZESS) 290 MCG CAPS capsule, Take 290 mcg by mouth daily., Disp: , Rfl:  .  metoprolol succinate (TOPROL-XL) 50 MG 24 hr tablet, TAKE 1 TABLET BY MOUTH ONCE DAILY, Disp: 30 tablet, Rfl: 5 .  rosuvastatin (CRESTOR) 10 MG tablet, TAKE 1 TABLET BY MOUTH EVERY DAY, Disp: 90 tablet, Rfl: 1 .  Semaglutide,0.25 or 0.'5MG'$ /DOS, (OZEMPIC, 0.25 OR 0.5 MG/DOSE,) 2 MG/1.5ML SOPN, Inject 0.25 mg into the skin once a week., Disp: 3 pen, Rfl: 3 .  sildenafil (VIAGRA) 50 MG tablet, Take 1 tablet (50 mg total) by mouth daily as needed for erectile dysfunction., Disp: 30 tablet, Rfl: 0 .  telmisartan (MICARDIS) 20 MG tablet, TAKE 1 TABLET BY MOUTH EVERY DAY, Disp: 90 tablet, Rfl: 0   No Known Allergies   Review of Systems  Constitutional: Negative.  Negative for fever.  HENT: Negative for congestion and sore throat.  Respiratory: Negative.  Negative for cough and shortness of breath.   Cardiovascular: Negative.   Gastrointestinal: Negative.   Skin: Positive for rash.  Neurological: Negative.   Psychiatric/Behavioral: Negative.      Today's Vitals   04/01/20 1147  BP: 114/72  Pulse: 76  Temp: 97.7 F (36.5 C)  TempSrc: Oral  Weight: 168 lb 12.8 oz (76.6 kg)  Height: '5\' 7"'$  (1.702 m)  PainSc: 0-No pain   Body mass index is 26.44 kg/m.   Objective:  Physical Exam Vitals and nursing note reviewed.  Constitutional:      Appearance: Normal appearance.  Cardiovascular:     Rate and Rhythm: Normal rate and regular rhythm.     Heart sounds: Normal heart sounds.   Pulmonary:     Effort: Pulmonary effort is normal.     Breath sounds: Normal breath sounds.  Skin:    General: Skin is warm.     Comments: Erythematous tender area in palm of right hand. No vesicular lesions noted. This is surrounded by dry, scaly, flesh-colored skin.   Neurological:     General: No focal deficit present.     Mental Status: He is alert.  Psychiatric:        Mood and Affect: Mood normal.         Assessment And Plan:     1. Diabetes mellitus with stage 3 chronic kidney disease (HCC)  Chronic, improved with use of GLP-1. He agrees to Diabetes refresher course. I will refer him to Nutrition for further dietary counseling. I will check labs as listed below. He is reminded that exercise  Is a vital component of diabetes management. He is encouraged to incorporate at least 150 minutes of moderate exercise per week.   - CMP14+EGFR - Hemoglobin A1c - Referral to Nutrition and Diabetes Services  2. Rash  He is advised to apply neosporin to middle part of rash on palmar side of right hand, and to use lotrisone cream on outer, scaly rash twice daily as needed.   3. Benign hypertensive heart and renal disease  Chronic, well controlled. He will continue with current meds. He is encouraged to avoid adding salt to his foods.   4. Overweight with body mass index (BMI) of 26 to 26.9 in adult  His weight is stable for his demographic. He is encouraged to strive for 150 minutes of moderate exercise per week.    [name], MD    THE PATIENT IS ENCOURAGED TO PRACTICE SOCIAL DISTANCING DUE TO THE COVID-19 PANDEMIC.

## 2020-04-15 ENCOUNTER — Other Ambulatory Visit: Payer: Self-pay | Admitting: Internal Medicine

## 2020-04-15 DIAGNOSIS — E1122 Type 2 diabetes mellitus with diabetic chronic kidney disease: Secondary | ICD-10-CM | POA: Diagnosis not present

## 2020-04-15 DIAGNOSIS — N183 Chronic kidney disease, stage 3 unspecified: Secondary | ICD-10-CM | POA: Diagnosis not present

## 2020-04-16 LAB — CMP14+EGFR
ALT: 16 IU/L (ref 0–44)
AST: 18 IU/L (ref 0–40)
Albumin/Globulin Ratio: 1.4 (ref 1.2–2.2)
Albumin: 4.2 g/dL (ref 3.7–4.7)
Alkaline Phosphatase: 68 IU/L (ref 48–121)
BUN/Creatinine Ratio: 16 (ref 10–24)
BUN: 26 mg/dL (ref 8–27)
Bilirubin Total: 0.6 mg/dL (ref 0.0–1.2)
CO2: 24 mmol/L (ref 20–29)
Calcium: 9.8 mg/dL (ref 8.6–10.2)
Chloride: 104 mmol/L (ref 96–106)
Creatinine, Ser: 1.59 mg/dL — ABNORMAL HIGH (ref 0.76–1.27)
GFR calc Af Amer: 47 mL/min/{1.73_m2} — ABNORMAL LOW (ref >59)
GFR calc non Af Amer: 41 mL/min/{1.73_m2} — ABNORMAL LOW (ref >59)
Globulin, Total: 3 g/dL (ref 1.5–4.5)
Glucose: 153 mg/dL — ABNORMAL HIGH (ref 65–99)
Potassium: 4.9 mmol/L (ref 3.5–5.2)
Sodium: 140 mmol/L (ref 134–144)
Total Protein: 7.2 g/dL (ref 6.0–8.5)

## 2020-04-16 LAB — HEMOGLOBIN A1C
Est. average glucose Bld gHb Est-mCnc: 180 mg/dL
Hgb A1c MFr Bld: 7.9 % — ABNORMAL HIGH (ref 4.8–5.6)

## 2020-04-22 ENCOUNTER — Ambulatory Visit: Payer: Self-pay

## 2020-04-22 ENCOUNTER — Telehealth: Payer: Self-pay

## 2020-04-22 ENCOUNTER — Other Ambulatory Visit: Payer: Self-pay

## 2020-04-22 DIAGNOSIS — E785 Hyperlipidemia, unspecified: Secondary | ICD-10-CM

## 2020-04-22 DIAGNOSIS — N183 Chronic kidney disease, stage 3 unspecified: Secondary | ICD-10-CM

## 2020-04-22 DIAGNOSIS — I1310 Hypertensive heart and chronic kidney disease without heart failure, with stage 1 through stage 4 chronic kidney disease, or unspecified chronic kidney disease: Secondary | ICD-10-CM

## 2020-04-23 NOTE — Chronic Care Management (AMB) (Signed)
  Chronic Care Management   Follow Up Note   04/22/2020 Name: Jose Ponce MRN: [id number] DOB: 05-17-1941  Referred by: Glendale Chard, MD Reason for referral : Chronic Care Management (FU RN CM Call )   Jose Ponce is a 79 y.o. year old male who is a primary care patient of Glendale Chard, MD. The CCM team was consulted for assistance with chronic disease management and care coordination needs.    Review of patient status, including review of consultants reports, relevant laboratory and other test results, and collaboration with appropriate care team members and the patient's provider was performed as part of comprehensive patient evaluation and provision of chronic care management services.    Reviewed chart in preparation to contact patient.   Outpatient Encounter Medications as of 04/22/2020  Medication Sig  . allopurinol (ZYLOPRIM) 100 MG tablet TAKE 1/2 TABLET BY MOUTH EVERY DAY  . aspirin 81 MG tablet Take 81 mg by mouth daily.  . cholecalciferol (VITAMIN D) 400 UNITS TABS tablet Take 5,000 Units by mouth.  . clotrimazole-betamethasone (LOTRISONE) cream Apply to affected area 2 times daily  . Diclofenac Sodium 3 % GEL Place onto the skin.  . Flaxseed, Linseed, (FLAX SEEDS PO) Take 1 tablet by mouth daily.  [name] ketoconazole (NIZORAL) 2 % cream Apply 1 application topically daily.  [name] lidocaine (LIDODERM) 5 % Place 1 patch onto the skin daily. Remove & Discard patch within 12 hours or as directed by MD Place over left lower back.  . Linaclotide (LINZESS) 290 MCG CAPS capsule Take 290 mcg by mouth daily.  . metoprolol succinate (TOPROL-XL) 50 MG 24 hr tablet TAKE 1 TABLET BY MOUTH ONCE DAILY  . rosuvastatin (CRESTOR) 10 MG tablet TAKE 1 TABLET BY MOUTH EVERY DAY  . Semaglutide,0.25 or 0.5MG /DOS, (OZEMPIC, 0.25 OR 0.5 MG/DOSE,) 2 MG/1.5ML SOPN Inject 0.25 mg into the skin once a week.  . sildenafil (VIAGRA) 50 MG tablet Take 1 tablet (50 mg total) by mouth daily as needed for erectile  dysfunction.  [name] telmisartan (MICARDIS) 20 MG tablet TAKE 1 TABLET BY MOUTH EVERY DAY   No facility-administered encounter medications on file as of 04/22/2020.     Objective:  Lab Results  Component Value Date   HGBA1C 7.9 (H) 04/15/2020   HGBA1C 8.1 (H) 12/04/2019   HGBA1C 7.6 (H) 09/24/2019   Lab Results  Component Value Date   MICROALBUR 30 12/04/2019   LDLCALC 66 12/04/2019   CREATININE 1.59 (H) 04/15/2020   BP Readings from Last 3 Encounters:  04/01/20 114/72  12/13/19 131/76  12/04/19 118/60   Plan:   Telephone follow up appointment with care management team member scheduled for: 04/24/20  Barb Merino, RN, BSN, CCM Care Management Coordinator [place] [facility]  Direct Phone: [phone]

## 2020-04-24 ENCOUNTER — Ambulatory Visit (INDEPENDENT_AMBULATORY_CARE_PROVIDER_SITE_OTHER): Payer: Medicare PPO

## 2020-04-24 ENCOUNTER — Other Ambulatory Visit: Payer: Self-pay

## 2020-04-24 ENCOUNTER — Telehealth: Payer: Self-pay

## 2020-04-24 DIAGNOSIS — E1122 Type 2 diabetes mellitus with diabetic chronic kidney disease: Secondary | ICD-10-CM | POA: Diagnosis not present

## 2020-04-24 DIAGNOSIS — N183 Chronic kidney disease, stage 3 unspecified: Secondary | ICD-10-CM

## 2020-04-24 DIAGNOSIS — E785 Hyperlipidemia, unspecified: Secondary | ICD-10-CM

## 2020-04-24 DIAGNOSIS — I1310 Hypertensive heart and chronic kidney disease without heart failure, with stage 1 through stage 4 chronic kidney disease, or unspecified chronic kidney disease: Secondary | ICD-10-CM

## 2020-04-27 NOTE — Chronic Care Management (AMB) (Signed)
  Chronic Care Management   Outreach Note  04/27/2020 Name: Jose Ponce MRN: [id number] DOB: December 16, 1940  Referred by: Glendale Chard, MD Reason for referral : Chronic Care Management (FU RN CM Call )   An unsuccessful telephone outreach was attempted today. The patient was referred to the case management team for assistance with care management and care coordination.   Follow Up Plan: Telephone follow up appointment with care management team member scheduled for: 05/22/20  Barb Merino, RN, BSN, CCM Care Management Coordinator [place] [facility]  Direct Phone: [phone]

## 2020-05-14 ENCOUNTER — Other Ambulatory Visit: Payer: Self-pay | Admitting: Internal Medicine

## 2020-05-16 ENCOUNTER — Other Ambulatory Visit: Payer: Self-pay | Admitting: Internal Medicine

## 2020-05-18 ENCOUNTER — Ambulatory Visit: Payer: Medicare PPO | Admitting: Registered"

## 2020-05-22 ENCOUNTER — Telehealth: Payer: Self-pay

## 2020-06-23 ENCOUNTER — Telehealth: Payer: Self-pay

## 2020-06-23 NOTE — Chronic Care Management (AMB) (Signed)
° ° °  Chronic Care Management Pharmacy Assistant   Name: JAMES SENN  MRN: [id number] DOB: 09-11-1941  Reason for Encounter: Medication Review/ Patient Assistance  PCP : Glendale Chard, MD  Allergies:  No Known Allergies  Medications: Outpatient Encounter Medications as of 06/23/2020  Medication Sig   allopurinol (ZYLOPRIM) 100 MG tablet TAKE 1/2 TABLET BY MOUTH EVERY DAY   aspirin 81 MG tablet Take 81 mg by mouth daily.   cholecalciferol (VITAMIN D) 400 UNITS TABS tablet Take 5,000 Units by mouth.   clotrimazole-betamethasone (LOTRISONE) cream Apply to affected area 2 times daily   Diclofenac Sodium 3 % GEL Place onto the skin.   Flaxseed, Linseed, (FLAX SEEDS PO) Take 1 tablet by mouth daily.   ketoconazole (NIZORAL) 2 % cream Apply 1 application topically daily.   lidocaine (LIDODERM) 5 % Place 1 patch onto the skin daily. Remove & Discard patch within 12 hours or as directed by MD Place over left lower back.   Linaclotide (LINZESS) 290 MCG CAPS capsule Take 290 mcg by mouth daily.   metoprolol succinate (TOPROL-XL) 50 MG 24 hr tablet TAKE 1 TABLET BY MOUTH ONCE DAILY   rosuvastatin (CRESTOR) 10 MG tablet TAKE 1 TABLET BY MOUTH EVERY DAY   Semaglutide,0.25 or 0.5MG /DOS, (OZEMPIC, 0.25 OR 0.5 MG/DOSE,) 2 MG/1.5ML SOPN Inject 0.25 mg into the skin once a week.   sildenafil (VIAGRA) 50 MG tablet Take 1 tablet (50 mg total) by mouth daily as needed for erectile dysfunction.   telmisartan (MICARDIS) 20 MG tablet TAKE 1 TABLET BY MOUTH EVERY DAY   No facility-administered encounter medications on file as of 06/23/2020.    Current Diagnosis: Patient Active Problem List   Diagnosis Date Noted   Benign hypertensive heart and renal disease 12/04/2019   Coronary artery disease 01/30/2014   Essential hypertension 01/30/2014   Hyperlipidemia 01/30/2014   Diabetes mellitus with stage 3 chronic kidney disease ([place]) 01/30/2014     Follow-Up:  Patient Assistance  Coordination- Filled out Reorder Form for patient Ozempic 0.25mg , patient application due  6/96/7893, printed out for Dr Baird Cancer to sign prior to faxing. tm  Pattricia Boss, [place] Pharmacist Assistant [phone]  06/24/20- Form Faxed to [employer] patient assistance for [employer].  Pattricia Boss, [place] Pharmacist Assistant [phone]

## 2020-06-24 ENCOUNTER — Ambulatory Visit: Payer: Medicare Other

## 2020-06-24 ENCOUNTER — Ambulatory Visit: Payer: Medicare Other | Admitting: Internal Medicine

## 2020-07-03 ENCOUNTER — Ambulatory Visit (INDEPENDENT_AMBULATORY_CARE_PROVIDER_SITE_OTHER): Payer: Medicare PPO

## 2020-07-03 ENCOUNTER — Telehealth: Payer: Medicare PPO

## 2020-07-03 ENCOUNTER — Other Ambulatory Visit: Payer: Self-pay

## 2020-07-03 DIAGNOSIS — E785 Hyperlipidemia, unspecified: Secondary | ICD-10-CM

## 2020-07-03 DIAGNOSIS — I1310 Hypertensive heart and chronic kidney disease without heart failure, with stage 1 through stage 4 chronic kidney disease, or unspecified chronic kidney disease: Secondary | ICD-10-CM

## 2020-07-03 DIAGNOSIS — N183 Chronic kidney disease, stage 3 unspecified: Secondary | ICD-10-CM | POA: Diagnosis not present

## 2020-07-03 DIAGNOSIS — E1122 Type 2 diabetes mellitus with diabetic chronic kidney disease: Secondary | ICD-10-CM

## 2020-07-06 ENCOUNTER — Encounter: Payer: Self-pay | Admitting: Internal Medicine

## 2020-07-06 ENCOUNTER — Other Ambulatory Visit: Payer: Self-pay

## 2020-07-06 ENCOUNTER — Ambulatory Visit: Payer: Medicare PPO | Admitting: Internal Medicine

## 2020-07-06 VITALS — BP 112/66 | HR 75 | Temp 98.1°F | Ht 67.0 in | Wt 163.4 lb

## 2020-07-06 DIAGNOSIS — E1122 Type 2 diabetes mellitus with diabetic chronic kidney disease: Secondary | ICD-10-CM

## 2020-07-06 DIAGNOSIS — N183 Chronic kidney disease, stage 3 unspecified: Secondary | ICD-10-CM

## 2020-07-06 DIAGNOSIS — E7800 Pure hypercholesterolemia, unspecified: Secondary | ICD-10-CM | POA: Diagnosis not present

## 2020-07-06 DIAGNOSIS — I129 Hypertensive chronic kidney disease with stage 1 through stage 4 chronic kidney disease, or unspecified chronic kidney disease: Secondary | ICD-10-CM | POA: Diagnosis not present

## 2020-07-06 DIAGNOSIS — Z1159 Encounter for screening for other viral diseases: Secondary | ICD-10-CM

## 2020-07-06 NOTE — Patient Instructions (Signed)
Diabetic Nephropathy  Diabetic nephropathy is kidney disease that is caused by diabetes (diabetes mellitus). Kidneys are organs that filter and clean blood and get rid of body waste products and extra fluid. Diabetes can cause gradual kidney damage over many years. Diabetic nephropathy that continues to get worse can lead to kidney failure. What are the causes? This condition is caused by kidney damage from diabetes that is not well controlled with treatment. Having high blood sugar (glucose) for a long time because of diabetes can damage blood vessels in the kidneys and cause them to thicken and become scarred. Those changes prevent the kidneys from functioning normally. What increases the risk? This condition is more likely to develop in people with diabetes who:  Have had diabetes for many years.  Have high blood pressure.  Have high blood glucose levels over a long period of time.  Have a family history of kidney disease.  Have a history of tobacco use.  Have certain genes that are passed from parent to child (inherited). What are the signs or symptoms? This condition may not cause symptoms at first. If you do have symptoms, they may include:  Swelling of your hands, feet, or ankles.  Weakness.  Poor appetite.  Nausea.  Confusion.  Tiredness (fatigue).  Trouble sleeping.  Dry, itchy skin. If nephropathy leads to kidney failure, symptoms may include:  Vomiting.  Shortness of breath.  Jerky movements that you cannot control (seizure).  Coma. How is this diagnosed? It is important to diagnose this condition before symptoms develop. You may be screened for diabetic nephropathy at a routine health care visit. Screening tests may include:  Urine tests. These may be done every year.  Urine collection over a 24-hour period to measure kidney function.  Blood tests to measure blood glucose levels and kidney function.  Regular blood pressure monitoring. If your health  care provider suspects diabetic nephropathy, he or she may:  Review your medical history and symptoms.  Do a physical exam.  Do an ultrasound of your kidneys.  Perform a procedure to take a sample of kidney tissue for testing (biopsy). How is this treated? The goal of treatment is to prevent or slow down any damage to your kidneys by managing your diabetes. To do this, it is important to control:  Your blood pressure. ? Your target blood pressure may vary depending on your medical conditions, your age, and other factors. ? To help control blood pressure, you may be prescribed medicines to lower your blood pressure (ACE inhibitors) or to help your body get rid of excess fluid (diuretics).  Your A1c (hemoglobin A1c) level. Generally, the goal of treatment is to maintain an A1c level of less than 7%.  Your blood glucose level.  Your blood lipids. If you have high cholesterol, you may need to take lipid-lowering drugs, such as statins. Other treatments may include:  Medicines, including insulin injections.  Lifestyle changes, such as losing weight, quitting smoking, or making changes to your diet. If your disease progresses to end-stage kidney failure, treatment may include:  Dialysis. This is a procedure to filter your blood with a machine.  Kidney transplant. Follow these instructions at home: Eating and drinking  Eat healthy foods, and eat healthy snacks between meals. Follow instructions from your health care provider about eating and drinking restrictions.  Limit your sodium (salt), protein, or fluid intake as directed.  If you drink alcohol: ? Limit how much you use to:  0-1 drink a day for nonpregnant   women.  0-2 drinks a day for men. ? Be aware of how much alcohol is in your drink. In the [place] one drink equals one 12 oz bottle of beer (355 mL), one 5 oz glass of wine (148 mL), or one 1 oz glass of hard liquor (44 mL). Lifestyle  Maintain a healthy weight. Work  with your health care provider to lose weight, if needed.  Do not use any products that contain nicotine or tobacco, such as cigarettes, e-cigarettes, and chewing tobacco. If you need help quitting, ask your health care provider.  Be physically active every day. Ask your health care provider what type of exercise is best for you.  Work with your health care provider to manage your blood pressure. General instructions      Follow your diabetes management plan as directed. ? Check your blood glucose levels as directed by your health care provider. ? Keep your blood glucose in your target range as directed by your health care provider. ? Have your A1c level checked two or more times a year, or as often as told by your health care provider.  Measure your blood pressure regularly at home, as told by your health care provider.  Take over-the-counter and prescription medicines only as told by your health care provider. These include insulin and supplements.  Keep all follow-up visits and routine visits as told by your health care provider. This is important. Make sure you get screening tests as directed. Where to find more information American Diabetes Association: www.diabetes.org Contact a health care provider if:  You have trouble keeping your blood glucose in your goal range.  Your blood glucose level is higher than 240 mg/dL (13.3 mmol/L) for 2 days in a row.  You have swelling in your hands, ankles, or feet.  You feel weak, tired, or dizzy.  You have sudden muscle tightening (spasms).  You have nausea or vomiting.  You feel tired all the time. Get help right away if:  You are very sleepy.  You faint.  You have: ? A seizure. ? Severe, painful muscle spasms. ? Shortness of breath. ? Chest pain. Summary  Diabetic nephropathy is kidney disease that is caused by diabetes (diabetes mellitus).  Keep your blood sugar (glucose) in your target range as directed by your  health care provider.  Work with your health care provider to manage your blood pressure.  Keep all follow-up visits and routine visits as told by your health care provider. This is important. Make sure you get screening tests as directed. This information is not intended to replace advice given to you by your health care provider. Make sure you discuss any questions you have with your health care provider. Document Revised: 04/22/2019 Document Reviewed: 04/22/2019 Elsevier Patient Education  2020 [employer]  

## 2020-07-06 NOTE — Progress Notes (Signed)
I,[name],acting as a Education administrator for [name], MD.,have documented all relevant documentation on the behalf of [name], MD,as directed by  [name], MD while in the presence of [name], MD.  This visit occurred during the SARS-CoV-2 public health emergency.  Safety protocols were in place, including screening questions prior to the visit, additional usage of staff PPE, and extensive cleaning of exam room while observing appropriate contact time as indicated for disinfecting solutions.  Subjective:     Patient ID: Jose Ponce , male    DOB: 1941-11-16 , 79 y.o.   MRN: [id number]   Chief Complaint  Patient presents with  . Diabetes  . Hypertension    HPI  The patient is here today for a f/u on his diabetes and blood pressure.  He reports compliance with meds.   Diabetes He presents for his follow-up diabetic visit. He has type 2 diabetes mellitus. His disease course has been stable. There are no hypoglycemic associated symptoms. Pertinent negatives for diabetes include no blurred vision. There are no hypoglycemic complications. Diabetic complications include nephropathy. Risk factors for coronary artery disease include diabetes mellitus, dyslipidemia, hypertension and male sex. He is following a diabetic diet. He participates in exercise three times a week. An ACE inhibitor/angiotensin II receptor blocker is being taken.  Hypertension This is a chronic problem. The current episode started more than 1 year ago. The problem has been gradually improving since onset. Pertinent negatives include no blurred vision. The current treatment provides moderate improvement. Compliance problems include exercise.  Hypertensive end-organ damage includes kidney disease and CAD/MI.     Past Medical History:  Diagnosis Date  . Chronic kidney disease   . Coronary artery disease   . Gout   . Hyperlipidemia   . Hypertension   . Type 2 diabetes mellitus (HCC)       Family History  Problem Relation Age of Onset  . Asthma Mother   . Heart disease Father   . Stroke Father   . Heart attack Father 65  . Cancer Sister 66  . Diabetes Brother   . Heart disease Maternal Grandmother      Current Outpatient Medications:  .  allopurinol (ZYLOPRIM) 100 MG tablet, TAKE 1/2 TABLET BY MOUTH EVERY DAY, Disp: 45 tablet, Rfl: 1 .  aspirin 81 MG tablet, Take 81 mg by mouth daily., Disp: , Rfl:  .  Cholecalciferol 125 MCG (5000 UT) CHEW, Chew 5,000 Units by mouth. , Disp: , Rfl:  .  clotrimazole-betamethasone (LOTRISONE) cream, Apply to affected area 2 times daily, Disp: 15 g, Rfl: 1 .  Diclofenac Sodium 3 % GEL, Place onto the skin., Disp: , Rfl:  .  Flaxseed, Linseed, (FLAX SEEDS PO), Take 1 tablet by mouth daily., Disp: , Rfl:  .  ketoconazole (NIZORAL) 2 % cream, Apply 1 application topically daily., Disp: , Rfl:  .  lidocaine (LIDODERM) 5 %, Place 1 patch onto the skin daily. Remove & Discard patch within 12 hours or as directed by MD Place over left lower back., Disp: 30 patch, Rfl: 0 .  Linaclotide (LINZESS) 290 MCG CAPS capsule, Take 290 mcg by mouth daily., Disp: , Rfl:  .  metoprolol succinate (TOPROL-XL) 50 MG 24 hr tablet, TAKE 1 TABLET BY MOUTH ONCE DAILY, Disp: 30 tablet, Rfl: 5 .  rosuvastatin (CRESTOR) 10 MG tablet, TAKE 1 TABLET BY MOUTH EVERY DAY, Disp: 90 tablet, Rfl: 1 .  Semaglutide,0.25 or 0.5MG/DOS, (OZEMPIC, 0.25 OR 0.5  MG/DOSE,) 2 MG/1.5ML SOPN, Inject 0.25 mg into the skin once a week., Disp: 3 pen, Rfl: 3 .  telmisartan (MICARDIS) 20 MG tablet, TAKE 1 TABLET BY MOUTH EVERY DAY, Disp: 90 tablet, Rfl: 1 .  sildenafil (VIAGRA) 50 MG tablet, Take 1 tablet (50 mg total) by mouth daily as needed for erectile dysfunction. (Patient not taking: Reported on 07/06/2020), Disp: 30 tablet, Rfl: 0   No Known Allergies   Review of Systems  Constitutional: Negative.   Eyes: Negative for blurred vision.  Respiratory: Negative.   Cardiovascular:  Negative.   Gastrointestinal: Negative.   Psychiatric/Behavioral: Negative.   All other systems reviewed and are negative.    Today's Vitals   07/06/20 1609  BP: 112/66  Pulse: 75  Temp: 98.1 F (36.7 C)  TempSrc: Oral  Weight: 163 lb 6.4 oz (74.1 kg)  Height: _0  (1.702 m)  PainSc: 0-No pain   Body mass index is 25.59 kg/m.   Objective:  Physical Exam Vitals and nursing note reviewed.  Constitutional:      Appearance: Normal appearance. He is obese.  HENT:     Head: Normocephalic and atraumatic.  Cardiovascular:     Rate and Rhythm: Normal rate and regular rhythm.     Heart sounds: Normal heart sounds.  Pulmonary:     Breath sounds: Normal breath sounds.  Skin:    General: Skin is warm.  Neurological:     General: No focal deficit present.     Mental Status: He is alert and oriented to person, place, and time.         Assessment And Plan:     1. Diabetes mellitus with stage 3 chronic kidney disease (HCC) Comments: Chronic. I will check a1c, renal function today. He is encouraged to aim for at least 150 minutes of exercise per week.  - BMP8+EGFR - Hemoglobin A1c  2. Parenchymal renal hypertension, stage 1 through stage 4 or unspecified chronic kidney disease Comments: Chronic, well controlled. He will continue with current meds. He is encouraged to avoid adding salt to his foods.   3. Pure hypercholesterolemia Comments: Chronic. LDL Jan 2021 was 66, at goal. He will continue with current meds. He is encouraged to avoid fried foods, continue with statin use and to follow a heart healthy diet.   4. Encounter for HCV screening test for low risk patient Comments: I will check HCV- antibody.  - Hepatitis C antibody     Patient was given opportunity to ask questions. Patient verbalized understanding of the plan and was able to repeat key elements of the plan. All questions were answered to their satisfaction.  [name], MD   I, [name], MD,  have reviewed all documentation for this visit. The documentation on 07/12/20 for the exam, diagnosis, procedures, and orders are all accurate and complete.  THE PATIENT IS ENCOURAGED TO PRACTICE SOCIAL DISTANCING DUE TO THE COVID-19 PANDEMIC.

## 2020-07-07 LAB — BMP8+EGFR
BUN/Creatinine Ratio: 16 (ref 10–24)
BUN: 21 mg/dL (ref 8–27)
CO2: 24 mmol/L (ref 20–29)
Calcium: 10 mg/dL (ref 8.6–10.2)
Chloride: 103 mmol/L (ref 96–106)
Creatinine, Ser: 1.32 mg/dL — ABNORMAL HIGH (ref 0.76–1.27)
GFR calc Af Amer: 59 mL/min/{1.73_m2} — ABNORMAL LOW (ref >59)
GFR calc non Af Amer: 51 mL/min/{1.73_m2} — ABNORMAL LOW (ref >59)
Glucose: 132 mg/dL — ABNORMAL HIGH (ref 65–99)
Potassium: 4.4 mmol/L (ref 3.5–5.2)
Sodium: 140 mmol/L (ref 134–144)

## 2020-07-07 LAB — HEMOGLOBIN A1C
Est. average glucose Bld gHb Est-mCnc: 166 mg/dL
Hgb A1c MFr Bld: 7.4 % — ABNORMAL HIGH (ref 4.8–5.6)

## 2020-07-07 LAB — HEPATITIS C ANTIBODY: Hep C Virus Ab: <0.1 s/co ratio (ref 0.0–0.9)

## 2020-07-08 NOTE — Chronic Care Management (AMB) (Signed)
Chronic Care Management   Follow Up Note   07/03/2020 Name: Jose Ponce MRN: [id number] DOB: 01-Oct-1941  Referred by: Glendale Chard, MD Reason for referral : Chronic Care Management (FU RN CM Call )   Jose Ponce is a 79 y.o. year old male who is a primary care patient of Glendale Chard, MD. The CCM team was consulted for assistance with chronic disease management and care coordination needs.    Review of patient status, including review of consultants reports, relevant laboratory and other test results, and collaboration with appropriate care team members and the patient's provider was performed as part of comprehensive patient evaluation and provision of chronic care management services.    SDOH (Social Determinants of Health) assessments performed: Yes - no acute challenges identified at this time See Care Plan activities for detailed interventions related to [place])   Placed outbound CCM RN CM call to patient for a care plan update.     Outpatient Encounter Medications as of 07/03/2020  Medication Sig  . allopurinol (ZYLOPRIM) 100 MG tablet TAKE 1/2 TABLET BY MOUTH EVERY DAY  . aspirin 81 MG tablet Take 81 mg by mouth daily.  . Cholecalciferol 125 MCG (5000 UT) CHEW Chew 5,000 Units by mouth.   . clotrimazole-betamethasone (LOTRISONE) cream Apply to affected area 2 times daily  . Diclofenac Sodium 3 % GEL Place onto the skin.  . Flaxseed, Linseed, (FLAX SEEDS PO) Take 1 tablet by mouth daily.  [name] ketoconazole (NIZORAL) 2 % cream Apply 1 application topically daily.  [name] lidocaine (LIDODERM) 5 % Place 1 patch onto the skin daily. Remove & Discard patch within 12 hours or as directed by MD Place over left lower back.  . Linaclotide (LINZESS) 290 MCG CAPS capsule Take 290 mcg by mouth daily.  . metoprolol succinate (TOPROL-XL) 50 MG 24 hr tablet TAKE 1 TABLET BY MOUTH ONCE DAILY  . rosuvastatin (CRESTOR) 10 MG tablet TAKE 1 TABLET BY MOUTH EVERY DAY  . Semaglutide,0.25 or  0.5MG/DOS, (OZEMPIC, 0.25 OR 0.5 MG/DOSE,) 2 MG/1.5ML SOPN Inject 0.25 mg into the skin once a week.  . sildenafil (VIAGRA) 50 MG tablet Take 1 tablet (50 mg total) by mouth daily as needed for erectile dysfunction. (Patient not taking: Reported on 07/06/2020)  . telmisartan (MICARDIS) 20 MG tablet TAKE 1 TABLET BY MOUTH EVERY DAY   No facility-administered encounter medications on file as of 07/03/2020.     Objective:  Lab Results  Component Value Date   HGBA1C 7.4 (H) 07/06/2020   HGBA1C 7.9 (H) 04/15/2020   HGBA1C 8.1 (H) 12/04/2019   Lab Results  Component Value Date   MICROALBUR 30 12/04/2019   LDLCALC 66 12/04/2019   CREATININE 1.32 (H) 07/06/2020   BP Readings from Last 3 Encounters:  07/06/20 112/66  04/01/20 114/72  12/13/19 131/76    Goals Addressed      Patient Stated   .  "I would like to learn more about portion control for my diabetes" (pt-stated)        Current Barriers:  [name] Knowledge Deficits related to disease process and Self Health Management of Diabetes . Chronic Disease Management support and education needs related to DMT2, HTN, HLD  Nurse Case Manager Clinical Goal(s):  . 07/03/20 New Over the next 90 days, patient will continue working with the CCM team and PCP for disease education and support for improved Self Health management of DM  CCM RN CM  Interventions:  07/03/20 call completed with patient . Evaluation  of current treatment plan related to diabetes and patient's adherence to plan as established by provider. . Provided education to patient re: current A1C of 7.9; Educated on target A1C of <7.0; Reinforced patient education related to ADA recommendations to help lower this number including, following a low carb diabetic diet; taking diabetic medications exactly as prescribed, implementing exercise into daily routine; patient admits he was drinking Gingerale and eating more pasta; discussed since learning his A1C is elevated, he has already made the  necessary changes in his diet and has started exercising  . Determined patient admits he was eating more deserts but has since cut back on these types of foods  . Reviewed medications with patient and discussed current use of Ozempic 0.25 mg weekly injection, pt reports adherence w/o noted SE  . Provided patient with printed educational materials related to Diabetes Management using Meal Planning; Diabetes Care Schedule; Grocery Shopping with Diabetes; Preventing Complications with Diabetes . Advised patient, providing education and rationale, to check cbg before meals and record, calling the CCM team and or PCP provider for findings outside established parameters; FBS 80-130, <180 after meals  . Discussed plans with patient for ongoing care management follow up and provided patient with direct contact information for care management team  Patient Self Care Activities:  . Self administers medications as prescribed . Attends all scheduled provider appointments . Calls pharmacy for medication refills . Performs ADL's independently . Performs IADL's independently . Calls provider office for new concerns or questions  Please see past updates related to this goal by clicking on the "Past Updates" button in the selected goal      .  COMPLETED: I would like to manage my diabetes (pt-stated)        Current Barriers:  . Financial Barriers . Unable to self-inject Ozempic (comes to clinic every Wednesdays for Ozempic injection)  Pharmacist Clinical Goal(s):  [name] Over the next 90 days, patient will demonstrate Improved medication adherence as evidenced by attempting to self inject Ozempic goal re-established on 10/23/19 for 2021 goals of care . Patient picked up 5 boxes of Ozempic.  Refill to be faxed in April 2021 (complete)  Interventions: Met with patient in clinic  on 01/29/20 . Comprehensive medication review performed. . Discussed plans with patient for ongoing care management follow up and  provided patient with direct contact information for care management team . Patient continues to tolerate Ozempic 0.87m weekly without adverse events.  He has not previously tolerated Ozempic 0.527mweekly.  He denies adverse events of hypoglycemia.  He is unable to inject Ozempic (patient fearful of self-injecting) and comes into the office to have Ozempic injection every Wednesday.   . Patient has been approved for Ozempic 0.2533meekly via [employer] assistance program until 11/20/20.  A 4-m70-monthply was shipped to PCP office.  A refill request form must be faxed to [employer] refills. Last refill MUST be ordered by 10/20/20. . Last A1c increased to 8.1% o Counseled extensively on diet.  Patient does not wish to add another medicine nor increase Ozempic dose.  He states he is doing better eating/walking.   o MD would like to add insulin if Bgs continue to increase o He reports most FBG in the 150-180, denies hypoglycemia. We discussed meals.  Patient reports he is doing better with his diet and has cut carbs since we spoke last month.  He is avoiding sugary drinks.  Encouraged patient to adhere to diabetes-healthy diet and exercise  as able and as recommended by PCP.  Patient verbalizes understanding. o Will continue current dose of Ozempic 0.31m weekly.  If patient reverts back to carb heavy diet and Bgs increase, recommend to increase OZEMPIC to 0.59msq weekly.  Patient verbalizes understanding.   o Consider SGLT2 if not amenable to ozempic 0.44m40meekly . T2DM patient on aspirin/statin for prevention as appropriate.  Fill history reviewed.  Rosuvastatin 56m20m0 DS last filled on 12/13/19.   LDL on 1/13/2021as 66.  Patient is ARB (telmisartan) as prescribed.  BP at goal <130.80 . Will follow up with patient in 1 month  Patient Self Care Activities:  . Currently UNABLE TO independently self inject Ozempic at this time . Attends all scheduled provider appointments . Calls pharmacy  for medication refills  Please see past updates related to this goal by clicking on the "Past Updates" button in the selected goal        Plan:   Telephone follow up appointment with care management team member scheduled for: 10/08/20  AngeBarb Merino, BSN, CCM Care Management Coordinator [facility]  Direct Phone: [phone]

## 2020-07-08 NOTE — Patient Instructions (Addendum)
Visit Information  Goals Addressed      Patient Stated   .  "I would like to learn more about portion control for my diabetes" (pt-stated)        Current Barriers:  [name] Knowledge Deficits related to disease process and Self Health Management of Diabetes . Chronic Disease Management support and education needs related to DMT2, HTN, HLD  Nurse Case Manager Clinical Goal(s):  . 07/03/20 New Over the next 90 days, patient will continue working with the CCM team and PCP for disease education and support for improved Self Health management of DM  CCM RN CM  Interventions:  07/03/20 call completed with patient . Evaluation of current treatment plan related to diabetes and patient's adherence to plan as established by provider. . Provided education to patient re: current A1C of 7.9; Educated on target A1C of <7.0; Reinforced patient education related to ADA recommendations to help lower this number including, following a low carb diabetic diet; taking diabetic medications exactly as prescribed, implementing exercise into daily routine; patient admits he was drinking Gingerale and eating more pasta; discussed since learning his A1C is elevated, he has already made the necessary changes in his diet and has started exercising  . Determined patient admits he was eating more deserts but has since cut back on these types of foods  . Reviewed medications with patient and discussed current use of Ozempic 0.25 mg weekly injection, pt reports adherence w/o noted SE  . Provided patient with printed educational materials related to Diabetes Management using Meal Planning; Diabetes Care Schedule; Grocery Shopping with Diabetes; Preventing Complications with Diabetes . Advised patient, providing education and rationale, to check cbg before meals and record, calling the CCM team and or PCP provider for findings outside established parameters; FBS 80-130, <180 after meals  . Discussed plans with patient for ongoing care  management follow up and provided patient with direct contact information for care management team  Patient Self Care Activities:  . Self administers medications as prescribed . Attends all scheduled provider appointments . Calls pharmacy for medication refills . Performs ADL's independently . Performs IADL's independently . Calls provider office for new concerns or questions  Please see past updates related to this goal by clicking on the "Past Updates" button in the selected goal      .  COMPLETED: I would like to manage my diabetes (pt-stated)        Current Barriers:  . Financial Barriers . Unable to self-inject Ozempic (comes to clinic every Wednesdays for Ozempic injection)  Pharmacist Clinical Goal(s):  [name] Over the next 90 days, patient will demonstrate Improved medication adherence as evidenced by attempting to self inject Ozempic goal re-established on 10/23/19 for 2021 goals of care . Patient picked up 5 boxes of Ozempic.  Refill to be faxed in April 2021 (complete)  Interventions: Met with patient in clinic  on 01/29/20 . Comprehensive medication review performed. . Discussed plans with patient for ongoing care management follow up and provided patient with direct contact information for care management team . Patient continues to tolerate Ozempic 0.70m weekly without adverse events.  He has not previously tolerated Ozempic 0.549mweekly.  He denies adverse events of hypoglycemia.  He is unable to inject Ozempic (patient fearful of self-injecting) and comes into the office to have Ozempic injection every Wednesday.   . Patient has been approved for Ozempic 0.2543meekly via [employer] assistance program until 11/20/20.  A 4-m79-monthply was shipped to PCP  office.  A refill request form must be faxed to [employer] for refills. Last refill MUST be ordered by 10/20/20. . Last A1c increased to 8.1% o Counseled extensively on diet.  Patient does not wish to add another  medicine nor increase Ozempic dose.  He states he is doing better eating/walking.   o MD would like to add insulin if Bgs continue to increase o He reports most FBG in the 150-180, denies hypoglycemia. We discussed meals.  Patient reports he is doing better with his diet and has cut carbs since we spoke last month.  He is avoiding sugary drinks.  Encouraged patient to adhere to diabetes-healthy diet and exercise as able and as recommended by PCP.  Patient verbalizes understanding. o Will continue current dose of Ozempic 0.71m weekly.  If patient reverts back to carb heavy diet and Bgs increase, recommend to increase OZEMPIC to 0.554msq weekly.  Patient verbalizes understanding.   o Consider SGLT2 if not amenable to ozempic 0.63m49meekly . T2DM patient on aspirin/statin for prevention as appropriate.  Fill history reviewed.  Rosuvastatin 53m96m0 DS last filled on 12/13/19.   LDL on 1/13/2021as 66.  Patient is ARB (telmisartan) as prescribed.  BP at goal <130.80 . Will follow up with patient in 1 month  Patient Self Care Activities:  . Currently UNABLE TO independently self inject Ozempic at this time . Attends all scheduled provider appointments . Calls pharmacy for medication refills  Please see past updates related to this goal by clicking on the "Past Updates" button in the selected goal         Patient verbalizes understanding of instructions provided today.   Telephone follow up appointment with care management team member scheduled for: 10/08/20  AngeBarb Merino, BSN, CCM Care Management Coordinator [facility] [place]  Direct Phone: [phone]

## 2020-08-22 ENCOUNTER — Other Ambulatory Visit: Payer: Self-pay | Admitting: Internal Medicine

## 2020-08-24 ENCOUNTER — Other Ambulatory Visit: Payer: Self-pay

## 2020-10-07 ENCOUNTER — Ambulatory Visit: Payer: Medicare PPO | Admitting: Internal Medicine

## 2020-10-07 ENCOUNTER — Encounter: Payer: Self-pay | Admitting: Internal Medicine

## 2020-10-07 ENCOUNTER — Other Ambulatory Visit: Payer: Self-pay

## 2020-10-07 VITALS — BP 136/84 | HR 63 | Temp 97.8°F | Ht 67.6 in | Wt 168.0 lb

## 2020-10-07 DIAGNOSIS — R6889 Other general symptoms and signs: Secondary | ICD-10-CM

## 2020-10-07 DIAGNOSIS — E1122 Type 2 diabetes mellitus with diabetic chronic kidney disease: Secondary | ICD-10-CM | POA: Diagnosis not present

## 2020-10-07 DIAGNOSIS — N183 Chronic kidney disease, stage 3 unspecified: Secondary | ICD-10-CM

## 2020-10-07 DIAGNOSIS — I129 Hypertensive chronic kidney disease with stage 1 through stage 4 chronic kidney disease, or unspecified chronic kidney disease: Secondary | ICD-10-CM | POA: Diagnosis not present

## 2020-10-07 DIAGNOSIS — R413 Other amnesia: Secondary | ICD-10-CM

## 2020-10-07 NOTE — Progress Notes (Signed)
Rutherford Nail as a scribe for [name], MD.,have documented all relevant documentation on the behalf of [name], MD,as directed by  [name], MD while in the presence of [name], MD. This visit occurred during the SARS-CoV-2 public health emergency.  Safety protocols were in place, including screening questions prior to the visit, additional usage of staff PPE, and extensive cleaning of exam room while observing appropriate contact time as indicated for disinfecting solutions.  Subjective:     Patient ID: Jose Ponce , male    DOB: 1940-12-30 , 79 y.o.   MRN: [id number]   Chief Complaint  Patient presents with  . Diabetes  . Hypertension    HPI  Pt is here today for diabetes, and blood pressure check. He reports compliance with meds. He denies having any chest pain, palpitations and shortness of breath. He admits he is not exercising on a regular basis.  Diabetes He presents for his follow-up diabetic visit. He has type 2 diabetes mellitus. His disease course has been stable. There are no hypoglycemic associated symptoms. Pertinent negatives for hypoglycemia include no dizziness or headaches. Pertinent negatives for diabetes include no blurred vision, no fatigue, no polydipsia, no polyphagia and no polyuria. There are no hypoglycemic complications. Diabetic complications include nephropathy. Risk factors for coronary artery disease include diabetes mellitus, dyslipidemia, hypertension and male sex. He is following a diabetic diet. He participates in exercise three times a week. An ACE inhibitor/angiotensin II receptor blocker is being taken.  Hypertension This is a chronic problem. The current episode started more than 1 year ago. The problem has been gradually improving since onset. Pertinent negatives include no blurred vision or headaches. The current treatment provides moderate improvement. Compliance problems include exercise.  Hypertensive  end-organ damage includes kidney disease and CAD/MI.     Past Medical History:  Diagnosis Date  . Chronic kidney disease   . Coronary artery disease   . Gout   . Hyperlipidemia   . Hypertension   . Type 2 diabetes mellitus (HCC)      Family History  Problem Relation Age of Onset  . Asthma Mother   . Heart disease Father   . Stroke Father   . Heart attack Father 60  . Cancer Sister 8  . Diabetes Brother   . Heart disease Maternal Grandmother      Current Outpatient Medications:  .  allopurinol (ZYLOPRIM) 100 MG tablet, TAKE 1/2 TABLET BY MOUTH EVERY DAY, Disp: 45 tablet, Rfl: 1 .  aspirin 81 MG tablet, Take 81 mg by mouth daily., Disp: , Rfl:  .  Cholecalciferol 125 MCG (5000 UT) CHEW, Chew 5,000 Units by mouth. , Disp: , Rfl:  .  clotrimazole-betamethasone (LOTRISONE) cream, Apply to affected area 2 times daily, Disp: 15 g, Rfl: 1 .  Diclofenac Sodium 3 % GEL, Place onto the skin., Disp: , Rfl:  .  Flaxseed, Linseed, (FLAX SEEDS PO), Take 1 tablet by mouth daily., Disp: , Rfl:  .  ketoconazole (NIZORAL) 2 % cream, Apply 1 application topically daily., Disp: , Rfl:  .  lidocaine (LIDODERM) 5 %, Place 1 patch onto the skin daily. Remove & Discard patch within 12 hours or as directed by MD Place over left lower back., Disp: 30 patch, Rfl: 0 .  Linaclotide (LINZESS) 290 MCG CAPS capsule, Take 290 mcg by mouth daily., Disp: , Rfl:  .  metoprolol succinate (TOPROL-XL) 50 MG 24 hr tablet, TAKE 1 TABLET BY MOUTH  EVERY DAY, Disp: 90 tablet, Rfl: 1 .  rosuvastatin (CRESTOR) 10 MG tablet, TAKE 1 TABLET BY MOUTH EVERY DAY, Disp: 90 tablet, Rfl: 1 .  Semaglutide,0.25 or 0.5MG/DOS, (OZEMPIC, 0.25 OR 0.5 MG/DOSE,) 2 MG/1.5ML SOPN, Inject 0.25 mg into the skin once a week., Disp: 3 pen, Rfl: 3 .  telmisartan (MICARDIS) 20 MG tablet, TAKE 1 TABLET BY MOUTH EVERY DAY, Disp: 90 tablet, Rfl: 1 .  sildenafil (VIAGRA) 50 MG tablet, Take 1 tablet (50 mg total) by mouth daily as needed for erectile  dysfunction. (Patient not taking: Reported on 07/06/2020), Disp: 30 tablet, Rfl: 0   No Known Allergies   Review of Systems  Constitutional: Negative.  Negative for fatigue.  HENT: Negative.   Eyes: Negative for blurred vision.  Respiratory: Negative.   Cardiovascular: Negative.   Gastrointestinal: Negative.   Endocrine: Negative for polydipsia, polyphagia and polyuria.  Musculoskeletal: Negative.   Skin: Negative.   Neurological: Negative for dizziness and headaches.       He c/o memory issues. States he has noticed that his memory is not as sharp. He is unable to give specific circumstances. Denies family history of dementia.   Psychiatric/Behavioral: Negative.      Today's Vitals   10/07/20 1505  BP: 136/84  Pulse: 63  Temp: 97.8 F (36.6 C)  TempSrc: Oral  Weight: 168 lb (76.2 kg)  Height: 5' 7.6" (1.717 m)  PainSc: 0-No pain   Body mass index is 25.85 kg/m.   Objective:  Physical Exam Vitals and nursing note reviewed.  Constitutional:      Appearance: Normal appearance.  Cardiovascular:     Rate and Rhythm: Normal rate and regular rhythm.     Heart sounds: Normal heart sounds.  Pulmonary:     Effort: Pulmonary effort is normal.     Breath sounds: Normal breath sounds.  Skin:    General: Skin is warm.  Neurological:     General: No focal deficit present.     Mental Status: He is alert.  Psychiatric:        Mood and Affect: Mood normal.         Assessment And Plan:     1. Diabetes mellitus with stage 3 chronic kidney disease ([place]) Comments: I will check labs as below. I will adjust meds as needed. He is aware that he will likely need additional meds to help him achieve glycemic control.  - CMP14+EGFR - Hemoglobin A1c  2. Parenchymal renal hypertension, stage 1 through stage 4 or unspecified chronic kidney disease Comments: Chronic, fair control. Pt is aware optimal BP should be less than 130/80.  Encouraged to follow low sodium diet. Importance of  regular exercise d/w patient.   3. Memory loss Comments: He is concerned about memory - unable to give specifics. I will check labs as listed below.  - Vitamin B12 - TSH - RPR - RPR, quant & T.pallidum antibodies  4. Cold intolerance Comments: I will check CBC and thyroid function. Pt advised this is not unusual in pts on blood thinners, including aspirin.  - CBC no Diff     Patient was given opportunity to ask questions. Patient verbalized understanding of the plan and was able to repeat key elements of the plan. All questions were answered to their satisfaction.  [name], MD   I, [name], MD, have reviewed all documentation for this visit. The documentation on 10/18/20 for the exam, diagnosis, procedures, and orders are all accurate and complete.  THE PATIENT IS ENCOURAGED TO PRACTICE SOCIAL DISTANCING DUE TO THE COVID-19 PANDEMIC.   

## 2020-10-07 NOTE — Patient Instructions (Signed)
Diabetes Mellitus and Exercise Exercising regularly is important for your overall health, especially when you have diabetes (diabetes mellitus). Exercising is not only about losing weight. It has many other health benefits, such as increasing muscle strength and bone density and reducing body fat and stress. This leads to improved fitness, flexibility, and endurance, all of which result in better overall health. Exercise has additional benefits for people with diabetes, including:  Reducing appetite.  Helping to lower and control blood glucose.  Lowering blood pressure.  Helping to control amounts of fatty substances (lipids) in the blood, such as cholesterol and triglycerides.  Helping the body to respond better to insulin (improving insulin sensitivity).  Reducing how much insulin the body needs.  Decreasing the risk for heart disease by: ? Lowering cholesterol and triglyceride levels. ? Increasing the levels of good cholesterol. ? Lowering blood glucose levels. What is my activity plan? Your health care provider or certified diabetes educator can help you make a plan for the type and frequency of exercise (activity plan) that works for you. Make sure that you:  Do at least 150 minutes of moderate-intensity or vigorous-intensity exercise each week. This could be brisk walking, biking, or water aerobics. ? Do stretching and strength exercises, such as yoga or weightlifting, at least 2 times a week. ? Spread out your activity over at least 3 days of the week.  Get some form of physical activity every day. ? Do not go more than 2 days in a row without some kind of physical activity. ? Avoid being inactive for more than 30 minutes at a time. Take frequent breaks to walk or stretch.  Choose a type of exercise or activity that you enjoy, and set realistic goals.  Start slowly, and gradually increase the intensity of your exercise over time. What do I need to know about managing my  diabetes?   Check your blood glucose before and after exercising. ? If your blood glucose is 240 mg/dL (13.3 mmol/L) or higher before you exercise, check your urine for ketones. If you have ketones in your urine, do not exercise until your blood glucose returns to normal. ? If your blood glucose is 100 mg/dL (5.6 mmol/L) or lower, eat a snack containing 15-20 grams of carbohydrate. Check your blood glucose 15 minutes after the snack to make sure that your level is above 100 mg/dL (5.6 mmol/L) before you start your exercise.  Know the symptoms of low blood glucose (hypoglycemia) and how to treat it. Your risk for hypoglycemia increases during and after exercise. Common symptoms of hypoglycemia can include: ? Hunger. ? Anxiety. ? Sweating and feeling clammy. ? Confusion. ? Dizziness or feeling light-headed. ? Increased heart rate or palpitations. ? Blurry vision. ? Tingling or numbness around the mouth, lips, or tongue. ? Tremors or shakes. ? Irritability.  Keep a rapid-acting carbohydrate snack available before, during, and after exercise to help prevent or treat hypoglycemia.  Avoid injecting insulin into areas of the body that are going to be exercised. For example, avoid injecting insulin into: ? The arms, when playing tennis. ? The legs, when jogging.  Keep records of your exercise habits. Doing this can help you and your health care provider adjust your diabetes management plan as needed. Write down: ? Food that you eat before and after you exercise. ? Blood glucose levels before and after you exercise. ? The type and amount of exercise you have done. ? When your insulin is expected to peak, if you use   insulin. Avoid exercising at times when your insulin is peaking.  When you start a new exercise or activity, work with your health care provider to make sure the activity is safe for you, and to adjust your insulin, medicines, or food intake as needed.  Drink plenty of water while  you exercise to prevent dehydration or heat stroke. Drink enough fluid to keep your urine clear or pale yellow. Summary  Exercising regularly is important for your overall health, especially when you have diabetes (diabetes mellitus).  Exercising has many health benefits, such as increasing muscle strength and bone density and reducing body fat and stress.  Your health care provider or certified diabetes educator can help you make a plan for the type and frequency of exercise (activity plan) that works for you.  When you start a new exercise or activity, work with your health care provider to make sure the activity is safe for you, and to adjust your insulin, medicines, or food intake as needed. This information is not intended to replace advice given to you by your health care provider. Make sure you discuss any questions you have with your health care provider. Document Revised: 06/01/2017 Document Reviewed: 04/18/2016 Elsevier Patient Education  2020 Elsevier Inc. Hypertension, Adult Hypertension is another name for high blood pressure. High blood pressure forces your heart to work harder to pump blood. This can cause problems over time. There are two numbers in a blood pressure reading. There is a top number (systolic) over a bottom number (diastolic). It is best to have a blood pressure that is below 120/80. Healthy choices can help lower your blood pressure, or you may need medicine to help lower it. What are the causes? The cause of this condition is not known. Some conditions may be related to high blood pressure. What increases the risk?  Smoking.  Having type 2 diabetes mellitus, high cholesterol, or both.  Not getting enough exercise or physical activity.  Being overweight.  Having too much fat, sugar, calories, or salt (sodium) in your diet.  Drinking too much alcohol.  Having long-term (chronic) kidney disease.  Having a family history of high blood pressure.  Age.  Risk increases with age.  Race. You may be at higher risk if you are African American.  Gender. Men are at higher risk than women before age 45. After age 65, women are at higher risk than men.  Having obstructive sleep apnea.  Stress. What are the signs or symptoms?  High blood pressure may not cause symptoms. Very high blood pressure (hypertensive crisis) may cause: ? Headache. ? Feelings of worry or nervousness (anxiety). ? Shortness of breath. ? Nosebleed. ? A feeling of being sick to your stomach (nausea). ? Throwing up (vomiting). ? Changes in how you see. ? Very bad chest pain. ? Seizures. How is this treated?  This condition is treated by making healthy lifestyle changes, such as: ? Eating healthy foods. ? Exercising more. ? Drinking less alcohol.  Your health care provider may prescribe medicine if lifestyle changes are not enough to get your blood pressure under control, and if: ? Your top number is above 130. ? Your bottom number is above 80.  Your personal target blood pressure may vary. Follow these instructions at home: Eating and drinking   If told, follow the DASH eating plan. To follow this plan: ? Fill one half of your plate at each meal with fruits and vegetables. ? Fill one fourth of your plate at each   meal with whole grains. Whole grains include whole-wheat pasta, brown rice, and whole-grain bread. ? Eat or drink low-fat dairy products, such as skim milk or low-fat yogurt. ? Fill one fourth of your plate at each meal with low-fat (lean) proteins. Low-fat proteins include fish, chicken without skin, eggs, beans, and tofu. ? Avoid fatty meat, cured and processed meat, or chicken with skin. ? Avoid pre-made or processed food.  Eat less than 1,500 mg of salt each day.  Do not drink alcohol if: ? Your doctor tells you not to drink. ? You are pregnant, may be pregnant, or are planning to become pregnant.  If you drink alcohol: ? Limit how much you  use to:  0-1 drink a day for women.  0-2 drinks a day for men. ? Be aware of how much alcohol is in your drink. In the [place] one drink equals one 12 oz bottle of beer (355 mL), one 5 oz glass of wine (148 mL), or one 1 oz glass of hard liquor (44 mL). Lifestyle   Work with your doctor to stay at a healthy weight or to lose weight. Ask your doctor what the best weight is for you.  Get at least 30 minutes of exercise most days of the week. This may include walking, swimming, or biking.  Get at least 30 minutes of exercise that strengthens your muscles (resistance exercise) at least 3 days a week. This may include lifting weights or doing Pilates.  Do not use any products that contain nicotine or tobacco, such as cigarettes, e-cigarettes, and chewing tobacco. If you need help quitting, ask your doctor.  Check your blood pressure at home as told by your doctor.  Keep all follow-up visits as told by your doctor. This is important. Medicines  Take over-the-counter and prescription medicines only as told by your doctor. Follow directions carefully.  Do not skip doses of blood pressure medicine. The medicine does not work as well if you skip doses. Skipping doses also puts you at risk for problems.  Ask your doctor about side effects or reactions to medicines that you should watch for. Contact a doctor if you:  Think you are having a reaction to the medicine you are taking.  Have headaches that keep coming back (recurring).  Feel dizzy.  Have swelling in your ankles.  Have trouble with your vision. Get help right away if you:  Get a very bad headache.  Start to feel mixed up (confused).  Feel weak or numb.  Feel faint.  Have very bad pain in your: ? Chest. ? Belly (abdomen).  Throw up more than once.  Have trouble breathing. Summary  Hypertension is another name for high blood pressure.  High blood pressure forces your heart to work harder to pump blood.  For  most people, a normal blood pressure is less than 120/80.  Making healthy choices can help lower blood pressure. If your blood pressure does not get lower with healthy choices, you may need to take medicine. This information is not intended to replace advice given to you by your health care provider. Make sure you discuss any questions you have with your health care provider. Document Revised: 07/18/2018 Document Reviewed: 07/18/2018 Elsevier Patient Education  2020 Elsevier Inc.  

## 2020-10-08 ENCOUNTER — Telehealth: Payer: Medicare PPO

## 2020-10-08 LAB — CMP14+EGFR
ALT: 17 IU/L (ref 0–44)
AST: 17 IU/L (ref 0–40)
Albumin/Globulin Ratio: 1.3 (ref 1.2–2.2)
Albumin: 4.3 g/dL (ref 3.7–4.7)
Alkaline Phosphatase: 70 IU/L (ref 44–121)
BUN/Creatinine Ratio: 11 (ref 10–24)
BUN: 17 mg/dL (ref 8–27)
Bilirubin Total: 0.6 mg/dL (ref 0.0–1.2)
CO2: 22 mmol/L (ref 20–29)
Calcium: 9.4 mg/dL (ref 8.6–10.2)
Chloride: 103 mmol/L (ref 96–106)
Creatinine, Ser: 1.48 mg/dL — ABNORMAL HIGH (ref 0.76–1.27)
GFR calc Af Amer: 52 mL/min/{1.73_m2} — ABNORMAL LOW (ref >59)
GFR calc non Af Amer: 45 mL/min/{1.73_m2} — ABNORMAL LOW (ref >59)
Globulin, Total: 3.3 g/dL (ref 1.5–4.5)
Glucose: 158 mg/dL — ABNORMAL HIGH (ref 65–99)
Potassium: 4.8 mmol/L (ref 3.5–5.2)
Sodium: 139 mmol/L (ref 134–144)
Total Protein: 7.6 g/dL (ref 6.0–8.5)

## 2020-10-08 LAB — CBC
Hematocrit: 42.8 % (ref 37.5–51.0)
Hemoglobin: 15 g/dL (ref 13.0–17.7)
MCH: 32.2 pg (ref 26.6–33.0)
MCHC: 35 g/dL (ref 31.5–35.7)
MCV: 92 fL (ref 79–97)
Platelets: 214 10*3/uL (ref 150–450)
RBC: 4.66 x10E6/uL (ref 4.14–5.80)
RDW: 12.2 % (ref 11.6–15.4)
WBC: 4.8 10*3/uL (ref 3.4–10.8)

## 2020-10-08 LAB — HEMOGLOBIN A1C
Est. average glucose Bld gHb Est-mCnc: 206 mg/dL
Hgb A1c MFr Bld: 8.8 % — ABNORMAL HIGH (ref 4.8–5.6)

## 2020-10-08 LAB — RPR, QUANT+TP ABS (REFLEX)
Rapid Plasma Reagin, Quant: 1:1 {titer} — ABNORMAL HIGH
T Pallidum Abs: REACTIVE — AB

## 2020-10-08 LAB — TSH: TSH: 2.03 u[IU]/mL (ref 0.450–4.500)

## 2020-10-08 LAB — RPR: RPR Ser Ql: REACTIVE — AB

## 2020-10-08 LAB — VITAMIN B12: Vitamin B-12: 430 pg/mL (ref 232–1245)

## 2020-10-13 ENCOUNTER — Other Ambulatory Visit: Payer: Self-pay

## 2020-10-13 ENCOUNTER — Telehealth: Payer: Medicare PPO

## 2020-10-13 ENCOUNTER — Ambulatory Visit: Payer: Self-pay

## 2020-10-13 DIAGNOSIS — E785 Hyperlipidemia, unspecified: Secondary | ICD-10-CM

## 2020-10-13 DIAGNOSIS — I10 Essential (primary) hypertension: Secondary | ICD-10-CM

## 2020-10-13 DIAGNOSIS — N1831 Chronic kidney disease, stage 3a: Secondary | ICD-10-CM

## 2020-10-13 DIAGNOSIS — E1122 Type 2 diabetes mellitus with diabetic chronic kidney disease: Secondary | ICD-10-CM

## 2020-10-13 DIAGNOSIS — N183 Chronic kidney disease, stage 3 unspecified: Secondary | ICD-10-CM

## 2020-10-14 NOTE — Chronic Care Management (AMB) (Signed)
Chronic Care Management   Follow Up Note   10/13/2020 Name: Jose Ponce MRN: [id number] DOB: 11-09-1941  Referred by: Glendale Chard, MD Reason for referral : Chronic Care Management (CCM RNCM FU Call)   Jose Ponce is a 79 y.o. year old male who is a primary care patient of Glendale Chard, MD. The CCM team was consulted for assistance with chronic disease management and care coordination needs.    Review of patient status, including review of consultants reports, relevant laboratory and other test results, and collaboration with appropriate care team members and the patient's provider was performed as part of comprehensive patient evaluation and provision of chronic care management services.    SDOH (Social Determinants of Health) assessments performed: Yes - no acute challenges identified See Care Plan activities for detailed interventions related to [place])   Placed outbound CCM RN CM follow up call to patient for a care plan update.     Outpatient Encounter Medications as of 10/13/2020  Medication Sig  . allopurinol (ZYLOPRIM) 100 MG tablet TAKE 1/2 TABLET BY MOUTH EVERY DAY  . aspirin 81 MG tablet Take 81 mg by mouth daily.  . Cholecalciferol 125 MCG (5000 UT) CHEW Chew 5,000 Units by mouth.   . clotrimazole-betamethasone (LOTRISONE) cream Apply to affected area 2 times daily  . Diclofenac Sodium 3 % GEL Place onto the skin.  . Flaxseed, Linseed, (FLAX SEEDS PO) Take 1 tablet by mouth daily.  [name] ketoconazole (NIZORAL) 2 % cream Apply 1 application topically daily.  [name] lidocaine (LIDODERM) 5 % Place 1 patch onto the skin daily. Remove & Discard patch within 12 hours or as directed by MD Place over left lower back.  . Linaclotide (LINZESS) 290 MCG CAPS capsule Take 290 mcg by mouth daily.  . metoprolol succinate (TOPROL-XL) 50 MG 24 hr tablet TAKE 1 TABLET BY MOUTH EVERY DAY  . rosuvastatin (CRESTOR) 10 MG tablet TAKE 1 TABLET BY MOUTH EVERY DAY  . Semaglutide,0.25 or  0.5MG /DOS, (OZEMPIC, 0.25 OR 0.5 MG/DOSE,) 2 MG/1.5ML SOPN Inject 0.25 mg into the skin once a week.  . sildenafil (VIAGRA) 50 MG tablet Take 1 tablet (50 mg total) by mouth daily as needed for erectile dysfunction. (Patient not taking: Reported on 07/06/2020)  . telmisartan (MICARDIS) 20 MG tablet TAKE 1 TABLET BY MOUTH EVERY DAY   No facility-administered encounter medications on file as of 10/13/2020.     Objective:  Lab Results  Component Value Date   HGBA1C 8.8 (H) 10/07/2020   HGBA1C 7.4 (H) 07/06/2020   HGBA1C 7.9 (H) 04/15/2020   Lab Results  Component Value Date   MICROALBUR 30 12/04/2019   LDLCALC 66 12/04/2019   CREATININE 1.48 (H) 10/07/2020   BP Readings from Last 3 Encounters:  10/07/20 136/84  07/06/20 112/66  04/01/20 114/72   Goals Addressed      Patient Stated   .  "I would like to learn more about portion control for my diabetes" (pt-stated)        Current Barriers:  [name] Knowledge Deficits related to disease process and Self Health Management of Diabetes . Chronic Disease Management support and education needs related to DMT2, HTN, HLD  Nurse Case Manager Clinical Goal(s):  . 07/03/20 New Over the next 90 days, patient will continue working with the CCM team and PCP for disease education and support for improved Self Health management of DM  CCM RN CM  Interventions:  10/13/20 call completed with patient . Evaluation of current treatment  plan related to diabetes and patient's adherence to plan as established by provider . Provided education to patient re: current A1c is elevated to 8.8 from 7.9; Educated on target A1c of <7.0; Re-educated on daily glycemic control FBS 80-130, <180 after meals; Re-educated dietary and exercise recommendations . Determined patient admits he is drinking more sugary drinks but has since cut back on these types of foods  . Reviewed medications with patient and discussed current use of Ozempic 0.25 mg weekly injection, pt reports  adherence w/o noted SE  . Advised patient, providing education and rationale, to check cbg before meals and record, calling the CCM team and or PCP provider for findings outside established parameters . Discussed Dr. Baird Cancer is considering adding an additional antihyperglycemic to his current regimen . Collaborated with Dr. Baird Cancer to determine she is consider adding insulin or [place]; Pharm D referral sent for further education and assistance  . Discussed plans with patient for ongoing care management follow up and provided patient with direct contact information for care management team  Patient Self Care Activities:  . Self administers medications as prescribed . Attends all scheduled provider appointments . Calls pharmacy for medication refills . Performs ADL's independently . Performs IADL's independently . Calls provider office for new concerns or questions  Please see past updates related to this goal by clicking on the "Past Updates" button in the selected goal      .  "to improve my kidney function" (pt-stated)        CARE PLAN ENTRY (see longitudinal plan of care for additional care plan information)  Current Barriers:  [name] Knowledge Deficits related to disease education and support to improve  . Chronic Disease Management support and education needs related to DMT2, HTN, HLD, Chronic Renal Disease stage IIIb  Nurse Case Manager Clinical Goal(s):  [name] Over the next 90 days, patient will work with the CCM team and PCP to address needs related to disease education and support for improved Self Health management of stage IIIb CKD  CCM RN CM Interventions:  10/13/21 call completed with patient  . Inter-disciplinary care team collaboration (see longitudinal plan of care) . Evaluation of current treatment plan related to stage III CKD and patient's adherence to plan as established by provider. . Provided education to patient re: current GFR 45; Educated on stages of CKD; Educated on  dietary recommendations to improve kidney function; Educated on importance of drinking plenty of water to help improve renal function . Discussed plans with patient for ongoing care management follow up and provided patient with direct contact information for care management team . Provided patient with printed educational materials related to Eating Right for Kidney Disease; The Best Salt Substitute for Kidney Patients; Stages of Chronic Kidney Disease; Eating Right for Chronic Kidney Disease   Patient Self Care Activities:  . Self administers medications as prescribed . Attend all scheduled provider appointments . Call pharmacy for medication refills . Call provider office for new concerns or questions  Initial goal documentation       Plan:   Telephone follow up appointment with care management team member scheduled for: 12/18/19  Barb Merino, RN, BSN, CCM Care Management Coordinator [place] [facility]  Direct Phone: [phone]

## 2020-10-14 NOTE — Patient Instructions (Signed)
Visit Information  Goals Addressed      Patient Stated   .  "I would like to learn more about portion control for my diabetes" (pt-stated)        Current Barriers:  [name] Knowledge Deficits related to disease process and Self Health Management of Diabetes . Chronic Disease Management support and education needs related to DMT2, HTN, HLD  Nurse Case Manager Clinical Goal(s):  . 07/03/20 New Over the next 90 days, patient will continue working with the CCM team and PCP for disease education and support for improved Self Health management of DM  CCM RN CM  Interventions:  10/13/20 call completed with patient . Evaluation of current treatment plan related to diabetes and patient's adherence to plan as established by provider . Provided education to patient re: current A1c is elevated to 8.8 from 7.9; Educated on target A1c of <7.0; Re-educated on daily glycemic control FBS 80-130, <180 after meals; Re-educated dietary and exercise recommendations . Determined patient admits he is drinking more sugary drinks but has since cut back on these types of foods  . Reviewed medications with patient and discussed current use of Ozempic 0.25 mg weekly injection, pt reports adherence w/o noted SE  . Advised patient, providing education and rationale, to check cbg before meals and record, calling the CCM team and or PCP provider for findings outside established parameters . Discussed Dr. Baird Cancer is considering adding an additional antihyperglycemic to his current regimen . Collaborated with Dr. Baird Cancer to determine she is consider adding insulin or [place]; Pharm D referral sent for further education and assistance  . Discussed plans with patient for ongoing care management follow up and provided patient with direct contact information for care management team  Patient Self Care Activities:  . Self administers medications as prescribed . Attends all scheduled provider appointments . Calls pharmacy for  medication refills . Performs ADL's independently . Performs IADL's independently . Calls provider office for new concerns or questions  Please see past updates related to this goal by clicking on the "Past Updates" button in the selected goal      .  "to improve my kidney function" (pt-stated)        CARE PLAN ENTRY (see longitudinal plan of care for additional care plan information)  Current Barriers:  [name] Knowledge Deficits related to disease education and support to improve  . Chronic Disease Management support and education needs related to DMT2, HTN, HLD, Chronic Renal Disease stage IIIb  Nurse Case Manager Clinical Goal(s):  [name] Over the next 90 days, patient will work with the CCM team and PCP to address needs related to disease education and support for improved Self Health management of stage IIIb CKD  CCM RN CM Interventions:  10/13/21 call completed with patient  . Inter-disciplinary care team collaboration (see longitudinal plan of care) . Evaluation of current treatment plan related to stage III CKD and patient's adherence to plan as established by provider. . Provided education to patient re: current GFR 45; Educated on stages of CKD; Educated on dietary recommendations to improve kidney function; Educated on importance of drinking plenty of water to help improve renal function . Discussed plans with patient for ongoing care management follow up and provided patient with direct contact information for care management team . Provided patient with printed educational materials related to Eating Right for Kidney Disease; The Best Salt Substitute for Kidney Patients; Stages of Chronic Kidney Disease; Eating Right for Chronic Kidney Disease   Patient  Self Care Activities:  . Self administers medications as prescribed . Attend all scheduled provider appointments . Call pharmacy for medication refills . Call provider office for new concerns or questions  Initial goal  documentation       The patient verbalized understanding of instructions, educational materials, and care plan provided today and declined offer to receive copy of patient instructions, educational materials, and care plan.   Telephone follow up appointment with care management team member scheduled for:  12/17/20  Barb Merino, RN, BSN, CCM Care Management Coordinator [place] [facility]  Direct Phone: [phone]

## 2020-10-19 ENCOUNTER — Telehealth: Payer: Self-pay | Admitting: *Deleted

## 2020-10-19 NOTE — Chronic Care Management (AMB) (Signed)
  Chronic Care Management   Note  10/19/2020 Name: STEFAN MARKARIAN MRN: [id number] DOB: 1941-02-24  ABDALLA NARAMORE is a 79 y.o. year old male who is a primary care patient of Glendale Chard, MD. TU BAYLE is currently enrolled in care management services. An additional referral for Pharmacy was placed.   Follow up plan: Telephone appointment with care management team member scheduled for: 11/05/2020  [place] [facility]  Direct Dial [phone].

## 2020-10-26 ENCOUNTER — Telehealth: Payer: Self-pay

## 2020-10-26 NOTE — Chronic Care Management (AMB) (Signed)
    Chronic Care Management Pharmacy Assistant   Name: Jose Ponce  MRN: [id number] DOB: [birth year]  Reason for Encounter: Medication Review/ Patient Assistance Coordination  PCP : Jose Chard, MD  10/26/2020- Jose Ponce, CPA called patient to perform initial questions for initial visit with Jose Ponce, CPP on 11/05/2020, no answer, left message to return call. Patient called back but call was missed. Called patient back, no answer, left message to return call. Patient Assistance form with [employer]27 Application started for patient Ozempic 0.25 mg, awaiting patient signature, income documentation and provider signature.   10/28/2020- Called patient again for initial questions for initial visit with Jose Ponce, CPP, no answer, left message to return call.   Allergies:  No Known Allergies  Medications: Outpatient Encounter Medications as of 10/26/2020  Medication Sig  . allopurinol (ZYLOPRIM) 100 MG tablet TAKE 1/2 TABLET BY MOUTH EVERY DAY  . aspirin 81 MG tablet Take 81 mg by mouth daily.  . Cholecalciferol 125 MCG (5000 UT) CHEW Chew 5,000 Units by mouth.   . clotrimazole-betamethasone (LOTRISONE) cream Apply to affected area 2 times daily  . Diclofenac Sodium 3 % GEL Place onto the skin.  . Flaxseed, Linseed, (FLAX SEEDS PO) Take 1 tablet by mouth daily.  [name] ketoconazole (NIZORAL) 2 % cream Apply 1 application topically daily.  [name] lidocaine (LIDODERM) 5 % Place 1 patch onto the skin daily. Remove & Discard patch within 12 hours or as directed by MD Place over left lower back.  . Linaclotide (LINZESS) 290 MCG CAPS capsule Take 290 mcg by mouth daily.  . metoprolol succinate (TOPROL-XL) 50 MG 24 hr tablet TAKE 1 TABLET BY MOUTH EVERY DAY  . rosuvastatin (CRESTOR) 10 MG tablet TAKE 1 TABLET BY MOUTH EVERY DAY  . Semaglutide,0.25 or 0.5MG /DOS, (OZEMPIC, 0.25 OR 0.5 MG/DOSE,) 2 MG/1.5ML SOPN Inject 0.25 mg into the skin once a week.  . sildenafil (VIAGRA) 50 MG  tablet Take 1 tablet (50 mg total) by mouth daily as needed for erectile dysfunction. (Patient not taking: Reported on 07/06/2020)  . telmisartan (MICARDIS) 20 MG tablet TAKE 1 TABLET BY MOUTH EVERY DAY   No facility-administered encounter medications on file as of 10/26/2020.    Current Diagnosis: Patient Active Problem List   Diagnosis Date Noted  . Benign hypertensive heart and renal disease 12/04/2019  . Coronary artery disease 01/30/2014  . Essential hypertension 01/30/2014  . Hyperlipidemia 01/30/2014  . Diabetes mellitus with stage 3 chronic kidney disease ([place]) 01/30/2014     Follow-Up:  Patient Assistance Coordination and Pharmacist Review   11/02/2020- No return call from patient for Initial Visit.  Jose Ponce, CPP notified.  Jose Ponce, [place] Pharmacist Assistant [phone]

## 2020-11-05 ENCOUNTER — Ambulatory Visit: Payer: Medicare PPO

## 2020-11-05 ENCOUNTER — Encounter: Payer: Self-pay | Admitting: Nurse Practitioner

## 2020-11-05 ENCOUNTER — Ambulatory Visit: Payer: Medicare PPO | Admitting: Nurse Practitioner

## 2020-11-05 ENCOUNTER — Other Ambulatory Visit: Payer: Self-pay

## 2020-11-05 ENCOUNTER — Encounter: Payer: Self-pay | Admitting: Internal Medicine

## 2020-11-05 VITALS — BP 132/64 | HR 68 | Temp 98.6°F | Ht 67.4 in | Wt 164.0 lb

## 2020-11-05 DIAGNOSIS — E1122 Type 2 diabetes mellitus with diabetic chronic kidney disease: Secondary | ICD-10-CM

## 2020-11-05 DIAGNOSIS — N183 Chronic kidney disease, stage 3 unspecified: Secondary | ICD-10-CM

## 2020-11-05 DIAGNOSIS — R6889 Other general symptoms and signs: Secondary | ICD-10-CM

## 2020-11-05 DIAGNOSIS — I10 Essential (primary) hypertension: Secondary | ICD-10-CM

## 2020-11-05 DIAGNOSIS — E785 Hyperlipidemia, unspecified: Secondary | ICD-10-CM

## 2020-11-05 DIAGNOSIS — M1A30X0 Chronic gout due to renal impairment, unspecified site, without tophus (tophi): Secondary | ICD-10-CM

## 2020-11-05 NOTE — Chronic Care Management (AMB) (Signed)
Chronic Care Management Pharmacy  Name: Jose Ponce  MRN: [id number] DOB: 1941/10/24   Chief Complaint/ HPI  Jose Ponce,  79 y.o. , male presents for their Initial CCM visit with the clinical pharmacist In office. Patient is a Writer of [employer], he received a masters from [employer] and then three more masters degrees from [employer]. He has one daughter and two sons who are not his children but he raised them after their father passed away. He reports having open heart surgery in the past and knows the importance of his health. He has been divorced for over 10 years and lives alone, he is still great friends with his ex-wife. He is very busy and active. He goes to church regularly.   He is grateful for the team at [facility] because they keep him healthy and take great care of him. Walking 2 or three days an hour to a 1/2 hour a mile or 2 miles. Patient reports that he is sleeping well for at least 7 hours a night.   PCP : Jose Chard, MD  Their chronic conditions include: Hypertension, Hyperlipidemia, Type 2 Diabetes, Gout   Office Visits:      Consult Visit:    Medications: Outpatient Encounter Medications as of 11/05/2020  Medication Sig  . allopurinol (ZYLOPRIM) 100 MG tablet TAKE 1/2 TABLET BY MOUTH EVERY DAY  . aspirin 81 MG tablet Take 81 mg by mouth daily.  . Cholecalciferol 125 MCG (5000 UT) CHEW Chew 5,000 Units by mouth.   . clotrimazole-betamethasone (LOTRISONE) cream Apply to affected area 2 times daily  . Diclofenac Sodium 3 % GEL Place onto the skin.  . Flaxseed, Linseed, (FLAX SEEDS PO) Take 1 tablet by mouth daily.  [name] ketoconazole (NIZORAL) 2 % cream Apply 1 application topically daily.  [name] lidocaine (LIDODERM) 5 % Place 1 patch onto the skin daily. Remove & Discard patch within 12 hours or as directed by MD Place over left lower back.  . Linaclotide (LINZESS) 290 MCG CAPS capsule Take 290 mcg by mouth daily.  . metoprolol succinate (TOPROL-XL) 50 MG  24 hr tablet TAKE 1 TABLET BY MOUTH EVERY DAY  . rosuvastatin (CRESTOR) 10 MG tablet TAKE 1 TABLET BY MOUTH EVERY DAY  . Semaglutide,0.25 or 0.5MG/DOS, (OZEMPIC, 0.25 OR 0.5 MG/DOSE,) 2 MG/1.5ML SOPN Inject 0.25 mg into the skin once a week.  . sildenafil (VIAGRA) 50 MG tablet Take 1 tablet (50 mg total) by mouth daily as needed for erectile dysfunction. (Patient not taking: Reported on 07/06/2020)  . telmisartan (MICARDIS) 20 MG tablet TAKE 1 TABLET BY MOUTH EVERY DAY   No facility-administered encounter medications on file as of 11/05/2020.     Current Diagnosis/Assessment:  Goals Addressed            This Visit's Progress   . Pharmacy Care Plan       CARE PLAN ENTRY (see longitudinal plan of care for additional care plan information)  Current Barriers:  . Chronic Disease Management support, education, and care coordination needs related to Hypertension, Hyperlipidemia, Diabetes, and Gout   Hypertension BP Readings from Last 3 Encounters:  11/05/20 132/64  10/07/20 136/84  07/06/20 112/66   . Pharmacist Clinical Goal(s): o Over the next 90 days, patient will work with PharmD and providers to maintain BP goal <140/90 . Current regimen:  o Telmisartan 20 MG - Take 1 tablet daily o Metoprolol Succinate 50 MG- Take 1 tablet daily  . Interventions: o  Dietary and exercise recommendations provided - Limit salt intake - Increase physical activity to 30 minutes 3 times weekly, with a long term goal of 30 minutes 5 times weekly . Patient self care activities - Over the next 30 days, patient will: o Check BP once a week, document, and provide at future appointments o Ensure daily salt intake < 2300 mg/day  Hyperlipidemia Lab Results  Component Value Date/Time   LDLCALC 66 12/04/2019 05:00 PM   . Pharmacist Clinical Goal(s): o Over the next 90 days, patient will work with PharmD and providers to maintain LDL goal < 70 . Current regimen:  o Crestor 10 MG - Take daily o ASA 81  MG- Take daily . Interventions: o Dietary and exercise recommendations provided - Increase intake of healthy fats (i.e avocados, walnuts, flaxseed) - Decrease intake of foods high in saturated and trans fat (i.e. chips)  . Patient self care activities - Over the next 90 days, patient will: o Work to improve diet, increase heart healthy fats and decrease trans/saturated fats o Try to exercise for 30 minutes daily 5 times per week  Diabetes Lab Results  Component Value Date/Time   HGBA1C 8.8 (H) 10/07/2020 03:52 PM   HGBA1C 7.4 (H) 07/06/2020 04:53 PM   . Pharmacist Clinical Goal(s): o Over the next 90 days, patient will work with PharmD and providers to achieve A1c goal <8% . Current regimen:  o Farxiga 5 MG - take 1 tablet by mouth daily o Ozempic 0.5 MG - inject weekly into the skin on Wednesday  . Interventions: o Advised patient to continue checking blood sugar daily and record o Provided dietary and exercise recommendations o Discussed the importance of diet and exercise to reach hemoglobin A1c goal of less than 7% . Patient self care activities - Over the next 90 days, patient will: o Check blood sugar once daily, document, and provide at future appointments o Contact provider with any episodes of hypoglycemia  GOUT . Pharmacist Clinical Goal(s) o Over the next 90 days, patient will work with PharmD and providers to reduce flare ups . Current regimen:  o Allopurinol 100 MG - take 1/2 tablet by mouth everyday . Interventions: o Will avoid foods that may cause gout symptoms.  . Patient self care activities - Over the next 90 days, patient will: o Limit gout flare ups  Medication management . Pharmacist Clinical Goal(s): o Over the next 90 days, patient will work with PharmD and providers to maintain optimal medication adherence . Current pharmacy: [facility] . Interventions o Comprehensive medication review performed. o Continue current medication management  strategy . Patient self care activities - Over the next 90 days, patient will: o Focus on medication adherence by taking medication everyday on a routine basis o Take medications as prescribed o Report any questions or concerns to PharmD and/or provider(s)  Initial goal documentation       Hypertension   BP goal is:  <140/90  Office blood pressures are  BP Readings from Last 3 Encounters:  11/05/20 132/64  10/07/20 136/84  07/06/20 112/66   Patient checks BP at home infrequently Patient home BP readings are ranging: readings have been normal  Patient has failed these meds in the past:  Patient is currently controlled on the following medications:  [name] Metoprolol Succinate ER 50 MG - Take daily  . Telmisartan 20 MG- Take daily   We discussed   Checking BP appropriately  Pt was taught how to check his BP at home  Patient medication regimen   Takes it daily in the morning does not miss his medication   Diet extensively  Pt reports using a pinch of salt in coffee and vegetables   Drinks a cup of coffee (1/2 decaf and 1/2 hazelnut) with salt and cinnamon   Pt reports salt keeps his coffee fresh- only uses a pinch   Patient eats at a [employer] on Fridays and Saturdays every week  Patient eats vegetables and protein ie: chicken, [place], fish, lamb   Eats one piece of fried fish, vegetable and starch  Starch Choices: potatoes, rice or a tablespoon of macaroni and cheese  Plan  Continue current medications   CPP will discuss BP monitoring with patient at next office visit  CPA will follow up with patient and determine how he is doing with his modifications in his diet.   Hyperlipidemia   LDL goal < 70   Last lipids Lab Results  Component Value Date   CHOL 122 12/04/2019   HDL 43 12/04/2019   LDLCALC 66 12/04/2019   TRIG 62 12/04/2019   CHOLHDL 2.8 12/04/2019   Hepatic Function Latest Ref Rng & Units 10/07/2020 04/15/2020 03/14/2019  Total Protein 6.0 - 8.5  g/dL 7.6 7.2 7.2  Albumin 3.7 - 4.7 g/dL 4.3 4.2 4.3  AST 0 - 40 IU/L 17 18 35  ALT 0 - 44 IU/L _0 Alk Phosphatase 44 - 121 IU/L 70 68 58  Total Bilirubin 0.0 - 1.2 mg/dL 0.6 0.6 0.7     The ASCVD Risk score Mikey Bussing DC Jr., et al., 2013) failed to calculate for the following reasons:   The valid total cholesterol range is 130 to 320 mg/dL   Patient has failed these meds in past: none noted Patient is currently controlled on the following medications:  . Crestor 10 MG - Take daily  . ASA 81 MG- Take daily   We discussed:  Diet Extensively   Plan  Continue current medications, will collaborate with pcp team on patients use of ASA 81 mg tablet and possible discontinuation due to age and benefit.  Increase dose of statin, Crestor 10 MG tablet once a day to 20 MG tablet once a day based on AHA/ACC guidelines if able tolerate.    Diabetes   A1c goal <8%  Recent Relevant Labs: Lab Results  Component Value Date/Time   HGBA1C 8.8 (H) 10/07/2020 03:52 PM   HGBA1C 7.4 (H) 07/06/2020 04:53 PM   MICROALBUR 30 12/04/2019 04:21 PM   MICROALBUR 30 06/18/2019 04:42 PM    Last diabetic Eye exam:  Lab Results  Component Value Date/Time   HMDIABEYEEXA No Retinopathy 10/29/2019 12:00 AM    Last diabetic Foot exam: No results found for: HMDIABFOOTEX   Checking BG: Daily  Recent FBG Readings: 89-100-115 Recent pre-meal BG readings:  Recent 2hr PP BG readings:   Recent HS BG readings:   Patient has failed these meds in past: Onglyza 2.5 MG Patient is currently uncontrolled on the following medications: . Farxiga 5 MG - Take 1 tablet by mouth daily  . Ozempic 0.25 MG- inject weekly into the skin on Wednesday  o Patient gets his injection at the clinic  We discussed:   Patient reports that he was splurging on carbohydrates prior to his last appointment with Dr. Baird Cancer and that is why he had medication added to his regimen   PAP paperwork completed by patient for Ozempic,  waiting for financial information   Checking his BS  everyday and writing down the readings   Possible CGM candidate  Signs and Symptoms of Hypoglycemia and ways to treat it  Pt has not had any low readings in the past month  Keeps 4 ounce cans of [employer]   Limiting high carbohydrate beverages  Pt likes the peach sweet tea from [employer]  Dilute the sweet tea with water, using a smaller cup   Reports he is now eating more balanced  Increasing his vegetables   Refraining from sweets   Eliminating banana pudding and bread pudding   Pt cutting a small sliver of pound cake for taste  Limiting his carbohydrates to:  Small portion of red potatoes, rice or a tablespoon of macaroni and cheese  Plan  Continue current medications  CPA to follow up with patient next week to assess how he is doing with his BS readings Patient assistance for Farxiga to be prepared for patient to sign  Patient to bring in financial documentation for patient assistance next week on Wednesday  CKD Stage 3a   Lab Results  Component Value Date   CREATININE 1.48 (H) 10/07/2020   BUN 17 10/07/2020   GFRNONAA 45 (L) 10/07/2020   GFRAA 52 (L) 10/07/2020   NA 139 10/07/2020   K 4.8 10/07/2020   CALCIUM 9.4 10/07/2020   CO2 22 10/07/2020    Patient has failed these meds in past:  Patient is currently controlled on the following medications:  . Telmisartan 40 MG- take daily  We will discuss thjs in further detail at next office visit.   Plan  Continue current medications. Continue routine monitoring and dose adjustment of medications as indicated by renal function - no dose adjustments recommended at this time  GOUT   Patient has failed these meds in past: Uloric 40 MG  Patient is currently controlled on the following medications:  . Allopurinol 100 MG - Take 1/2 tablet by mouth everyday  We discussed:    Pt is adherent to medication regimen and takes his medication everyday at night  Pt  has not had a recent gout flare up   Plan Continue current medications   Vaccines   Reviewed and discussed patient's vaccination history.    Immunization History  Administered Date(s) Administered  . Fluad Quad(high Dose 65+) 08/26/2020  . Influenza, High Dose Seasonal PF 07/22/2017  . Influenza,inj,Quad PF,6+ Mos 08/02/2019  . Influenza-Unspecified 08/21/2018  . PFIZER SARS-COV-2 Vaccination 12/10/2019, 12/30/2019, 08/17/2020  . Pneumococcal Conjugate-13 09/11/2019  . Pneumococcal Polysaccharide-23 11/04/2009, 10/04/2016, 12/22/2017  . Zoster Recombinat (Shingrix) 12/22/2017    Plan  Patient has not completed Shingrix 2 part series, will follow up with patient to confirm that he has not finished the series  If not will speak with patient about restarting 2-dose series     Medication Management   Other Medications: Diclofenac Sodium 3% Gel Flax Seed - taking daily FD Donald Prose- taking as needed for bloating Linzess - as needed for constipation  Sildenafil-as needed for erectile dysfunction  Patient's preferred pharmacy is:  [facility] [place] [facility] [address] [place] [address] Phone: [phone] Fax: [phone]  Uses pill box? No - patient keeps medication in bottles  Pt endorses 100% compliance  We discussed: Discussed benefits of medication synchronization, packaging and delivery as well as enhanced pharmacist oversight with [employer].   Medication adherence  Pt takes his medication everyday in the morning.  He has an emergency bottle of medication that he leaves in the car just in case he forgets to take his medication   Recommended that patient not place this bottle in his car and leave it there due to temperature changes and the medication not being properly labeled.   He does not miss his medication Plan  Continue current medication management  strategy    Follow up: 1 month phone visit  Orlando Penner, PharmD Clinical Pharmacist [facility] [phone]

## 2020-11-05 NOTE — Progress Notes (Signed)
I,Tianna Badgett,acting as a Education administrator for [employer], NP.,have documented all relevant documentation on the behalf of [employer], NP,as directed by  Bary Castilla, NP while in the presence of Bary Castilla, NP.  This visit occurred during the SARS-CoV-2 public health emergency.  Safety protocols were in place, including screening questions prior to the visit, additional usage of staff PPE, and extensive cleaning of exam room while observing appropriate contact time as indicated for disinfecting solutions.  Subjective:     Patient ID: Jose Ponce , male    DOB: October 31, 1941 , 79 y.o.   MRN: [id number]   Chief Complaint  Patient presents with  . Cold Extremity    HPI  Patient is here today because he is always cold.  He had a triple bypass 7 years ago. They told him that this may be one of his side-effect. He stays cold all the time.  He is also working on his diet. He does eat in moderation. He drinks peach tea but he dilutes it with water. He recently got his blood work done about a month ago.     Past Medical History:  Diagnosis Date  . Chronic kidney disease   . Coronary artery disease   . Gout   . Hyperlipidemia   . Hypertension   . Type 2 diabetes mellitus (HCC)      Family History  Problem Relation Age of Onset  . Asthma Mother   . Heart disease Father   . Stroke Father   . Heart attack Father 59  . Cancer Sister 1  . Diabetes Brother   . Heart disease Maternal Grandmother      Current Outpatient Medications:  .  allopurinol (ZYLOPRIM) 100 MG tablet, TAKE 1/2 TABLET BY MOUTH EVERY DAY, Disp: 45 tablet, Rfl: 1 .  aspirin 81 MG tablet, Take 81 mg by mouth daily., Disp: , Rfl:  .  Cholecalciferol 125 MCG (5000 UT) CHEW, Chew 5,000 Units by mouth. , Disp: , Rfl:  .  clotrimazole-betamethasone (LOTRISONE) cream, Apply to affected area 2 times daily, Disp: 15 g, Rfl: 1 .  Diclofenac Sodium 3 % GEL, Place onto the skin., Disp: , Rfl:  .  Flaxseed,  Linseed, (FLAX SEEDS PO), Take 1 tablet by mouth daily., Disp: , Rfl:  .  ketoconazole (NIZORAL) 2 % cream, Apply 1 application topically daily., Disp: , Rfl:  .  lidocaine (LIDODERM) 5 %, Place 1 patch onto the skin daily. Remove & Discard patch within 12 hours or as directed by MD Place over left lower back., Disp: 30 patch, Rfl: 0 .  Linaclotide (LINZESS) 290 MCG CAPS capsule, Take 290 mcg by mouth daily., Disp: , Rfl:  .  metoprolol succinate (TOPROL-XL) 50 MG 24 hr tablet, TAKE 1 TABLET BY MOUTH EVERY DAY, Disp: 90 tablet, Rfl: 1 .  rosuvastatin (CRESTOR) 10 MG tablet, TAKE 1 TABLET BY MOUTH EVERY DAY, Disp: 90 tablet, Rfl: 1 .  Semaglutide,0.25 or 0.5MG /DOS, (OZEMPIC, 0.25 OR 0.5 MG/DOSE,) 2 MG/1.5ML SOPN, Inject 0.25 mg into the skin once a week., Disp: 3 pen, Rfl: 3 .  sildenafil (VIAGRA) 50 MG tablet, Take 1 tablet (50 mg total) by mouth daily as needed for erectile dysfunction. (Patient not taking: Reported on 07/06/2020), Disp: 30 tablet, Rfl: 0 .  telmisartan (MICARDIS) 20 MG tablet, TAKE 1 TABLET BY MOUTH EVERY DAY, Disp: 90 tablet, Rfl: 1   No Known Allergies   Review of Systems  Constitutional: Negative.   Respiratory: Negative.  Negative  for cough, shortness of breath and wheezing.   Cardiovascular: Negative.  Negative for chest pain and leg swelling.  Gastrointestinal: Negative.   Endocrine: Positive for cold intolerance.  Neurological: Negative.      Today's Vitals   11/05/20 1051  BP: 132/64  Pulse: 68  Temp: 98.6 F (37 C)  TempSrc: Oral  Weight: 164 lb (74.4 kg)  Height: 5' 7.4" (1.712 m)   Body mass index is 25.38 kg/m.  Wt Readings from Last 3 Encounters:  11/05/20 164 lb (74.4 kg)  10/07/20 168 lb (76.2 kg)  07/06/20 163 lb 6.4 oz (74.1 kg)     Objective:  Physical Exam Cardiovascular:     Rate and Rhythm: Normal rate and regular rhythm.     Pulses: Normal pulses.     Heart sounds: Normal heart sounds.  Pulmonary:     Effort: Pulmonary effort is  normal.     Breath sounds: Normal breath sounds. No wheezing or rhonchi.  Skin:    General: Skin is warm and dry.     Capillary Refill: Capillary refill takes less than 2 seconds.     Coloration: Skin is not cyanotic.  Neurological:     Mental Status: He is alert.  Psychiatric:        Mood and Affect: Mood normal.        Behavior: Behavior normal.        Thought Content: Thought content normal.        Judgment: Judgment normal.         Assessment And Plan:     1. Cold intolerance -Recent blood work on 11/17 showed normal CBC, TSH, Vitamin B-12 levels. Will continue to follow  -Advised patient that this may be due to the aging process and him taking blood thinner (aspirin) which can also lead to cold intolerance.  -Educated patient to stay well hydrated and eat a well balanced diet. Keep his blood sugar within normal range with medication, diet and exercises.  -F/U as needed    Patient was given opportunity to ask questions. Patient verbalized understanding of the plan and was able to repeat key elements of the plan. All questions were answered to their satisfaction.  Bary Castilla, NP   I, Bary Castilla, NP, have reviewed all documentation for this visit. The documentation on 11/05/20 for the exam, diagnosis, procedures, and orders are all accurate and complete.  THE PATIENT IS ENCOURAGED TO PRACTICE SOCIAL DISTANCING DUE TO THE COVID-19 PANDEMIC.

## 2020-11-12 ENCOUNTER — Other Ambulatory Visit: Payer: Self-pay | Admitting: Internal Medicine

## 2020-11-18 NOTE — Patient Instructions (Addendum)
Visit Information  Goals Addressed            This Visit's Progress   . Pharmacy Care Plan       CARE PLAN ENTRY (see longitudinal plan of care for additional care plan information)  Current Barriers:  . Chronic Disease Management support, education, and care coordination needs related to Hypertension, Hyperlipidemia, Diabetes, and Gout   Hypertension BP Readings from Last 3 Encounters:  11/05/20 132/64  10/07/20 136/84  07/06/20 112/66   . Pharmacist Clinical Goal(s): o Over the next 90 days, patient will work with PharmD and providers to maintain BP goal <140/90 . Current regimen:  o Telmisartan 20 MG - Take 1 tablet daily o Metoprolol Succinate 50 MG- Take 1 tablet daily  . Interventions: o Dietary and exercise recommendations provided - Limit salt intake - Increase physical activity to 30 minutes 3 times weekly, with a long term goal of 30 minutes 5 times weekly . Patient self care activities - Over the next 30 days, patient will: o Check BP once a week, document, and provide at future appointments o Ensure daily salt intake < 2300 mg/day  Hyperlipidemia Lab Results  Component Value Date/Time   LDLCALC 66 12/04/2019 05:00 PM   . Pharmacist Clinical Goal(s): o Over the next 90 days, patient will work with PharmD and providers to maintain LDL goal < 70 . Current regimen:  o Crestor 10 MG - Take daily o ASA 81 MG- Take daily . Interventions: o Dietary and exercise recommendations provided - Increase intake of healthy fats (i.e avocados, walnuts, flaxseed) - Decrease intake of foods high in saturated and trans fat (i.e. chips)  . Patient self care activities - Over the next 90 days, patient will: o Work to improve diet, increase heart healthy fats and decrease trans/saturated fats o Try to exercise for 30 minutes daily 5 times per week  Diabetes Lab Results  Component Value Date/Time   HGBA1C 8.8 (H) 10/07/2020 03:52 PM   HGBA1C 7.4 (H) 07/06/2020 04:53 PM    . Pharmacist Clinical Goal(s): o Over the next 90 days, patient will work with PharmD and providers to achieve A1c goal <8% . Current regimen:  o Farxiga 5 MG - take 1 tablet by mouth daily o Ozempic 0.5 MG - inject weekly into the skin on Wednesday  . Interventions: o Advised patient to continue checking blood sugar daily and record o Provided dietary and exercise recommendations o Discussed the importance of diet and exercise to reach hemoglobin A1c goal of less than 7% . Patient self care activities - Over the next 90 days, patient will: o Check blood sugar once daily, document, and provide at future appointments o Contact provider with any episodes of hypoglycemia  GOUT . Pharmacist Clinical Goal(s) o Over the next 90 days, patient will work with PharmD and providers to reduce flare ups . Current regimen:  o Allopurinol 100 MG - take 1/2 tablet by mouth everyday . Interventions: o Will avoid foods that may cause gout symptoms.  . Patient self care activities - Over the next 90 days, patient will: o Limit gout flare ups  Medication management . Pharmacist Clinical Goal(s): o Over the next 90 days, patient will work with PharmD and providers to maintain optimal medication adherence . Current [facility]: [facility] . Interventions o Comprehensive medication review performed. o Continue current medication management strategy . Patient self care activities - Over the next 90 days, patient will: o Focus on medication adherence by taking medication  everyday on a routine basis o Take medications as prescribed o Report any questions or concerns to PharmD and/or provider(s)  Initial goal documentation        Mr. Levandowski was given information about Chronic Care Management services today including:  1. CCM service includes personalized support from designated clinical staff supervised by his physician, including individualized plan of care and coordination with other care  providers 2. 24/7 contact phone numbers for assistance for urgent and routine care needs. 3. Standard insurance, coinsurance, copays and deductibles apply for chronic care management only during months in which we provide at least 20 minutes of these services. Most insurances cover these services at 100%, however patients may be responsible for any copay, coinsurance and/or deductible if applicable. This service may help you avoid the need for more expensive face-to-face services. 4. Only one practitioner may furnish and bill the service in a calendar month. 5. The patient may stop CCM services at any time (effective at the end of the month) by phone call to the office staff.  Patient agreed to services and verbal consent obtained.   The patient verbalized understanding of instructions, educational materials, and care plan provided today and agreed to receive a mailed copy of patient instructions, educational materials, and care plan.  The pharmacy team will reach out to the patient again over the next 30 days.   Mayford Knife, [facility]

## 2020-12-02 ENCOUNTER — Other Ambulatory Visit: Payer: Self-pay | Admitting: Internal Medicine

## 2020-12-16 ENCOUNTER — Ambulatory Visit: Payer: Medicare PPO | Admitting: Internal Medicine

## 2020-12-17 ENCOUNTER — Telehealth: Payer: Self-pay

## 2020-12-17 ENCOUNTER — Telehealth: Payer: Medicare PPO

## 2020-12-17 NOTE — Telephone Encounter (Cosign Needed)
   12/17/2020  Jose Ponce 28-Aug-1941 [id number]    An unsuccessful telephone outreach was attempted today. The patient was referred to the case management team for assistance with care management and care coordination.   Follow Up Plan: Telephone follow up appointment with care management team member scheduled for: 01/25/21  Barb Merino, RN, BSN, CCM Care Management Coordinator [facility]  Direct Phone: [phone]

## 2020-12-23 ENCOUNTER — Telehealth: Payer: Self-pay

## 2020-12-23 NOTE — Telephone Encounter (Signed)
I left the pt a message that his ozempic has been delivered from the [employer] patient assistance program and is ready for pickup.

## 2020-12-29 DIAGNOSIS — C61 Malignant neoplasm of prostate: Secondary | ICD-10-CM | POA: Diagnosis not present

## 2021-01-05 DIAGNOSIS — N5201 Erectile dysfunction due to arterial insufficiency: Secondary | ICD-10-CM | POA: Diagnosis not present

## 2021-01-05 DIAGNOSIS — Z8546 Personal history of malignant neoplasm of prostate: Secondary | ICD-10-CM | POA: Diagnosis not present

## 2021-01-06 ENCOUNTER — Encounter: Payer: Self-pay | Admitting: Internal Medicine

## 2021-01-06 ENCOUNTER — Other Ambulatory Visit: Payer: Self-pay

## 2021-01-06 ENCOUNTER — Ambulatory Visit: Payer: Medicare PPO | Admitting: Internal Medicine

## 2021-01-06 ENCOUNTER — Ambulatory Visit (INDEPENDENT_AMBULATORY_CARE_PROVIDER_SITE_OTHER): Payer: Medicare PPO

## 2021-01-06 VITALS — BP 114/72 | HR 59 | Temp 97.9°F | Ht 67.4 in | Wt 163.4 lb

## 2021-01-06 VITALS — BP 114/72 | HR 59 | Temp 97.9°F | Ht 67.4 in | Wt 163.0 lb

## 2021-01-06 DIAGNOSIS — N183 Chronic kidney disease, stage 3 unspecified: Secondary | ICD-10-CM | POA: Diagnosis not present

## 2021-01-06 DIAGNOSIS — N1831 Chronic kidney disease, stage 3a: Secondary | ICD-10-CM

## 2021-01-06 DIAGNOSIS — E7800 Pure hypercholesterolemia, unspecified: Secondary | ICD-10-CM

## 2021-01-06 DIAGNOSIS — Z0000 Encounter for general adult medical examination without abnormal findings: Secondary | ICD-10-CM

## 2021-01-06 DIAGNOSIS — E1122 Type 2 diabetes mellitus with diabetic chronic kidney disease: Secondary | ICD-10-CM

## 2021-01-06 DIAGNOSIS — I129 Hypertensive chronic kidney disease with stage 1 through stage 4 chronic kidney disease, or unspecified chronic kidney disease: Secondary | ICD-10-CM | POA: Insufficient documentation

## 2021-01-06 DIAGNOSIS — I2510 Atherosclerotic heart disease of native coronary artery without angina pectoris: Secondary | ICD-10-CM | POA: Diagnosis not present

## 2021-01-06 DIAGNOSIS — I1310 Hypertensive heart and chronic kidney disease without heart failure, with stage 1 through stage 4 chronic kidney disease, or unspecified chronic kidney disease: Secondary | ICD-10-CM | POA: Diagnosis not present

## 2021-01-06 LAB — POCT URINALYSIS DIPSTICK
Bilirubin, UA: NEGATIVE
Blood, UA: NEGATIVE
Glucose, UA: POSITIVE — AB
Ketones, UA: NEGATIVE
Leukocytes, UA: NEGATIVE
Nitrite, UA: NEGATIVE
Protein, UA: NEGATIVE
Spec Grav, UA: 1.02 (ref 1.010–1.025)
Urobilinogen, UA: 0.2 E.U./dL
pH, UA: 5.5 (ref 5.0–8.0)

## 2021-01-06 LAB — POCT UA - MICROALBUMIN
Albumin/Creatinine Ratio, Urine, POC: <30
Creatinine, POC: 100 mg/dL
Microalbumin Ur, POC: 10 mg/L

## 2021-01-06 NOTE — Progress Notes (Signed)
I,[name],acting as a Education administrator for Jose Greenland, MD.,have documented all relevant documentation on the behalf of Jose Greenland, MD,as directed by  Jose Greenland, MD while in the presence of Jose Greenland, MD.  This visit occurred during the SARS-CoV-2 public health emergency.  Safety protocols were in place, including screening questions prior to the visit, additional usage of staff PPE, and extensive cleaning of exam room while observing appropriate contact time as indicated for disinfecting solutions.  Subjective:     Patient ID: Jose Ponce , male    DOB: 1941-08-21 , 80 y.o.   MRN: [id number]   Chief Complaint  Patient presents with  . Diabetes  . Hypertension    HPI  Pt is here today for diabetes, and blood pressure check. He reports compliance with meds. He denies having any issues with the medication.  He is also scheduled for AWV with [facility] Advisor.   Diabetes He presents for his follow-up diabetic visit. He has type 2 diabetes mellitus. His disease course has been stable. There are no hypoglycemic associated symptoms. Pertinent negatives for hypoglycemia include no dizziness or headaches. Pertinent negatives for diabetes include no blurred vision, no fatigue, no polydipsia, no polyphagia and no polyuria. There are no hypoglycemic complications. Diabetic complications include nephropathy. Risk factors for coronary artery disease include diabetes mellitus, dyslipidemia, hypertension and male sex. He is following a diabetic diet. He participates in exercise three times a week. An ACE inhibitor/angiotensin II receptor blocker is being taken.  Hypertension This is a chronic problem. The current episode started more than 1 year ago. The problem has been gradually improving since onset. Pertinent negatives include no blurred vision or headaches. The current treatment provides moderate improvement. Compliance problems include exercise.  Hypertensive end-organ damage includes  kidney disease and CAD/MI.     Past Medical History:  Diagnosis Date  . Chronic kidney disease   . Coronary artery disease   . Gout   . Hyperlipidemia   . Hypertension   . Type 2 diabetes mellitus (HCC)      Family History  Problem Relation Age of Onset  . Asthma Mother   . Heart disease Father   . Stroke Father   . Heart attack Father 52  . Cancer Sister 11  . Diabetes Brother   . Heart disease Maternal Grandmother      Current Outpatient Medications:  .  allopurinol (ZYLOPRIM) 100 MG tablet, TAKE 1/2 TABLET BY MOUTH EVERY DAY, Disp: 45 tablet, Rfl: 1 .  aspirin 81 MG tablet, Take 81 mg by mouth daily., Disp: , Rfl:  .  Cholecalciferol 125 MCG (5000 UT) CHEW, Chew 5,000 Units by mouth. , Disp: , Rfl:  .  dapagliflozin propanediol (FARXIGA) 5 MG TABS tablet, Take by mouth daily., Disp: , Rfl:  .  Diclofenac Sodium 3 % GEL, Place onto the skin., Disp: , Rfl:  .  Flaxseed, Linseed, (FLAX SEEDS PO), Take 1 tablet by mouth daily., Disp: , Rfl:  .  ketoconazole (NIZORAL) 2 % cream, Apply 1 application topically daily., Disp: , Rfl:  .  lidocaine (LIDODERM) 5 %, Place 1 patch onto the skin daily. Remove & Discard patch within 12 hours or as directed by MD Place over left lower back., Disp: 30 patch, Rfl: 0 .  Linaclotide (LINZESS) 290 MCG CAPS capsule, Take 290 mcg by mouth daily., Disp: , Rfl:  .  metoprolol succinate (TOPROL-XL) 50 MG 24 hr tablet, TAKE 1 TABLET BY MOUTH EVERY DAY,  Disp: 90 tablet, Rfl: 1 .  rosuvastatin (CRESTOR) 10 MG tablet, TAKE 1 TABLET BY MOUTH EVERY DAY, Disp: 90 tablet, Rfl: 1 .  Semaglutide,0.25 or 0.5MG /DOS, (OZEMPIC, 0.25 OR 0.5 MG/DOSE,) 2 MG/1.5ML SOPN, Inject 0.25 mg into the skin once a week., Disp: 3 pen, Rfl: 3 .  sildenafil (VIAGRA) 50 MG tablet, Take 1 tablet (50 mg total) by mouth daily as needed for erectile dysfunction., Disp: 30 tablet, Rfl: 0 .  telmisartan (MICARDIS) 20 MG tablet, TAKE 1 TABLET BY MOUTH EVERY DAY, Disp: 90 tablet, Rfl: 1    No Known Allergies   Review of Systems  Constitutional: Negative.  Negative for fatigue.  Eyes: Negative for blurred vision.  Respiratory: Negative.   Cardiovascular: Negative.   Gastrointestinal: Negative.   Endocrine: Negative for polydipsia, polyphagia and polyuria.  Neurological: Negative for dizziness and headaches.  Psychiatric/Behavioral: Negative.   All other systems reviewed and are negative.    Today's Vitals   01/06/21 1441  BP: 114/72  Pulse: (!) 59  Temp: 97.9 F (36.6 C)  TempSrc: Oral  Weight: 163 lb 6.4 oz (74.1 kg)  Height: 5' 7.4" (1.712 m)  PainSc: 0-No pain   Body mass index is 25.29 kg/m.  Wt Readings from Last 3 Encounters:  01/06/21 163 lb (73.9 kg)  01/06/21 163 lb 6.4 oz (74.1 kg)  11/05/20 164 lb (74.4 kg)   Objective:  Physical Exam Vitals and nursing note reviewed.  Constitutional:      Appearance: Normal appearance. He is obese.  HENT:     Head: Normocephalic and atraumatic.  Cardiovascular:     Rate and Rhythm: Normal rate and regular rhythm.     Heart sounds: Normal heart sounds.  Pulmonary:     Breath sounds: Normal breath sounds.  Skin:    General: Skin is warm.  Neurological:     General: No focal deficit present.     Mental Status: He is alert and oriented to person, place, and time.         Assessment And Plan:     1. Type 2 diabetes mellitus with stage 3a chronic kidney disease, without long-term current use of insulin (HCC) Comments: Chronic, I will check labs as listed below. Importance of dietary compliance was discussed with the patient. - BMP8+EGFR - Hemoglobin A1c  2. Hypertensive heart and renal disease with renal failure, stage 1 through stage 4 or unspecified chronic kidney disease, without heart failure Comments: Chronic, well controlled. I will check renal function today. Encouraged to follow low sodium diet.   3. Coronary artery disease involving native coronary artery of native heart without angina  pectoris Comments: Chronic. He is s/p CABG. He is encouraged to follow heart healthy lifestyle.   4. Pure hypercholesterolemia Comments: Chronic. Due to h/o DM and CAD, importance of statin compliance was discussed with the patient. Previous LDL reviewed, he is at goal and will c/w rosuvastatin.   Patient was given opportunity to ask questions. Patient verbalized understanding of the plan and was able to repeat key elements of the plan. All questions were answered to their satisfaction.   I, Jose Greenland, MD, have reviewed all documentation for this visit. The documentation on 01/06/21 for the exam, diagnosis, procedures, and orders are all accurate and complete.  THE PATIENT IS ENCOURAGED TO PRACTICE SOCIAL DISTANCING DUE TO THE COVID-19 PANDEMIC.

## 2021-01-06 NOTE — Progress Notes (Signed)
This visit occurred during the SARS-CoV-2 public health emergency.  Safety protocols were in place, including screening questions prior to the visit, additional usage of staff PPE, and extensive cleaning of exam room while observing appropriate contact time as indicated for disinfecting solutions.  Subjective:   Jose Ponce is a 80 y.o. male who presents for Medicare Annual/Subsequent preventive examination.  Review of Systems     Cardiac Risk Factors include: advanced age (>28men, >45 women);diabetes mellitus;hypertension;male gender;sedentary lifestyle     Objective:    Today's Vitals   01/06/21 1523  BP: 114/72  Pulse: (!) 59  Temp: 97.9 F (36.6 C)  TempSrc: Oral  Weight: 163 lb (73.9 kg)  Height: 5' 7.4" (1.712 m)   Body mass index is 25.23 kg/m.  Advanced Directives 01/06/2021 12/04/2019 06/18/2019 11/08/2018 10/09/2018 12/02/2014  Does Patient Have a Medical Advance Directive? No Yes Yes Yes No No  Type of Advance Directive - [place];Living will [place];Living will Living will - -  Does patient want to make changes to medical advance directive? - - No - Patient declined - - -  Copy of [place] in Chart? - No - copy requested No - copy requested - - -  Would patient like information on creating a medical advance directive? No - Patient declined - - - No - Patient declined No - patient declined information    Current Medications (verified) Outpatient Encounter Medications as of 01/06/2021  Medication Sig  . allopurinol (ZYLOPRIM) 100 MG tablet TAKE 1/2 TABLET BY MOUTH EVERY DAY  . aspirin 81 MG tablet Take 81 mg by mouth daily.  . Cholecalciferol 125 MCG (5000 UT) CHEW Chew 5,000 Units by mouth.   . Diclofenac Sodium 3 % GEL Place onto the skin.  . Flaxseed, Linseed, (FLAX SEEDS PO) Take 1 tablet by mouth daily.  [name] ketoconazole (NIZORAL) 2 % cream Apply 1 application topically daily.  [name] lidocaine (LIDODERM) 5  % Place 1 patch onto the skin daily. Remove & Discard patch within 12 hours or as directed by MD Place over left lower back.  . Linaclotide (LINZESS) 290 MCG CAPS capsule Take 290 mcg by mouth daily.  . metoprolol succinate (TOPROL-XL) 50 MG 24 hr tablet TAKE 1 TABLET BY MOUTH EVERY DAY  . rosuvastatin (CRESTOR) 10 MG tablet TAKE 1 TABLET BY MOUTH EVERY DAY  . Semaglutide,0.25 or 0.5MG /DOS, (OZEMPIC, 0.25 OR 0.5 MG/DOSE,) 2 MG/1.5ML SOPN Inject 0.25 mg into the skin once a week.  . sildenafil (VIAGRA) 50 MG tablet Take 1 tablet (50 mg total) by mouth daily as needed for erectile dysfunction.  [name] telmisartan (MICARDIS) 20 MG tablet TAKE 1 TABLET BY MOUTH EVERY DAY   No facility-administered encounter medications on file as of 01/06/2021.    Allergies (verified) Patient has no known allergies.   History: Past Medical History:  Diagnosis Date  . Chronic kidney disease   . Coronary artery disease   . Gout   . Hyperlipidemia   . Hypertension   . Type 2 diabetes mellitus ([place])    Past Surgical History:  Procedure Laterality Date  . CARDIAC CATHETERIZATION  01/16/2004   multi-vessel coronary disease 40-50% Left main, 100% mid LAD75-80% ostial stenosis  . CARDIOVASCULAR STRESS TEST  08/09/2004   post CABG, low risk scan  . CARDIOVASCULAR STRESS TEST  10/23/2003   mild septal ischemia, EF43%,   . CORONARY ARTERY BYPASS GRAFT  02/05/2004   CABGx3 Dr.Bartle  . Lower  Ext. Doppler  10/20/2011   Right Anterior Tibial: occlusive disease, Left Anterior Tibial occluded w/reconstitution at ankle, mildly abnormal LEA  . NM MYOCAR PERF WALL MOTION  06/07/2011   protocol:Persantine, EF60%, negative for ischemia, low risk scan  . NM MYOCAR PERF WALL MOTION  08/05/2009   protocol: Persantine, EF57%. no evidence of ischemia, low risk scan.   . TRANSPERINEAL IMPLANT OF RADIATION SEEDS W/ ULTRASOUND     Family History  Problem Relation Age of Onset  . Asthma Mother   . Heart disease Father   .  Stroke Father   . Heart attack Father 66  . Cancer Sister 64  . Diabetes Brother   . Heart disease Maternal Grandmother    Social History   Socioeconomic History  . Marital status: Divorced    Spouse name: Not on file  . Number of children: Not on file  . Years of education: Not on file  . Highest education level: Not on file  Occupational History  . Occupation: retired  Tobacco Use  . Smoking status: Former Smoker    Packs/day: 0.25    Years: 20.00    Pack years: 5.00    Types: Cigarettes    Quit date: 11/21/1984    Years since quitting: 36.1  . Smokeless tobacco: Never Used  Vaping Use  . Vaping Use: Never used  Substance and Sexual Activity  . Alcohol use: Not Currently  . Drug use: Not Currently  . Sexual activity: Yes  Other Topics Concern  . Not on file  Social History Narrative  . Not on file   Social Determinants of Health   Financial Resource Strain: Low Risk   . Difficulty of Paying Living Expenses: Not hard at all  Food Insecurity: No Food Insecurity  . Worried About Charity fundraiser in the Last Year: Never true  . Ran Out of Food in the Last Year: Never true  Transportation Needs: No Transportation Needs  . Lack of Transportation (Medical): No  . Lack of Transportation (Non-Medical): No  Physical Activity: Insufficiently Active  . Days of Exercise per Week: 2 days  . Minutes of Exercise per Session: 20 min  Stress: No Stress Concern Present  . Feeling of Stress : Not at all  Social Connections: Not on file    Tobacco Counseling Counseling given: Not Answered   Clinical Intake:  Pre-visit preparation completed: Yes  Pain : No/denies pain     Nutritional Status: BMI 25 -29 Overweight Nutritional Risks: None Diabetes: Yes  How often do you need to have someone help you when you read instructions, pamphlets, or other written materials from your doctor or pharmacy?: 1 - Never What is the last grade level you completed in school?: post  graduate  Diabetic? Yes Nutrition Risk Assessment:  Has the patient had any N/V/D within the last 2 months?  No  Does the patient have any non-healing wounds?  No  Has the patient had any unintentional weight loss or weight gain?  No   Diabetes:  Is the patient diabetic?  Yes  If diabetic, was a CBG obtained today?  No  Did the patient bring in their glucometer from home?  No  How often do you monitor your CBG's? daily.   Financial Strains and Diabetes Management:  Are you having any financial strains with the device, your supplies or your medication? No .  Does the patient want to be seen by Chronic Care Management for management of their diabetes?  No  Would the patient like to be referred to a Nutritionist or for Diabetic Management?  No   Diabetic Exams:  Diabetic Eye Exam: Completed 2021 Diabetic Foot Exam: Completed today   Interpreter Needed?: No  Information entered by :: NAllen LPN   Activities of Daily Living In your present state of health, do you have any difficulty performing the following activities: 01/06/2021 01/06/2021  Hearing? N N  Vision? N N  Difficulty concentrating or making decisions? N N  Walking or climbing stairs? N N  Dressing or bathing? N N  Doing errands, shopping? N N  Preparing Food and eating ? N -  Using the Toilet? N -  In the past six months, have you accidently leaked urine? N -  Do you have problems with loss of bowel control? N -  Managing your Medications? N -  Managing your Finances? N -  Housekeeping or managing your Housekeeping? N -  Some recent data might be hidden    Patient Care Team: Glendale Chard, MD as PCP - General (Internal Medicine) Lorretta Harp, MD as PCP - Cardiology (Cardiology) Rex Kras, Claudette Stapler, RN as Case Manager Dianah Field Sharyn Blitz, [facility] as Pharmacist (Pharmacist)  Indicate any recent Medical Services you may have received from other than [facility] providers in the past year (date may be approximate).      Assessment:   This is a routine wellness examination for Isa[place].  Hearing/Vision screen No exam data present  Dietary issues and exercise activities discussed: Current Exercise Habits: Home exercise routine, Time (Minutes): 20, Frequency (Times/Week): 2, Weekly Exercise (Minutes/Week): 40  Goals    .  "I would like to learn more about portion control for my diabetes" (pt-stated)      Current Barriers:  [name] Knowledge Deficits related to disease process and Self Health Management of Diabetes . Chronic Disease Management support and education needs related to DMT2, HTN, HLD  Nurse Case Manager Clinical Goal(s):  . 07/03/20 New Over the next 90 days, patient will continue working with the CCM team and PCP for disease education and support for improved Self Health management of DM  CCM RN CM  Interventions:  10/13/20 call completed with patient . Evaluation of current treatment plan related to diabetes and patient's adherence to plan as established by provider . Provided education to patient re: current A1c is elevated to 8.8 from 7.9; Educated on target A1c of <7.0; Re-educated on daily glycemic control FBS 80-130, <180 after meals; Re-educated dietary and exercise recommendations . Determined patient admits he is drinking more sugary drinks but has since cut back on these types of foods  . Reviewed medications with patient and discussed current use of Ozempic 0.25 mg weekly injection, pt reports adherence w/o noted SE  . Advised patient, providing education and rationale, to check cbg before meals and record, calling the CCM team and or PCP provider for findings outside established parameters . Discussed Dr. Baird Cancer is considering adding an additional antihyperglycemic to his current regimen . Collaborated with Dr. Baird Cancer to determine she is consider adding insulin or [place]; Pharm D referral sent for further education and assistance  . Discussed plans with patient for ongoing care  management follow up and provided patient with direct contact information for care management team  Patient Self Care Activities:  . Self administers medications as prescribed . Attends all scheduled provider appointments . Calls pharmacy for medication refills . Performs ADL's independently . Performs IADL's independently . Calls provider office for  new concerns or questions  Please see past updates related to this goal by clicking on the "Past Updates" button in the selected goal      .  "to improve my kidney function" (pt-stated)      CARE PLAN ENTRY (see longitudinal plan of care for additional care plan information)  Current Barriers:  [name] Knowledge Deficits related to disease education and support to improve  . Chronic Disease Management support and education needs related to DMT2, HTN, HLD, Chronic Renal Disease stage IIIb  Nurse Case Manager Clinical Goal(s):  [name] Over the next 90 days, patient will work with the CCM team and PCP to address needs related to disease education and support for improved Self Health management of stage IIIb CKD  CCM RN CM Interventions:  10/13/21 call completed with patient  . Inter-disciplinary care team collaboration (see longitudinal plan of care) . Evaluation of current treatment plan related to stage III CKD and patient's adherence to plan as established by provider. . Provided education to patient re: current GFR 45; Educated on stages of CKD; Educated on dietary recommendations to improve kidney function; Educated on importance of drinking plenty of water to help improve renal function . Discussed plans with patient for ongoing care management follow up and provided patient with direct contact information for care management team . Provided patient with printed educational materials related to Eating Right for Kidney Disease; The Best Salt Substitute for Kidney Patients; Stages of Chronic Kidney Disease; Eating Right for Chronic Kidney Disease    Patient Self Care Activities:  . Self administers medications as prescribed . Attend all scheduled provider appointments . Call pharmacy for medication refills . Call provider office for new concerns or questions  Initial goal documentation     .  DIET - INCREASE WATER INTAKE (pt-stated)    .  Exercise 150 min/wk Moderate Activity (pt-stated)    .  Exercise 150 min/wk Moderate Activity      12/04/2019, wants to exercise more, decrease sweet intake, drink more water    .  Patient Stated      01/06/2021, wants to exercise more    .  Pharmacy Care Plan      CARE PLAN ENTRY (see longitudinal plan of care for additional care plan information)  Current Barriers:  . Chronic Disease Management support, education, and care coordination needs related to Hypertension, Hyperlipidemia, Diabetes, and Gout   Hypertension BP Readings from Last 3 Encounters:  11/05/20 132/64  10/07/20 136/84  07/06/20 112/66   . Pharmacist Clinical Goal(s): o Over the next 90 days, patient will work with PharmD and providers to maintain BP goal <140/90 . Current regimen:  o Telmisartan 20 MG - Take 1 tablet daily o Metoprolol Succinate 50 MG- Take 1 tablet daily  . Interventions: o Dietary and exercise recommendations provided - Limit salt intake - Increase physical activity to 30 minutes 3 times weekly, with a long term goal of 30 minutes 5 times weekly . Patient self care activities - Over the next 30 days, patient will: o Check BP once a week, document, and provide at future appointments o Ensure daily salt intake < 2300 mg/day  Hyperlipidemia Lab Results  Component Value Date/Time   LDLCALC 66 12/04/2019 05:00 PM   . Pharmacist Clinical Goal(s): o Over the next 90 days, patient will work with PharmD and providers to maintain LDL goal < 70 . Current regimen:  o Crestor 10 MG - Take daily o ASA 81 MG- Take daily .  Interventions: o Dietary and exercise recommendations provided - Increase  intake of healthy fats (i.e avocados, walnuts, flaxseed) - Decrease intake of foods high in saturated and trans fat (i.e. chips)  . Patient self care activities - Over the next 90 days, patient will: o Work to improve diet, increase heart healthy fats and decrease trans/saturated fats o Try to exercise for 30 minutes daily 5 times per week  Diabetes Lab Results  Component Value Date/Time   HGBA1C 8.8 (H) 10/07/2020 03:52 PM   HGBA1C 7.4 (H) 07/06/2020 04:53 PM   . Pharmacist Clinical Goal(s): o Over the next 90 days, patient will work with PharmD and providers to achieve A1c goal <8% . Current regimen:  o Farxiga 5 MG - take 1 tablet by mouth daily o Ozempic 0.5 MG - inject weekly into the skin on Wednesday  . Interventions: o Advised patient to continue checking blood sugar daily and record o Provided dietary and exercise recommendations o Discussed the importance of diet and exercise to reach hemoglobin A1c goal of less than 7% . Patient self care activities - Over the next 90 days, patient will: o Check blood sugar once daily, document, and provide at future appointments o Contact provider with any episodes of hypoglycemia  GOUT . Pharmacist Clinical Goal(s) o Over the next 90 days, patient will work with PharmD and providers to reduce flare ups . Current regimen:  o Allopurinol 100 MG - take 1/2 tablet by mouth everyday . Interventions: o Will avoid foods that may cause gout symptoms.  . Patient self care activities - Over the next 90 days, patient will: o Limit gout flare ups  Medication management . Pharmacist Clinical Goal(s): o Over the next 90 days, patient will work with PharmD and providers to maintain optimal medication adherence . [facility] . Interventions o Comprehensive medication review performed. o Continue current medication management strategy . Patient self care activities - Over the next 90 days, patient will: o Focus on medication  adherence by taking medication everyday on a routine basis o Take medications as prescribed o Report any questions or concerns to PharmD and/or provider(s)  Initial goal documentation       Depression Screen PHQ 2/9 Scores 01/06/2021 01/06/2021 12/04/2019 06/18/2019 03/14/2019 11/08/2018 10/10/2018  PHQ - 2 Score 0 0 0 0 0 0 0  PHQ- 9 Score - - 0 0 - 0 -    Fall Risk Fall Risk  01/06/2021 01/06/2021 12/04/2019 06/18/2019 03/14/2019  Falls in the past year? 0 0 0 0 0  Risk for fall due to : Medication side effect - Medication side effect Medication side effect -  Follow up Falls evaluation completed;Education provided;Falls prevention discussed - Falls evaluation completed;Education provided;Falls prevention discussed Falls evaluation completed;Education provided;Falls prevention discussed -    FALL RISK PREVENTION PERTAINING TO THE HOME:  Any stairs in or around the home? Yes  If so, are there any without handrails? No  Home free of loose throw rugs in walkways, pet beds, electrical cords, etc? Yes  Adequate lighting in your home to reduce risk of falls? Yes   ASSISTIVE DEVICES UTILIZED TO PREVENT FALLS:  Life alert? No  Use of a cane, walker or w/c? No  Grab bars in the bathroom? No  Shower chair or bench in shower? No  Elevated toilet seat or a handicapped toilet? Yes   TIMED UP AND GO:  Was the test performed? No .    Gait steady and fast without use of  assistive device  Cognitive Function:     6CIT Screen 12/04/2019 06/18/2019 11/08/2018  What Year? 0 points 0 points 0 points  What month? 0 points 0 points 0 points  What time? 0 points 0 points 0 points  Count back from 20 0 points 0 points 0 points  Months in reverse 0 points 0 points 0 points  Repeat phrase 4 points 0 points 0 points  Total Score 4 0 0    Immunizations Immunization History  Administered Date(s) Administered  . Fluad Quad(high Dose 65+) 08/26/2020  . Influenza, High Dose Seasonal PF 07/22/2017  .  Influenza,inj,Quad PF,6+ Mos 08/02/2019  . Influenza-Unspecified 08/21/2018  . PFIZER(Purple Top)SARS-COV-2 Vaccination 12/10/2019, 12/30/2019, 08/17/2020  . Pneumococcal Conjugate-13 09/11/2019  . Pneumococcal Polysaccharide-23 11/04/2009, 10/04/2016, 12/22/2017  . Zoster Recombinat (Shingrix) 12/22/2017    TDAP status: Up to date  Flu Vaccine status: Up to date  Pneumococcal vaccine status: Up to date  Covid-19 vaccine status: Completed vaccines  Qualifies for Shingles Vaccine? Yes   Zostavax completed No   Shingrix Completed?: Yes  Screening Tests Health Maintenance  Topic Date Due  . OPHTHALMOLOGY EXAM  10/28/2020  . FOOT EXAM  12/03/2020  . HEMOGLOBIN A1C  04/06/2021  . TETANUS/TDAP  02/28/2022  . INFLUENZA VACCINE  Completed  . COVID-19 Vaccine  Completed  . Hepatitis C Screening  Completed  . PNA vac Low Risk Adult  Completed    Health Maintenance  Health Maintenance Due  Topic Date Due  . OPHTHALMOLOGY EXAM  10/28/2020  . FOOT EXAM  12/03/2020    Colorectal cancer screening: No longer required.   Lung Cancer Screening: (Low Dose CT Chest recommended if Age 45-80 years, 30 pack-year currently smoking OR have quit w/in 15years.) does not qualify.   Lung Cancer Screening Referral: no  Additional Screening:  Hepatitis C Screening: does qualify; Completed 07/06/2020   Vision Screening: Recommended annual ophthalmology exams for early detection of glaucoma and other disorders of the eye. Is the patient up to date with their annual eye exam?  Yes  Who is the provider or what is the name of the office in which the patient attends annual eye exams? Dr. Venetia Maxon If pt is not established with a provider, would they like to be referred to a provider to establish care? No .   Dental Screening: Recommended annual dental exams for proper oral hygiene  Community Resource Referral / Chronic Care Management: CRR required this visit?  No   CCM required this visit?   No      Plan:     I have personally reviewed and noted the following in the patient's chart:   . Medical and social history . Use of alcohol, tobacco or illicit drugs  . Current medications and supplements . Functional ability and status . Nutritional status . Physical activity . Advanced directives . List of other physicians . Hospitalizations, surgeries, and ER visits in previous 12 months . Vitals . Screenings to include cognitive, depression, and falls . Referrals and appointments  In addition, I have reviewed and discussed with patient certain preventive protocols, quality metrics, and best practice recommendations. A written personalized care plan for preventive services as well as general preventive health recommendations were provided to patient.     Kellie Simmering, LPN   12/23/5425   Nurse Notes:

## 2021-01-06 NOTE — Addendum Note (Signed)
Addended by: Kellie Simmering on: 01/06/2021 04:11 PM   Modules accepted: Orders

## 2021-01-06 NOTE — Patient Instructions (Signed)
Jose Ponce , Thank you for taking time to come for your Medicare Wellness Visit. I appreciate your ongoing commitment to your health goals. Please review the following plan we discussed and let me know if I can assist you in the future.   Screening recommendations/referrals: Colonoscopy: not required Recommended yearly ophthalmology/optometry visit for glaucoma screening and checkup Recommended yearly dental visit for hygiene and checkup  Vaccinations: Influenza vaccine: completed 08/26/2020, due 06/21/2021 Pneumococcal vaccine: completed 09/11/2019 Tdap vaccine: completed 02/29/2012, due 02/28/2022 Shingles vaccine: completed Covid-19: 08/17/2020, 12/30/2019, 12/10/2019  Advanced directives: Advance directive discussed with you today. Even though you declined this today please call our office should you change your mind and we can give you the proper paperwork for you to fill out.  Conditions/risks identified: none  Next appointment: Follow up in one year for your annual wellness visit.   Preventive Care 45 Years and Older, Male Preventive care refers to lifestyle choices and visits with your health care provider that can promote health and wellness. What does preventive care include?  A yearly physical exam. This is also called an annual well check.  Dental exams once or twice a year.  Routine eye exams. Ask your health care provider how often you should have your eyes checked.  Personal lifestyle choices, including:  Daily care of your teeth and gums.  Regular physical activity.  Eating a healthy diet.  Avoiding tobacco and drug use.  Limiting alcohol use.  Practicing safe sex.  Taking low doses of aspirin every day.  Taking vitamin and mineral supplements as recommended by your health care provider. What happens during an annual well check? The services and screenings done by your health care provider during your annual well check will depend on your age, overall health,  lifestyle risk factors, and family history of disease. Counseling  Your health care provider may ask you questions about your:  Alcohol use.  Tobacco use.  Drug use.  Emotional well-being.  Home and relationship well-being.  Sexual activity.  Eating habits.  History of falls.  Memory and ability to understand (cognition).  Work and work Statistician. Screening  You may have the following tests or measurements:  Height, weight, and BMI.  Blood pressure.  Lipid and cholesterol levels. These may be checked every 5 years, or more frequently if you are over 62 years old.  Skin check.  Lung cancer screening. You may have this screening every year starting at age 44 if you have a 30-pack-year history of smoking and currently smoke or have quit within the past 15 years.  Fecal occult blood test (FOBT) of the stool. You may have this test every year starting at age 42.  Flexible sigmoidoscopy or colonoscopy. You may have a sigmoidoscopy every 5 years or a colonoscopy every 10 years starting at age 40.  Prostate cancer screening. Recommendations will vary depending on your family history and other risks.  Hepatitis C blood test.  Hepatitis B blood test.  Sexually transmitted disease (STD) testing.  Diabetes screening. This is done by checking your blood sugar (glucose) after you have not eaten for a while (fasting). You may have this done every 1-3 years.  Abdominal aortic aneurysm (AAA) screening. You may need this if you are a current or former smoker.  Osteoporosis. You may be screened starting at age 31 if you are at high risk. Talk with your health care provider about your test results, treatment options, and if necessary, the need for more tests. Vaccines  Your  health care provider may recommend certain vaccines, such as:  Influenza vaccine. This is recommended every year.  Tetanus, diphtheria, and acellular pertussis (Tdap, Td) vaccine. You may need a Td booster  every 10 years.  Zoster vaccine. You may need this after age 40.  Pneumococcal 13-valent conjugate (PCV13) vaccine. One dose is recommended after age 13.  Pneumococcal polysaccharide (PPSV23) vaccine. One dose is recommended after age 36. Talk to your health care provider about which screenings and vaccines you need and how often you need them. This information is not intended to replace advice given to you by your health care provider. Make sure you discuss any questions you have with your health care provider. Document Released: 12/04/2015 Document Revised: 07/27/2016 Document Reviewed: 09/08/2015 Elsevier Interactive Patient Education  2017 [employer] Prevention in the Home Falls can cause injuries. They can happen to people of all ages. There are many things you can do to make your home safe and to help prevent falls. What can I do on the outside of my home?  Regularly fix the edges of walkways and driveways and fix any cracks.  Remove anything that might make you trip as you walk through a door, such as a raised step or threshold.  Trim any bushes or trees on the path to your home.  Use bright outdoor lighting.  Clear any walking paths of anything that might make someone trip, such as rocks or tools.  Regularly check to see if handrails are loose or broken. Make sure that both sides of any steps have handrails.  Any raised decks and porches should have guardrails on the edges.  Have any leaves, snow, or ice cleared regularly.  Use sand or salt on walking paths during winter.  Clean up any spills in your garage right away. This includes oil or grease spills. What can I do in the bathroom?  Use night lights.  Install grab bars by the toilet and in the tub and shower. Do not use towel bars as grab bars.  Use non-skid mats or decals in the tub or shower.  If you need to sit down in the shower, use a plastic, non-slip stool.  Keep the floor dry. Clean up any  water that spills on the floor as soon as it happens.  Remove soap buildup in the tub or shower regularly.  Attach bath mats securely with double-sided non-slip rug tape.  Do not have throw rugs and other things on the floor that can make you trip. What can I do in the bedroom?  Use night lights.  Make sure that you have a light by your bed that is easy to reach.  Do not use any sheets or blankets that are too big for your bed. They should not hang down onto the floor.  Have a firm chair that has side arms. You can use this for support while you get dressed.  Do not have throw rugs and other things on the floor that can make you trip. What can I do in the kitchen?  Clean up any spills right away.  Avoid walking on wet floors.  Keep items that you use a lot in easy-to-reach places.  If you need to reach something above you, use a strong step stool that has a grab bar.  Keep electrical cords out of the way.  Do not use floor polish or wax that makes floors slippery. If you must use wax, use non-skid floor wax.  Do not  have throw rugs and other things on the floor that can make you trip. What can I do with my stairs?  Do not leave any items on the stairs.  Make sure that there are handrails on both sides of the stairs and use them. Fix handrails that are broken or loose. Make sure that handrails are as long as the stairways.  Check any carpeting to make sure that it is firmly attached to the stairs. Fix any carpet that is loose or worn.  Avoid having throw rugs at the top or bottom of the stairs. If you do have throw rugs, attach them to the floor with carpet tape.  Make sure that you have a light switch at the top of the stairs and the bottom of the stairs. If you do not have them, ask someone to add them for you. What else can I do to help prevent falls?  Wear shoes that:  Do not have high heels.  Have rubber bottoms.  Are comfortable and fit you well.  Are closed  at the toe. Do not wear sandals.  If you use a stepladder:  Make sure that it is fully opened. Do not climb a closed stepladder.  Make sure that both sides of the stepladder are locked into place.  Ask someone to hold it for you, if possible.  Clearly mark and make sure that you can see:  Any grab bars or handrails.  First and last steps.  Where the edge of each step is.  Use tools that help you move around (mobility aids) if they are needed. These include:  Canes.  Walkers.  Scooters.  Crutches.  Turn on the lights when you go into a dark area. Replace any light bulbs as soon as they burn out.  Set up your furniture so you have a clear path. Avoid moving your furniture around.  If any of your floors are uneven, fix them.  If there are any pets around you, be aware of where they are.  Review your medicines with your doctor. Some medicines can make you feel dizzy. This can increase your chance of falling. Ask your doctor what other things that you can do to help prevent falls. This information is not intended to replace advice given to you by your health care provider. Make sure you discuss any questions you have with your health care provider. Document Released: 09/03/2009 Document Revised: 04/14/2016 Document Reviewed: 12/12/2014 Elsevier Interactive Patient Education  2017 [employer].

## 2021-01-10 DIAGNOSIS — N1831 Chronic kidney disease, stage 3a: Secondary | ICD-10-CM | POA: Insufficient documentation

## 2021-01-13 ENCOUNTER — Ambulatory Visit: Payer: Medicare PPO | Admitting: Internal Medicine

## 2021-01-13 DIAGNOSIS — E1122 Type 2 diabetes mellitus with diabetic chronic kidney disease: Secondary | ICD-10-CM | POA: Diagnosis not present

## 2021-01-13 DIAGNOSIS — N1831 Chronic kidney disease, stage 3a: Secondary | ICD-10-CM | POA: Diagnosis not present

## 2021-01-14 LAB — BMP8+EGFR
BUN/Creatinine Ratio: 12 (ref 10–24)
BUN: 17 mg/dL (ref 8–27)
CO2: 21 mmol/L (ref 20–29)
Calcium: 9.5 mg/dL (ref 8.6–10.2)
Chloride: 106 mmol/L (ref 96–106)
Creatinine, Ser: 1.46 mg/dL — ABNORMAL HIGH (ref 0.76–1.27)
GFR calc Af Amer: 52 mL/min/{1.73_m2} — ABNORMAL LOW (ref >59)
GFR calc non Af Amer: 45 mL/min/{1.73_m2} — ABNORMAL LOW (ref >59)
Glucose: 175 mg/dL — ABNORMAL HIGH (ref 65–99)
Potassium: 4.3 mmol/L (ref 3.5–5.2)
Sodium: 143 mmol/L (ref 134–144)

## 2021-01-14 LAB — HEMOGLOBIN A1C
Est. average glucose Bld gHb Est-mCnc: 169 mg/dL
Hgb A1c MFr Bld: 7.5 % — ABNORMAL HIGH (ref 4.8–5.6)

## 2021-01-21 DIAGNOSIS — H2513 Age-related nuclear cataract, bilateral: Secondary | ICD-10-CM | POA: Diagnosis not present

## 2021-01-21 DIAGNOSIS — H43813 Vitreous degeneration, bilateral: Secondary | ICD-10-CM | POA: Diagnosis not present

## 2021-01-21 DIAGNOSIS — E119 Type 2 diabetes mellitus without complications: Secondary | ICD-10-CM | POA: Diagnosis not present

## 2021-01-21 LAB — HM DIABETES EYE EXAM

## 2021-01-25 ENCOUNTER — Telehealth: Payer: Medicare PPO

## 2021-01-25 ENCOUNTER — Ambulatory Visit (INDEPENDENT_AMBULATORY_CARE_PROVIDER_SITE_OTHER): Payer: Medicare PPO

## 2021-01-25 DIAGNOSIS — N1831 Chronic kidney disease, stage 3a: Secondary | ICD-10-CM | POA: Diagnosis not present

## 2021-01-25 DIAGNOSIS — E1122 Type 2 diabetes mellitus with diabetic chronic kidney disease: Secondary | ICD-10-CM | POA: Diagnosis not present

## 2021-01-25 DIAGNOSIS — I10 Essential (primary) hypertension: Secondary | ICD-10-CM

## 2021-01-25 DIAGNOSIS — I1310 Hypertensive heart and chronic kidney disease without heart failure, with stage 1 through stage 4 chronic kidney disease, or unspecified chronic kidney disease: Secondary | ICD-10-CM | POA: Diagnosis not present

## 2021-01-25 DIAGNOSIS — E785 Hyperlipidemia, unspecified: Secondary | ICD-10-CM

## 2021-01-28 NOTE — Chronic Care Management (AMB) (Signed)
Chronic Care Management   CCM RN Visit Note  01/25/2021 Name: Jose Ponce MRN: [id number] DOB: 1941/08/16  Subjective: Jose Ponce is a 80 y.o. year old male who is a primary care patient of Glendale Chard, MD. The care management team was consulted for assistance with disease management and care coordination needs.    Engaged with patient by telephone for follow up visit in response to provider referral for case management and/or care coordination services.   Consent to Services:  The patient was given information about Chronic Care Management services, agreed to services, and gave verbal consent prior to initiation of services.  Please see initial visit note for detailed documentation.   Patient agreed to services and verbal consent obtained.   Assessment: Review of patient past medical history, allergies, medications, health status, including review of consultants reports, laboratory and other test data, was performed as part of comprehensive evaluation and provision of chronic care management services.   SDOH (Social Determinants of Health) assessments and interventions performed: Yes, no acute challenges identified   CCM Care Plan  No Known Allergies  Outpatient Encounter Medications as of 01/25/2021  Medication Sig  . allopurinol (ZYLOPRIM) 100 MG tablet TAKE 1/2 TABLET BY MOUTH EVERY DAY  . aspirin 81 MG tablet Take 81 mg by mouth daily.  . Cholecalciferol 125 MCG (5000 UT) CHEW Chew 5,000 Units by mouth.   . dapagliflozin propanediol (FARXIGA) 5 MG TABS tablet Take by mouth daily.  . Diclofenac Sodium 3 % GEL Place onto the skin.  . Flaxseed, Linseed, (FLAX SEEDS PO) Take 1 tablet by mouth daily.  [name] ketoconazole (NIZORAL) 2 % cream Apply 1 application topically daily.  [name] lidocaine (LIDODERM) 5 % Place 1 patch onto the skin daily. Remove & Discard patch within 12 hours or as directed by MD Place over left lower back.  . Linaclotide (LINZESS) 290 MCG CAPS capsule Take 290  mcg by mouth daily.  . metoprolol succinate (TOPROL-XL) 50 MG 24 hr tablet TAKE 1 TABLET BY MOUTH EVERY DAY  . rosuvastatin (CRESTOR) 10 MG tablet TAKE 1 TABLET BY MOUTH EVERY DAY  . Semaglutide,0.25 or 0.5MG /DOS, (OZEMPIC, 0.25 OR 0.5 MG/DOSE,) 2 MG/1.5ML SOPN Inject 0.25 mg into the skin once a week.  . sildenafil (VIAGRA) 50 MG tablet Take 1 tablet (50 mg total) by mouth daily as needed for erectile dysfunction.  [name] telmisartan (MICARDIS) 20 MG tablet TAKE 1 TABLET BY MOUTH EVERY DAY   No facility-administered encounter medications on file as of 01/25/2021.    Patient Active Problem List   Diagnosis Date Noted  . Stage 3a chronic kidney disease ([name]) 01/10/2021  . Parenchymal renal hypertension 01/06/2021  . Benign hypertensive heart and renal disease 12/04/2019  . Coronary artery disease 01/30/2014  . Essential hypertension 01/30/2014  . Hyperlipidemia 01/30/2014  . Diabetes mellitus with stage 3 chronic kidney disease ([name]) 01/30/2014    Conditions to be addressed/monitored:DMT2, HTN, HLD, chronic renal disease stage 3b  Care Plan : Chronic Kidney (Adult)  Updates made by Lynne Logan, RN since 01/28/2021 12:00 AM    Problem: Disease Progression   Priority: Medium    Long-Range Goal: Disease Progression Prevented or Minimized   Start Date: 01/25/2021  Expected End Date: 07/28/2021  This Visit's Progress: On track  Priority: Medium  Note:   Current Barriers:   Ineffective Self Health Maintenance Clinical Goal(s):  [name] Collaboration with Glendale Chard, MD regarding development and update of comprehensive plan of care as evidenced by  provider attestation and co-signature . Inter-disciplinary care team collaboration (see longitudinal plan of care)  patient will work with care management team to address care coordination and chronic disease management needs related to Disease Management  Educational Needs  Care Coordination  Medication Management and  Education  Psychosocial Support   Interventions:   Evaluation of current treatment plan related to CKD self-management and patient's adherence to plan as established by provider.  Collaboration with Glendale Chard, MD regarding development and update of comprehensive plan of care as evidenced by provider attestation       and co-signature  Inter-disciplinary care team collaboration (see longitudinal plan of care)  Reviewed current GFR and educated on target ranges for optimal health  Educated on dietary and exercise recommendations  Educated on importance of increasing daily water intake to 64 oz unless otherwise directed  Discussed plans with patient for ongoing care management follow up and provided patient with direct contact information for care management team Patient [place] Activities:  . Patient will self administer medications as prescribed . Patient will attend all scheduled provider appointments . Patient will call pharmacy for medication refills . Patient will call provider office for new concerns or questions . Increase daily water intake to 64 oz daily  Follow Up Plan: Telephone follow up appointment with care management team member scheduled for: 03/25/21    Care Plan : Diabetes Type 2 (Adult)  Updates made by Lynne Logan, RN since 01/28/2021 12:00 AM    Problem: Glycemic Management (Diabetes, Type 2)   Priority: Medium    Long-Range Goal: Glycemic Management Optimized   Start Date: 01/25/2021  Expected End Date: 07/28/2021  This Visit's Progress: On track  Priority: Medium  Note:   Objective:  Lab Results  Component Value Date   HGBA1C 7.5 (H) 01/13/2021 .   Lab Results  Component Value Date   CREATININE 1.46 (H) 01/13/2021   CREATININE 1.48 (H) 10/07/2020   CREATININE 1.32 (H) 07/06/2020 .   [name] No results found for: EGFR Current Barriers:  [name] Knowledge Deficits related to basic Diabetes pathophysiology and self care/management . Knowledge  Deficits related to medications used for management of diabetes Case Manager Clinical Goal(s):  . patient will demonstrate improved adherence to prescribed treatment plan for diabetes self care/management as evidenced by: daily monitoring and recording of CBG  adherence to ADA/ carb modified diet exercise 5 days/week adherence to prescribed medication regimen contacting provider for new or worsened symptoms or questions Interventions:  . Collaboration with Glendale Chard, MD regarding development and update of comprehensive plan of care as evidenced by provider attestation and co-signature . Inter-disciplinary care team collaboration (see longitudinal plan of care) . Provided education to patient about basic DM disease process . Review of patient status, including review of consultants reports, relevant laboratory and other test results, and medications completed. . Reviewed medications with patient and discussed importance of medication adherence . Provided patient with written educational materials related to hypo and hyperglycemia and importance of correct treatment . Advised patient, providing education and rationale, to check cbg before meals and at bedtime and record, calling the CCM RN CM and or PCP for findings outside established parameters.   . Discussed plans with patient for ongoing care management follow up and provided patient with direct contact information for care management team Self-Care Activities Self administers oral medications as prescribed Attends all scheduled provider appointments Checks blood sugars as prescribed and utilize hyper and hypoglycemia protocol as needed Adheres to prescribed  ADA/carb modified Patient Goals: - to get diabetes under better control  Follow Up Plan: Telephone follow up appointment with care management team member scheduled for: 03/25/21    Plan:Telephone follow up appointment with care management team member scheduled for:  03/25/21  Barb Merino, RN, BSN, CCM Care Management Coordinator [place] [facility]  Direct Phone: [phone]

## 2021-01-28 NOTE — Patient Instructions (Signed)
Goals Addressed      Other   .  Chronic Kidney disease - disease progression prevented or minimized   On track     Timeframe:  Long-Range Goal Priority:  High Start Date: 01/25/21                            Expected End Date:  07/28/21        Follow Up Date: 03/25/21  . Patient will self administer medications as prescribed . Patient will attend all scheduled provider appointments . Patient will call pharmacy for medication refills . Patient will call provider office for new concerns or questions . Increase daily water intake to 64 oz per day unless otherwise directed               .  Gycemic Management Optimized   On track     Timeframe:  Long-Range Goal Priority:  Medium Start Date: 01/25/21                          Expected End Date:  07/28/21    Next Follow Up Date: 04/27/21  Self administers oral medications as prescribed Attends all scheduled provider appointments Checks blood sugars as prescribed and utilize hyper and hypoglycemia protocol as needed Adheres to prescribed ADA/carb modified

## 2021-03-23 ENCOUNTER — Encounter: Payer: Self-pay | Admitting: Internal Medicine

## 2021-03-23 ENCOUNTER — Ambulatory Visit (INDEPENDENT_AMBULATORY_CARE_PROVIDER_SITE_OTHER): Payer: Medicare PPO | Admitting: Internal Medicine

## 2021-03-23 ENCOUNTER — Other Ambulatory Visit: Payer: Self-pay

## 2021-03-23 VITALS — BP 130/82 | HR 54 | Temp 98.1°F | Ht 67.0 in | Wt 163.0 lb

## 2021-03-23 DIAGNOSIS — I2510 Atherosclerotic heart disease of native coronary artery without angina pectoris: Secondary | ICD-10-CM | POA: Diagnosis not present

## 2021-03-23 DIAGNOSIS — E663 Overweight: Secondary | ICD-10-CM

## 2021-03-23 DIAGNOSIS — Z6825 Body mass index (BMI) 25.0-25.9, adult: Secondary | ICD-10-CM | POA: Insufficient documentation

## 2021-03-23 DIAGNOSIS — E1122 Type 2 diabetes mellitus with diabetic chronic kidney disease: Secondary | ICD-10-CM

## 2021-03-23 DIAGNOSIS — N1831 Chronic kidney disease, stage 3a: Secondary | ICD-10-CM

## 2021-03-23 DIAGNOSIS — Z0001 Encounter for general adult medical examination with abnormal findings: Secondary | ICD-10-CM

## 2021-03-23 DIAGNOSIS — I1310 Hypertensive heart and chronic kidney disease without heart failure, with stage 1 through stage 4 chronic kidney disease, or unspecified chronic kidney disease: Secondary | ICD-10-CM

## 2021-03-23 DIAGNOSIS — H6121 Impacted cerumen, right ear: Secondary | ICD-10-CM | POA: Diagnosis not present

## 2021-03-23 DIAGNOSIS — Z6826 Body mass index (BMI) 26.0-26.9, adult: Secondary | ICD-10-CM

## 2021-03-23 DIAGNOSIS — Z0000 Encounter for general adult medical examination without abnormal findings: Secondary | ICD-10-CM

## 2021-03-23 NOTE — Patient Instructions (Signed)

## 2021-03-23 NOTE — Progress Notes (Signed)
Earleen Newport as a Education administrator for [name], MD.,have documented all relevant documentation on the behalf of [name], MD,as directed by  [name], MD while in the presence of [name], MD. This visit occurred during the SARS-CoV-2 public health emergency.  Safety protocols were in place, including screening questions prior to the visit, additional usage of staff PPE, and extensive cleaning of exam room while observing appropriate contact time as indicated for disinfecting solutions.  Subjective:     Patient ID: Jose Ponce , male    DOB: [birth year] , 80 y.o.   MRN: [id number]   Chief Complaint  Patient presents with  . Annual Exam  . Diabetes  . Hypertension    HPI  He is here today for a full physical examination. Reports compliance with meds, no concerns today.  He admits he is still not exercising as he should.   Diabetes He presents for his follow-up diabetic visit. He has type 2 diabetes mellitus. His disease course has been stable. There are no hypoglycemic associated symptoms. Pertinent negatives for hypoglycemia include no headaches. Pertinent negatives for diabetes include no blurred vision and no chest pain. There are no hypoglycemic complications. Diabetic complications include nephropathy. Risk factors for coronary artery disease include diabetes mellitus, dyslipidemia, hypertension and male sex. He participates in exercise intermittently. An ACE inhibitor/angiotensin II receptor blocker is being taken. Eye exam is current.  Hypertension This is a chronic problem. The current episode started more than 1 year ago. The problem has been gradually improving since onset. Pertinent negatives include no blurred vision, chest pain, headaches, palpitations or shortness of breath. Risk factors for coronary artery disease include diabetes mellitus, dyslipidemia and male gender. The current treatment provides moderate improvement. Compliance problems include  exercise.  Hypertensive end-organ damage includes CAD/MI.     Past Medical History:  Diagnosis Date  . Chronic kidney disease   . Coronary artery disease   . Gout   . Hyperlipidemia   . Hypertension   . Type 2 diabetes mellitus (HCC)      Family History  Problem Relation Age of Onset  . Asthma Mother   . Heart disease Father   . Stroke Father   . Heart attack Father 2  . Cancer Sister 76  . Diabetes Brother   . Heart disease Maternal Grandmother      Current Outpatient Medications:  .  allopurinol (ZYLOPRIM) 100 MG tablet, TAKE 1/2 TABLET BY MOUTH EVERY DAY, Disp: 45 tablet, Rfl: 1 .  aspirin 81 MG tablet, Take 81 mg by mouth daily., Disp: , Rfl:  .  Cholecalciferol 125 MCG (5000 UT) CHEW, Chew 5,000 Units by mouth. , Disp: , Rfl:  .  dapagliflozin propanediol (FARXIGA) 5 MG TABS tablet, Take by mouth daily., Disp: , Rfl:  .  Diclofenac Sodium 3 % GEL, Place onto the skin., Disp: , Rfl:  .  Flaxseed, Linseed, (FLAX SEEDS PO), Take 1 tablet by mouth daily., Disp: , Rfl:  .  ketoconazole (NIZORAL) 2 % cream, Apply 1 application topically daily., Disp: , Rfl:  .  lidocaine (LIDODERM) 5 %, Place 1 patch onto the skin daily. Remove & Discard patch within 12 hours or as directed by MD Place over left lower back., Disp: 30 patch, Rfl: 0 .  Linaclotide (LINZESS) 290 MCG CAPS capsule, Take 290 mcg by mouth daily., Disp: , Rfl:  .  metoprolol succinate (TOPROL-XL) 50 MG 24 hr tablet, TAKE 1 TABLET BY MOUTH  EVERY DAY, Disp: 90 tablet, Rfl: 1 .  rosuvastatin (CRESTOR) 10 MG tablet, TAKE 1 TABLET BY MOUTH EVERY DAY, Disp: 90 tablet, Rfl: 1 .  Semaglutide,0.25 or 0.5MG/DOS, (OZEMPIC, 0.25 OR 0.5 MG/DOSE,) 2 MG/1.5ML SOPN, Inject 0.25 mg into the skin once a week., Disp: 3 pen, Rfl: 3 .  sildenafil (VIAGRA) 50 MG tablet, Take 1 tablet (50 mg total) by mouth daily as needed for erectile dysfunction., Disp: 30 tablet, Rfl: 0 .  telmisartan (MICARDIS) 20 MG tablet, TAKE 1 TABLET BY MOUTH EVERY  DAY, Disp: 90 tablet, Rfl: 1   No Known Allergies   Men's preventive visit. Patient Health Questionnaire (PHQ-2) is  Flowsheet Row Clinical Support from 01/06/2021 in [facility]  PHQ-2 Total Score 0    . Patient is on a diabetic diet. Marital status: Divorced. Relevant history for alcohol use is:  Social History   Substance and Sexual Activity  Alcohol Use Not Currently  . Relevant history for tobacco use is:  Social History   Tobacco Use  Smoking Status Former Smoker  . Packs/day: 0.25  . Years: 20.00  . Pack years: 5.00  . Types: Cigarettes  . Quit date: 11/21/1984  . Years since quitting: 36.3  Smokeless Tobacco Never Used  .  Review of Systems  Constitutional: Negative.   HENT: Negative.   Eyes: Negative.  Negative for blurred vision.  Respiratory: Negative.  Negative for shortness of breath.   Cardiovascular: Negative.  Negative for chest pain and palpitations.  Endocrine: Negative.   Genitourinary: Negative.   Musculoskeletal: Negative.   Skin: Negative.   Allergic/Immunologic: Negative.   Neurological: Negative.  Negative for headaches.  Hematological: Negative.   Psychiatric/Behavioral: Negative.      Today's Vitals   03/23/21 1120  BP: 130/82  Pulse: (!) 54  Temp: 98.1 F (36.7 C)  SpO2: 99%  Weight: 163 lb (73.9 kg)  Height: _0  (1.702 m)   Body mass index is 25.53 kg/m.  Wt Readings from Last 3 Encounters:  03/23/21 163 lb (73.9 kg)  01/06/21 163 lb (73.9 kg)  01/06/21 163 lb 6.4 oz (74.1 kg)   Objective:  Physical Exam Vitals and nursing note reviewed.  Constitutional:      Appearance: Normal appearance.  HENT:     Head: Normocephalic and atraumatic.     Right Ear: Ear canal and external ear normal. There is impacted cerumen.     Left Ear: Tympanic membrane, ear canal and external ear normal.     Nose:     Comments: Masked     Mouth/Throat:     Comments: Masked  Eyes:     Extraocular Movements: Extraocular  movements intact.     Conjunctiva/sclera: Conjunctivae normal.     Pupils: Pupils are equal, round, and reactive to light.  Cardiovascular:     Rate and Rhythm: Normal rate and regular rhythm.     Pulses: Normal pulses.          Dorsalis pedis pulses are 2+ on the right side and 2+ on the left side.     Heart sounds: Normal heart sounds.  Pulmonary:     Effort: Pulmonary effort is normal.     Breath sounds: Normal breath sounds.  Chest:  Breasts:     Right: Normal. No swelling, bleeding, inverted nipple, mass or nipple discharge.     Left: Normal. No swelling, bleeding, inverted nipple, mass or nipple discharge.      Comments: Healed surgical sternal scar  Abdominal:     General: Abdomen is flat. Bowel sounds are normal.     Palpations: Abdomen is soft.  Genitourinary:    Comments: Deferred  Musculoskeletal:        General: Normal range of motion.     Cervical back: Normal range of motion and neck supple.  Feet:     Right foot:     Protective Sensation: 5 sites tested. 5 sites sensed.     Skin integrity: Callus and dry skin present.     Toenail Condition: Right toenails are normal.     Left foot:     Protective Sensation: 5 sites tested. 5 sites sensed.     Skin integrity: Callus and dry skin present.     Toenail Condition: Left toenails are normal.  Skin:    General: Skin is warm.  Neurological:     General: No focal deficit present.     Mental Status: He is alert.  Psychiatric:        Mood and Affect: Mood normal.        Behavior: Behavior normal.         Assessment And Plan:     1. Routine general medical examination at health care facility Comments: A full exam was performed. DRE deferred, he is followed by Urology. PATIENT IS ADVISED TO GET 30-45 MINUTES REGULAR EXERCISE NO LESS THAN FOUR TO FIVE DAYS PER WEEK - BOTH WEIGHTBEARING EXERCISES AND AEROBIC ARE RECOMMENDED.  PATIENT IS ADVISED TO FOLLOW A HEALTHY DIET WITH AT LEAST SIX FRUITS/VEGGIES PER DAY, DECREASE  INTAKE OF RED MEAT, AND TO INCREASE FISH INTAKE TO TWO DAYS PER WEEK.  MEATS/FISH SHOULD NOT BE FRIED, BAKED OR BROILED IS PREFERABLE.  IT IS ALSO IMPORTANT TO CUT BACK ON YOUR SUGAR INTAKE. PLEASE AVOID ANYTHING WITH ADDED SUGAR, CORN SYRUP OR OTHER SWEETENERS. IF YOU MUST USE A SWEETENER, YOU CAN TRY STEVIA. IT IS ALSO IMPORTANT TO AVOID ARTIFICIALLY SWEETENERS AND DIET BEVERAGES. LASTLY, I SUGGEST WEARING SPF 50 SUNSCREEN ON EXPOSED PARTS AND ESPECIALLY WHEN IN THE DIRECT SUNLIGHT FOR AN EXTENDED PERIOD OF TIME.  PLEASE AVOID FAST FOOD RESTAURANTS AND INCREASE YOUR WATER INTAKE.   2. Type 2 diabetes mellitus with stage 3a chronic kidney disease, without long-term current use of insulin (HCC) Comments: Diabetic foot exam was performed.  I will request his eye exam records from Dr. Venetia Maxon. I DISCUSSED WITH THE PATIENT AT LENGTH REGARDING THE GOALS OF GLYCEMIC CONTROL AND POSSIBLE LONG-TERM COMPLICATIONS.  I  ALSO STRESSED THE IMPORTANCE OF COMPLIANCE WITH HOME GLUCOSE MONITORING, DIETARY RESTRICTIONS INCLUDING AVOIDANCE OF SUGARY DRINKS/PROCESSED FOODS,  ALONG WITH REGULAR EXERCISE.  I  ALSO STRESSED THE IMPORTANCE OF ANNUAL EYE EXAMS, SELF FOOT CARE AND COMPLIANCE WITH OFFICE VISITS.  - CMP14+EGFR - Hemoglobin A1c - CBC no Diff - Lipid panel - POCT Urinalysis Dipstick (81002) - POCT UA - Microalbumin  3. Benign hypertensive heart and renal disease Comments: Chronic, controlled. EKG performed, SB w/ incomplete RBBB. He is encouraged to follow low sodium diet.  I will check renal function today.  - CMP14+EGFR - EKG 12-Lead  4. Coronary artery disease involving native coronary artery of native heart without angina pectoris Comments: He is s/p CABG March 2005. He is currently without anginal symptoms. Encouraged to follow heart healthy lifestyle.  5. Impacted cerumen of right ear AFTER OBTAINING VERBAL CONSENT, RIGHT EAR WAS FLUSHED BY IRRIGATION. HE TOLERATED PROCEDURE WELL WITHOUT ANY  COMPLICATIONS. NO TM ABNORMALITIES WERE NOTED.  - Ear Lavage  6. Overweight with body mass index (BMI) of 26 to 26.9 in adult Comments: His BMI is acceptable for his demographic. Encouraged to aim for at least 150 minutes of exercise per week.    Patient was given opportunity to ask questions. Patient verbalized understanding of the plan and was able to repeat key elements of the plan. All questions were answered to their satisfaction.   I, [name], MD, have reviewed all documentation for this visit. The documentation on 04/03/21 for the exam, diagnosis, procedures, and orders are all accurate and complete.   IF YOU HAVE BEEN REFERRED TO A SPECIALIST, IT MAY TAKE 1-2 WEEKS TO SCHEDULE/PROCESS THE REFERRAL. IF YOU HAVE NOT HEARD FROM US/SPECIALIST IN TWO WEEKS, PLEASE GIVE [place] A CALL AT [phone].   THE PATIENT IS ENCOURAGED TO PRACTICE SOCIAL DISTANCING DUE TO THE COVID-19 PANDEMIC.

## 2021-03-24 LAB — CMP14+EGFR
ALT: 20 IU/L (ref 0–44)
AST: 18 IU/L (ref 0–40)
Albumin/Globulin Ratio: 1.4 (ref 1.2–2.2)
Albumin: 4.2 g/dL (ref 3.7–4.7)
Alkaline Phosphatase: 64 IU/L (ref 44–121)
BUN/Creatinine Ratio: 14 (ref 10–24)
BUN: 20 mg/dL (ref 8–27)
Bilirubin Total: 0.5 mg/dL (ref 0.0–1.2)
CO2: 23 mmol/L (ref 20–29)
Calcium: 9.3 mg/dL (ref 8.6–10.2)
Chloride: 103 mmol/L (ref 96–106)
Creatinine, Ser: 1.39 mg/dL — ABNORMAL HIGH (ref 0.76–1.27)
Globulin, Total: 2.9 g/dL (ref 1.5–4.5)
Glucose: 133 mg/dL — ABNORMAL HIGH (ref 65–99)
Potassium: 4.4 mmol/L (ref 3.5–5.2)
Sodium: 139 mmol/L (ref 134–144)
Total Protein: 7.1 g/dL (ref 6.0–8.5)
eGFR: 52 mL/min/{1.73_m2} — ABNORMAL LOW (ref >59)

## 2021-03-24 LAB — CBC
Hematocrit: 46 % (ref 37.5–51.0)
Hemoglobin: 15.3 g/dL (ref 13.0–17.7)
MCH: 31.7 pg (ref 26.6–33.0)
MCHC: 33.3 g/dL (ref 31.5–35.7)
MCV: 95 fL (ref 79–97)
Platelets: 187 10*3/uL (ref 150–450)
RBC: 4.82 x10E6/uL (ref 4.14–5.80)
RDW: 12.9 % (ref 11.6–15.4)
WBC: 4.4 10*3/uL (ref 3.4–10.8)

## 2021-03-24 LAB — POCT URINALYSIS DIPSTICK
Bilirubin, UA: NEGATIVE
Blood, UA: NEGATIVE
Glucose, UA: NEGATIVE
Ketones, UA: NEGATIVE
Leukocytes, UA: NEGATIVE
Nitrite, UA: NEGATIVE
Protein, UA: NEGATIVE
Spec Grav, UA: 1.015 (ref 1.010–1.025)
Urobilinogen, UA: 0.2 E.U./dL
pH, UA: 5.5 (ref 5.0–8.0)

## 2021-03-24 LAB — HEMOGLOBIN A1C
Est. average glucose Bld gHb Est-mCnc: 171 mg/dL
Hgb A1c MFr Bld: 7.6 % — ABNORMAL HIGH (ref 4.8–5.6)

## 2021-03-24 LAB — LIPID PANEL
Chol/HDL Ratio: 2.4 ratio (ref 0.0–5.0)
Cholesterol, Total: 129 mg/dL (ref 100–199)
HDL: 54 mg/dL (ref >39)
LDL Chol Calc (NIH): 63 mg/dL (ref 0–99)
Triglycerides: 53 mg/dL (ref 0–149)
VLDL Cholesterol Cal: 12 mg/dL (ref 5–40)

## 2021-03-24 LAB — POCT UA - MICROALBUMIN
Albumin/Creatinine Ratio, Urine, POC: NORMAL
Creatinine, POC: 50 mg/dL
Microalbumin Ur, POC: 10 mg/L

## 2021-03-25 ENCOUNTER — Telehealth: Payer: Medicare PPO

## 2021-03-30 ENCOUNTER — Encounter: Payer: Self-pay | Admitting: Internal Medicine

## 2021-04-13 ENCOUNTER — Other Ambulatory Visit: Payer: Self-pay | Admitting: Internal Medicine

## 2021-04-14 ENCOUNTER — Telehealth: Payer: Self-pay | Admitting: Cardiovascular Disease

## 2021-04-14 NOTE — Telephone Encounter (Signed)
Recommend that he start with primary care.  He can probably be seen there quicker.  We can get an ultrasound, but I don't think we have any clinic appointments for a while.

## 2021-04-14 NOTE — Telephone Encounter (Signed)
Spoke with patient of Dr. Oval Linsey   He reports experiencing inconsistent right inner thigh pain This has been periodically going on for about 2 weeks He denies swelling  Leg is not painful to touch Leg is hot to touch He reports no issues with sensation in this leg  He reports a vein harvest for his prior CABG from this leg  He has not seen Dr. Oval Linsey since 11/2019   Advised will send a message to Dr. Awanda Mink to review and advise

## 2021-04-14 NOTE — Telephone Encounter (Signed)
Patient states he has a warm sensation located on his right inner thigh that comes and goes. It can be painful at times, but the pain is not excruciating.  Patient does not have any swelling in his legs.  He wanted to come in and have it looked at

## 2021-04-15 NOTE — Telephone Encounter (Signed)
Spoke with patient and he will start with PCP first

## 2021-04-22 ENCOUNTER — Ambulatory Visit: Payer: Medicare PPO | Admitting: Internal Medicine

## 2021-04-22 ENCOUNTER — Other Ambulatory Visit: Payer: Self-pay

## 2021-04-22 ENCOUNTER — Encounter: Payer: Self-pay | Admitting: Internal Medicine

## 2021-04-22 VITALS — BP 128/74 | HR 62 | Temp 98.9°F | Ht 67.0 in | Wt 159.8 lb

## 2021-04-22 DIAGNOSIS — R208 Other disturbances of skin sensation: Secondary | ICD-10-CM | POA: Diagnosis not present

## 2021-04-22 DIAGNOSIS — K5904 Chronic idiopathic constipation: Secondary | ICD-10-CM

## 2021-04-22 NOTE — Progress Notes (Signed)
I,[name],acting as a Education administrator for [name], MD.,have documented all relevant documentation on the behalf of [name], MD,as directed by  [name], MD while in the presence of [name], MD.  This visit occurred during the SARS-CoV-2 public health emergency.  Safety protocols were in place, including screening questions prior to the visit, additional usage of staff PPE, and extensive cleaning of exam room while observing appropriate contact time as indicated for disinfecting solutions.  Subjective:     Patient ID: Jose Ponce , male    DOB: [birth year] , 80 y.o.   MRN: [id number]   Chief Complaint  Patient presents with   warm sensation feeling in right thigh area    HPI  The patient is here for evaluation of a warm sensation feeling in the right thigh area that started 2-3 weeks ago.  The patient states that the sensation comes and goes. He states this started about 3 weeks ago while watching TV. He states his right inner thigh feels numb. He denies recent fall/back pain. He walks for 30 minutes twice a week.  He does not have any numbness/tingling on the lateral part of thigh.   Other The current episode started 1 to 4 weeks ago.    Past Medical History:  Diagnosis Date   Chronic kidney disease    Coronary artery disease    Gout    Hyperlipidemia    Hypertension    Type 2 diabetes mellitus ([place])      Family History  Problem Relation Age of Onset   Asthma Mother    Heart disease Father    Stroke Father    Heart attack Father 58   Cancer Sister 72   Diabetes Brother    Heart disease Maternal Grandmother      Current Outpatient Medications:    aspirin 81 MG tablet, Take 81 mg by mouth daily., Disp: , Rfl:    Cholecalciferol 125 MCG (5000 UT) CHEW, Chew 5,000 Units by mouth. , Disp: , Rfl:    dapagliflozin propanediol (FARXIGA) 5 MG TABS tablet, Take by mouth daily., Disp: , Rfl:    Diclofenac Sodium 3 % GEL, Place onto the skin., Disp:  , Rfl:    Flaxseed, Linseed, (FLAX SEEDS PO), Take 1 tablet by mouth daily., Disp: , Rfl:    ketoconazole (NIZORAL) 2 % cream, Apply 1 application topically daily., Disp: , Rfl:    lidocaine (LIDODERM) 5 %, Place 1 patch onto the skin daily. Remove & Discard patch within 12 hours or as directed by MD Place over left lower back., Disp: 30 patch, Rfl: 0   metoprolol succinate (TOPROL-XL) 50 MG 24 hr tablet, TAKE 1 TABLET BY MOUTH EVERY DAY, Disp: 90 tablet, Rfl: 1   rosuvastatin (CRESTOR) 10 MG tablet, TAKE 1 TABLET BY MOUTH EVERY DAY, Disp: 90 tablet, Rfl: 1   Semaglutide,0.25 or 0.5MG /DOS, (OZEMPIC, 0.25 OR 0.5 MG/DOSE,) 2 MG/1.5ML SOPN, Inject 0.25 mg into the skin once a week., Disp: 3 pen, Rfl: 3   sildenafil (VIAGRA) 50 MG tablet, Take 1 tablet (50 mg total) by mouth daily as needed for erectile dysfunction., Disp: 30 tablet, Rfl: 0   telmisartan (MICARDIS) 20 MG tablet, TAKE 1 TABLET BY MOUTH EVERY DAY, Disp: 90 tablet, Rfl: 1   allopurinol (ZYLOPRIM) 100 MG tablet, TAKE 1/2 TABLET BY MOUTH EVERY DAY, Disp: 45 tablet, Rfl: 1   linaclotide (LINZESS) 145 MCG CAPS capsule, Take 145 mcg by mouth daily before breakfast.,  Disp: , Rfl:    No Known Allergies   Review of Systems  Constitutional: Negative.   Respiratory: Negative.    Cardiovascular: Negative.   Gastrointestinal: Negative.   Neurological: Negative.   Psychiatric/Behavioral: Negative.      Today's Vitals   04/22/21 1406  BP: 128/74  Pulse: 62  Temp: 98.9 F (37.2 C)  TempSrc: Oral  Weight: 159 lb 12.8 oz (72.5 kg)  Height: 5\' 7"  (1.702 m)  PainSc: 2   PainLoc: Leg   Body mass index is 25.03 kg/m.  Wt Readings from Last 3 Encounters:  04/22/21 159 lb 12.8 oz (72.5 kg)  03/23/21 163 lb (73.9 kg)  01/06/21 163 lb (73.9 kg)   BP Readings from Last 3 Encounters:  04/22/21 128/74  03/23/21 130/82  01/06/21 114/72   Objective:  Physical Exam Vitals and nursing note reviewed.  Constitutional:      Appearance:  Normal appearance.  HENT:     Head: Normocephalic and atraumatic.     Nose:     Comments: Masked     Mouth/Throat:     Comments: Masked  Eyes:     Extraocular Movements: Extraocular movements intact.  Cardiovascular:     Rate and Rhythm: Normal rate and regular rhythm.     Heart sounds: Normal heart sounds.  Pulmonary:     Effort: Pulmonary effort is normal.     Breath sounds: Normal breath sounds.  Musculoskeletal:     Cervical back: Normal range of motion.  Skin:    General: Skin is warm.     Comments: No overlying erythema on right inner thigh. Dysesthesia not noted on exam.   Neurological:     General: No focal deficit present.     Mental Status: He is alert.  Psychiatric:        Mood and Affect: Mood normal.        Assessment And Plan:     1. Dysesthesia Comments: I am not sure what is causing his sx. He was given stretching exercises to perform. Advised to rub mentholated cream on low back/inner thigh.  If persistent, I will consider rx gabapentin.   2. Chronic idiopathic constipation Comments: He was given samples of Linzess 145mg , advised to also start magnesium nightly.  He is encouraged to update me next week to let me know if this regimen is effective.   Patient was given opportunity to ask questions. Patient verbalized understanding of the plan and was able to repeat key elements of the plan. All questions were answered to their satisfaction.   I, [name], MD, have reviewed all documentation for this visit. The documentation on 05/16/21 for the exam, diagnosis, procedures, and orders are all accurate and complete.   IF YOU HAVE BEEN REFERRED TO A SPECIALIST, IT MAY TAKE 1-2 WEEKS TO SCHEDULE/PROCESS THE REFERRAL. IF YOU HAVE NOT HEARD FROM US/SPECIALIST IN TWO WEEKS, PLEASE GIVE [place] A CALL AT [phone] X [phone].   THE PATIENT IS ENCOURAGED TO PRACTICE SOCIAL DISTANCING DUE TO THE COVID-19 PANDEMIC.

## 2021-04-26 ENCOUNTER — Telehealth: Payer: Medicare PPO

## 2021-04-26 ENCOUNTER — Telehealth: Payer: Self-pay

## 2021-04-26 NOTE — Telephone Encounter (Signed)
  Care Management   Follow Up Note   04/26/2021 Name: Jose Ponce MRN: [id number] DOB: 1941/04/21   Referred by: Glendale Chard, MD Reason for referral : Chronic Care Management (RNCM Follow up call - 1st attempt )   An unsuccessful telephone outreach was attempted today. The patient was referred to the case management team for assistance with care management and care coordination.   Follow Up Plan: A HIPPA compliant phone message was left for the patient providing contact information and requesting a return call.    Barb Merino, RN, BSN, CCM Care Management Coordinator [place] [facility]  Direct Phone: [phone]

## 2021-05-03 ENCOUNTER — Other Ambulatory Visit: Payer: Self-pay | Admitting: Internal Medicine

## 2021-05-04 ENCOUNTER — Other Ambulatory Visit: Payer: Self-pay

## 2021-05-28 ENCOUNTER — Other Ambulatory Visit: Payer: Self-pay | Admitting: Internal Medicine

## 2021-06-09 ENCOUNTER — Telehealth: Payer: Medicare PPO

## 2021-07-06 ENCOUNTER — Telehealth: Payer: Self-pay

## 2021-07-06 ENCOUNTER — Telehealth: Payer: Medicare PPO

## 2021-07-06 NOTE — Telephone Encounter (Signed)
  Care Management   Follow Up Note   07/06/2021 Name: Jose Ponce MRN: [id number] DOB: 1941/11/17   Referred by: Glendale Chard, MD Reason for referral : Chronic Care Management (RN CM Follow up call )   A second unsuccessful telephone outreach was attempted today. The patient was referred to the case management team for assistance with care management and care coordination.   Follow Up Plan: A HIPPA compliant phone message was left for the patient providing contact information and requesting a return call.   Barb Merino, RN, BSN, CCM Care Management Coordinator [place] [facility]  Direct Phone: [phone]

## 2021-07-28 ENCOUNTER — Ambulatory Visit: Payer: Medicare PPO | Admitting: Internal Medicine

## 2021-07-30 ENCOUNTER — Telehealth: Payer: Medicare PPO

## 2021-08-25 ENCOUNTER — Other Ambulatory Visit: Payer: Self-pay

## 2021-08-25 ENCOUNTER — Encounter: Payer: Self-pay | Admitting: Internal Medicine

## 2021-08-25 ENCOUNTER — Ambulatory Visit: Payer: Medicare PPO | Admitting: Internal Medicine

## 2021-08-25 VITALS — BP 116/80 | HR 60 | Temp 98.1°F | Ht 68.2 in | Wt 161.4 lb

## 2021-08-25 DIAGNOSIS — E1122 Type 2 diabetes mellitus with diabetic chronic kidney disease: Secondary | ICD-10-CM | POA: Diagnosis not present

## 2021-08-25 DIAGNOSIS — R413 Other amnesia: Secondary | ICD-10-CM | POA: Diagnosis not present

## 2021-08-25 DIAGNOSIS — I2510 Atherosclerotic heart disease of native coronary artery without angina pectoris: Secondary | ICD-10-CM | POA: Diagnosis not present

## 2021-08-25 DIAGNOSIS — N1831 Chronic kidney disease, stage 3a: Secondary | ICD-10-CM

## 2021-08-25 DIAGNOSIS — I1310 Hypertensive heart and chronic kidney disease without heart failure, with stage 1 through stage 4 chronic kidney disease, or unspecified chronic kidney disease: Secondary | ICD-10-CM | POA: Diagnosis not present

## 2021-08-25 NOTE — Patient Instructions (Signed)
Diabetes Mellitus and Nutrition, Adult When you have diabetes, or diabetes mellitus, it is very important to have healthy eating habits because your blood sugar (glucose) levels are greatly affected by what you eat and drink. Eating healthy foods in the right amounts, at about the same times every day, can help you:  Control your blood glucose.  Lower your risk of heart disease.  Improve your blood pressure.  Reach or maintain a healthy weight. What can affect my meal plan? Every person with diabetes is different, and each person has different needs for a meal plan. Your health care provider may recommend that you work with a dietitian to make a meal plan that is best for you. Your meal plan may vary depending on factors such as:  The calories you need.  The medicines you take.  Your weight.  Your blood glucose, blood pressure, and cholesterol levels.  Your activity level.  Other health conditions you have, such as heart or kidney disease. How do carbohydrates affect me? Carbohydrates, also called carbs, affect your blood glucose level more than any other type of food. Eating carbs naturally raises the amount of glucose in your blood. Carb counting is a method for keeping track of how many carbs you eat. Counting carbs is important to keep your blood glucose at a healthy level, especially if you use insulin or take certain oral diabetes medicines. It is important to know how many carbs you can safely have in each meal. This is different for every person. Your dietitian can help you calculate how many carbs you should have at each meal and for each snack. How does alcohol affect me? Alcohol can cause a sudden decrease in blood glucose (hypoglycemia), especially if you use insulin or take certain oral diabetes medicines. Hypoglycemia can be a life-threatening condition. Symptoms of hypoglycemia, such as sleepiness, dizziness, and confusion, are similar to symptoms of having too much  alcohol.  Do not drink alcohol if: ? Your health care provider tells you not to drink. ? You are pregnant, may be pregnant, or are planning to become pregnant.  If you drink alcohol: ? Do not drink on an empty stomach. ? Limit how much you use to:  0-1 drink a day for women.  0-2 drinks a day for men. ? Be aware of how much alcohol is in your drink. In the [place] one drink equals one 12 oz bottle of beer (355 mL), one 5 oz glass of wine (148 mL), or one 1 oz glass of hard liquor (44 mL). ? Keep yourself hydrated with water, diet soda, or unsweetened iced tea.  Keep in mind that regular soda, juice, and other mixers may contain a lot of sugar and must be counted as carbs. What are tips for following this plan? Reading food labels  Start by checking the serving size on the "Nutrition Facts" label of packaged foods and drinks. The amount of calories, carbs, fats, and other nutrients listed on the label is based on one serving of the item. Many items contain more than one serving per package.  Check the total grams (g) of carbs in one serving. You can calculate the number of servings of carbs in one serving by dividing the total carbs by 15. For example, if a food has 30 g of total carbs per serving, it would be equal to 2 servings of carbs.  Check the number of grams (g) of saturated fats and trans fats in one serving. Choose foods that have   a low amount or none of these fats.  Check the number of milligrams (mg) of salt (sodium) in one serving. Most people should limit total sodium intake to less than 2,300 mg per day.  Always check the nutrition information of foods labeled as "low-fat" or "nonfat." These foods may be higher in added sugar or refined carbs and should be avoided.  Talk to your dietitian to identify your daily goals for nutrients listed on the label. Shopping  Avoid buying canned, pre-made, or processed foods. These foods tend to be high in fat, sodium, and added  sugar.  Shop around the outside edge of the grocery store. This is where you will most often find fresh fruits and vegetables, bulk grains, fresh meats, and fresh dairy. Cooking  Use low-heat cooking methods, such as baking, instead of high-heat cooking methods like deep frying.  Cook using healthy oils, such as olive, canola, or sunflower oil.  Avoid cooking with butter, cream, or high-fat meats. Meal planning  Eat meals and snacks regularly, preferably at the same times every day. Avoid going long periods of time without eating.  Eat foods that are high in fiber, such as fresh fruits, vegetables, beans, and whole grains. Talk with your dietitian about how many servings of carbs you can eat at each meal.  Eat 4-6 oz (112-168 g) of lean protein each day, such as lean meat, chicken, fish, eggs, or tofu. One ounce (oz) of lean protein is equal to: ? 1 oz (28 g) of meat, chicken, or fish. ? 1 egg. ?  cup (62 g) of tofu.  Eat some foods each day that contain healthy fats, such as avocado, nuts, seeds, and fish.   What foods should I eat? Fruits Berries. Apples. Oranges. Peaches. Apricots. Plums. Grapes. Mango. Papaya. Pomegranate. Kiwi. Cherries. Vegetables Lettuce. Spinach. Leafy greens, including kale, chard, collard greens, and mustard greens. Beets. Cauliflower. Cabbage. Broccoli. Carrots. Green beans. Tomatoes. Peppers. Onions. Cucumbers. Brussels sprouts. Grains Whole grains, such as whole-wheat or whole-grain bread, crackers, tortillas, cereal, and pasta. Unsweetened oatmeal. Quinoa. Brown or wild rice. Meats and other proteins Seafood. Poultry without skin. Lean cuts of poultry and beef. Tofu. Nuts. Seeds. Dairy Low-fat or fat-free dairy products such as milk, yogurt, and cheese. The items listed above may not be a complete list of foods and beverages you can eat. Contact a dietitian for more information. What foods should I avoid? Fruits Fruits canned with  syrup. Vegetables Canned vegetables. Frozen vegetables with butter or cream sauce. Grains Refined white flour and flour products such as bread, pasta, snack foods, and cereals. Avoid all processed foods. Meats and other proteins Fatty cuts of meat. Poultry with skin. Breaded or fried meats. Processed meat. Avoid saturated fats. Dairy Full-fat yogurt, cheese, or milk. Beverages Sweetened drinks, such as soda or iced tea. The items listed above may not be a complete list of foods and beverages you should avoid. Contact a dietitian for more information. Questions to ask a health care provider  Do I need to meet with a diabetes educator?  Do I need to meet with a dietitian?  What number can I call if I have questions?  When are the best times to check my blood glucose? Where to find more information:  American Diabetes Association: diabetes.org  Academy of Nutrition and Dietetics: www.eatright.org  National Institute of Diabetes and Digestive and Kidney Diseases: www.niddk.nih.gov  Association of Diabetes Care and Education Specialists: www.diabeteseducator.org Summary  It is important to have healthy eating   habits because your blood sugar (glucose) levels are greatly affected by what you eat and drink.  A healthy meal plan will help you control your blood glucose and maintain a healthy lifestyle.  Your health care provider may recommend that you work with a dietitian to make a meal plan that is best for you.  Keep in mind that carbohydrates (carbs) and alcohol have immediate effects on your blood glucose levels. It is important to count carbs and to use alcohol carefully. This information is not intended to replace advice given to you by your health care provider. Make sure you discuss any questions you have with your health care provider. Document Revised: 10/15/2019 Document Reviewed: 10/15/2019 Elsevier Patient Education  2021 Elsevier Inc.  

## 2021-08-25 NOTE — Progress Notes (Signed)
Jeri Cos [place]ittleton,acting as a Neurosurgeon for [name], MD.,have documented all relevant documentation on the behalf of [name], MD,as directed by  [name], MD while in the presence of [name], MD.  This visit occurred during the SARS-CoV-2 public health emergency.  Safety protocols were in place, including screening questions prior to the visit, additional usage of staff PPE, and extensive cleaning of exam room while observing appropriate contact time as indicated for disinfecting solutions.  Subjective:     Patient ID: Jose Ponce , male    DOB: 02/10/1941 , 80 y.o.   MRN: [id number]   Chief Complaint  Patient presents with   Diabetes   Hypertension    HPI  Pt is here today for diabetes, and blood pressure check. He reports compliance with meds. He denies headaches, chest pain and shortness of breath.   Diabetes He presents for his follow-up diabetic visit. He has type 2 diabetes mellitus. His disease course has been stable. There are no hypoglycemic associated symptoms. Pertinent negatives for hypoglycemia include no dizziness or headaches. Pertinent negatives for diabetes include no blurred vision, no fatigue, no polydipsia, no polyphagia and no polyuria. There are no hypoglycemic complications. Diabetic complications include nephropathy. Risk factors for coronary artery disease include diabetes mellitus, dyslipidemia, hypertension and male sex. He is following a diabetic diet. He participates in exercise three times a week. An ACE inhibitor/angiotensin II receptor blocker is being taken.  Hypertension This is a chronic problem. The current episode started more than 1 year ago. The problem has been gradually improving since onset. Pertinent negatives include no blurred vision or headaches. The current treatment provides moderate improvement. Compliance problems include exercise.  Hypertensive end-organ damage includes kidney disease and CAD/MI.    Past  Medical History:  Diagnosis Date   Chronic kidney disease    Coronary artery disease    Gout    Hyperlipidemia    Hypertension    Type 2 diabetes mellitus (HCC)      Family History  Problem Relation Age of Onset   Asthma Mother    Heart disease Father    Stroke Father    Heart attack Father 30   Cancer Sister 58   Diabetes Brother    Heart disease Maternal Grandmother      Current Outpatient Medications:    allopurinol (ZYLOPRIM) 100 MG tablet, TAKE 1/2 TABLET BY MOUTH EVERY DAY, Disp: 45 tablet, Rfl: 1   aspirin 81 MG tablet, Take 81 mg by mouth daily., Disp: , Rfl:    Cholecalciferol 125 MCG (5000 UT) CHEW, Chew 5,000 Units by mouth. , Disp: , Rfl:    dapagliflozin propanediol (FARXIGA) 5 MG TABS tablet, Take by mouth daily., Disp: , Rfl:    Diclofenac Sodium 3 % GEL, Place onto the skin., Disp: , Rfl:    Flaxseed, Linseed, (FLAX SEEDS PO), Take 1 tablet by mouth daily., Disp: , Rfl:    ketoconazole (NIZORAL) 2 % cream, Apply 1 application topically daily., Disp: , Rfl:    lidocaine (LIDODERM) 5 %, Place 1 patch onto the skin daily. Remove & Discard patch within 12 hours or as directed by MD Place over left lower back., Disp: 30 patch, Rfl: 0   linaclotide (LINZESS) 145 MCG CAPS capsule, Take 145 mcg by mouth daily before breakfast., Disp: , Rfl:    metoprolol succinate (TOPROL-XL) 50 MG 24 hr tablet, TAKE 1 TABLET BY MOUTH EVERY DAY, Disp: 90 tablet, Rfl: 1  rosuvastatin (CRESTOR) 10 MG tablet, TAKE 1 TABLET BY MOUTH EVERY DAY, Disp: 90 tablet, Rfl: 1   Semaglutide,0.25 or 0.5MG /DOS, (OZEMPIC, 0.25 OR 0.5 MG/DOSE,) 2 MG/1.5ML SOPN, Inject 0.25 mg into the skin once a week., Disp: 3 pen, Rfl: 3   sildenafil (VIAGRA) 50 MG tablet, Take 1 tablet (50 mg total) by mouth daily as needed for erectile dysfunction., Disp: 30 tablet, Rfl: 0   telmisartan (MICARDIS) 20 MG tablet, TAKE 1 TABLET BY MOUTH EVERY DAY, Disp: 90 tablet, Rfl: 1  Current Facility-Administered Medications:     cyanocobalamin ((VITAMIN B-12)) injection 1,000 mcg, 1,000 mcg, Intramuscular, Once, Glendale Chard, MD   No Known Allergies   Review of Systems  Constitutional: Negative.  Negative for fatigue.  Eyes:  Negative for blurred vision.  Respiratory: Negative.    Cardiovascular: Negative.   Gastrointestinal: Negative.   Endocrine: Negative for polydipsia, polyphagia and polyuria.  Skin: Negative.   Neurological: Negative.  Negative for dizziness and headaches.  Psychiatric/Behavioral: Negative.      Today's Vitals   08/25/21 1520  BP: 116/80  Pulse: 60  Temp: 98.1 F (36.7 C)  Weight: 161 lb 6.4 oz (73.2 kg)  Height: 5' 8.2" (1.732 m)  PainSc: 0-No pain   Body mass index is 24.4 kg/m.  Wt Readings from Last 3 Encounters:  09/21/21 162 lb 6.4 oz (73.7 kg)  09/14/21 163 lb (73.9 kg)  09/07/21 162 lb (73.5 kg)    Objective:  Physical Exam Vitals and nursing note reviewed.  Constitutional:      Appearance: Normal appearance.  HENT:     Head: Normocephalic and atraumatic.     Nose:     Comments: Masked     Mouth/Throat:     Comments: Masked  Eyes:     Extraocular Movements: Extraocular movements intact.  Cardiovascular:     Rate and Rhythm: Normal rate and regular rhythm.     Heart sounds: Normal heart sounds.  Pulmonary:     Effort: Pulmonary effort is normal.     Breath sounds: Normal breath sounds.  Musculoskeletal:     Cervical back: Normal range of motion.  Skin:    General: Skin is warm.  Neurological:     General: No focal deficit present.     Mental Status: He is alert.  Psychiatric:        Mood and Affect: Mood normal.        Assessment And Plan:     1. Type 2 diabetes mellitus with stage 3a chronic kidney disease, without long-term current use of insulin (HCC) Comments: Chronic, currently on Ozempic. No med changes today. Encouraged to follow dietary guidelines. He will rto in four months for re-evaluation.  - BMP8+eGFR - Hemoglobin A1c  2. Benign  hypertensive heart and renal disease Comments: Chronic, controlled. No med changes. Encouraged to follow heart healthy diet, stay well hydrated and keep BP controlled to decrease risk of CKD progression.  3. Coronary artery disease involving native coronary artery of native heart without angina pectoris Comments: Chronic, s/p CABG 2005. He is currently asymptomatic. He is encouraged to follow Mediterranean diet.   4. Memory change Comments: I will check vitamin B12 level and supplement as needed. Thyroid fxn normal NOv 2021.  Will consider Neuro eval as well. - Vitamin B12   Patient was given opportunity to ask questions. Patient verbalized understanding of the plan and was able to repeat key elements of the plan. All questions were answered to their satisfaction.  I, [name], MD, have reviewed all documentation for this visit. The documentation on 08/25/21 for the exam, diagnosis, procedures, and orders are all accurate and complete.   IF YOU HAVE BEEN REFERRED TO A SPECIALIST, IT MAY TAKE 1-2 WEEKS TO SCHEDULE/PROCESS THE REFERRAL. IF YOU HAVE NOT HEARD FROM US/SPECIALIST IN TWO WEEKS, PLEASE GIVE [place] A CALL AT [phone] X [phone].   THE PATIENT IS ENCOURAGED TO PRACTICE SOCIAL DISTANCING DUE TO THE COVID-19 PANDEMIC.

## 2021-08-26 LAB — BMP8+EGFR
BUN/Creatinine Ratio: 15 (ref 10–24)
BUN: 22 mg/dL (ref 8–27)
CO2: 22 mmol/L (ref 20–29)
Calcium: 9.4 mg/dL (ref 8.6–10.2)
Chloride: 104 mmol/L (ref 96–106)
Creatinine, Ser: 1.45 mg/dL — ABNORMAL HIGH (ref 0.76–1.27)
Glucose: 105 mg/dL — ABNORMAL HIGH (ref 70–99)
Potassium: 4.4 mmol/L (ref 3.5–5.2)
Sodium: 140 mmol/L (ref 134–144)
eGFR: 49 mL/min/{1.73_m2} — ABNORMAL LOW (ref >59)

## 2021-08-26 LAB — HEMOGLOBIN A1C
Est. average glucose Bld gHb Est-mCnc: 160 mg/dL
Hgb A1c MFr Bld: 7.2 % — ABNORMAL HIGH (ref 4.8–5.6)

## 2021-08-26 LAB — VITAMIN B12: Vitamin B-12: 366 pg/mL (ref 232–1245)

## 2021-08-27 ENCOUNTER — Ambulatory Visit: Payer: Self-pay

## 2021-08-27 ENCOUNTER — Telehealth: Payer: Medicare PPO

## 2021-08-27 DIAGNOSIS — I1310 Hypertensive heart and chronic kidney disease without heart failure, with stage 1 through stage 4 chronic kidney disease, or unspecified chronic kidney disease: Secondary | ICD-10-CM

## 2021-08-27 DIAGNOSIS — E1122 Type 2 diabetes mellitus with diabetic chronic kidney disease: Secondary | ICD-10-CM

## 2021-08-27 DIAGNOSIS — I2510 Atherosclerotic heart disease of native coronary artery without angina pectoris: Secondary | ICD-10-CM

## 2021-08-27 DIAGNOSIS — N1831 Chronic kidney disease, stage 3a: Secondary | ICD-10-CM

## 2021-08-27 NOTE — Chronic Care Management (AMB) (Signed)
  Care Management   Follow Up Note   08/27/2021 Name: Jose Ponce MRN: [id number] DOB: 06-11-1941   Referred by: Glendale Chard, MD Reason for referral : Chronic Care Management (RN CM Follow up (3rd attempt))   Third unsuccessful telephone outreach was attempted today. The patient was referred to the case management team for assistance with care management and care coordination. The patient's primary care provider has been notified of our unsuccessful attempts to make or maintain contact with the patient. The care management team is pleased to engage with this patient at any time in the future should he/she be interested in assistance from the care management team.   Follow Up Plan: We have been unable to make contact with the patient for follow up. The care management team is available to follow up with the patient after provider conversation with the patient regarding recommendation for care management engagement and subsequent re-referral to the care management team.   Barb Merino, RN, BSN, CCM Care Management Coordinator [facility]  Direct Phone: [phone]

## 2021-08-31 ENCOUNTER — Ambulatory Visit: Payer: Medicare PPO

## 2021-08-31 ENCOUNTER — Other Ambulatory Visit: Payer: Self-pay

## 2021-08-31 VITALS — BP 110/60 | HR 70 | Temp 97.6°F | Ht 68.0 in | Wt 161.2 lb

## 2021-08-31 DIAGNOSIS — E538 Deficiency of other specified B group vitamins: Secondary | ICD-10-CM

## 2021-08-31 MED ORDER — CYANOCOBALAMIN 1000 MCG/ML IJ SOLN: 1000.0000 ug | Freq: Once | INTRAMUSCULAR | Status: AC

## 2021-08-31 NOTE — Progress Notes (Signed)
Pt here for b12 injection

## 2021-09-07 ENCOUNTER — Other Ambulatory Visit: Payer: Self-pay

## 2021-09-07 ENCOUNTER — Ambulatory Visit: Payer: Medicare PPO

## 2021-09-07 VITALS — BP 112/70 | HR 63 | Temp 98.4°F | Ht 68.0 in | Wt 162.0 lb

## 2021-09-07 DIAGNOSIS — E538 Deficiency of other specified B group vitamins: Secondary | ICD-10-CM

## 2021-09-07 NOTE — Progress Notes (Signed)
The patient is here today for his 2nd weekly of 4 weekly vitamin b12 injections.

## 2021-09-14 ENCOUNTER — Other Ambulatory Visit: Payer: Self-pay

## 2021-09-14 ENCOUNTER — Ambulatory Visit (INDEPENDENT_AMBULATORY_CARE_PROVIDER_SITE_OTHER): Payer: Medicare PPO

## 2021-09-14 VITALS — BP 128/70 | HR 70 | Temp 97.7°F | Ht 67.2 in | Wt 163.0 lb

## 2021-09-14 DIAGNOSIS — E538 Deficiency of other specified B group vitamins: Secondary | ICD-10-CM | POA: Diagnosis not present

## 2021-09-14 MED ORDER — CYANOCOBALAMIN 1000 MCG/ML IJ SOLN
1000.0000 ug | Freq: Once | INTRAMUSCULAR | Status: AC
  Administered 2021-09-14: 1000 ug via INTRAMUSCULAR

## 2021-09-14 NOTE — Patient Instructions (Signed)
Vitamin B12 Injection What is this medication? Vitamin B12 (VAHY tuh min B12) prevents and treats low vitamin B12 levels in your body. It is used in people who do not get enough vitamin B12 from their diet or when their digestive tract does not absorb enough. Vitamin B12 plays an important role in maintaining the health of your nervous system and red blood cells. This medicine may be used for other purposes; ask your health care provider or pharmacist if you have questions. COMMON BRAND NAME(S): B-12 Compliance Kit, B-12 Injection Kit, Cyomin, Dodex, LA-12, Nutri-Twelve, Physicians EZ Use B-12, Primabalt What should I tell my care team before I take this medication? They need to know if you have any of these conditions: Kidney disease Leber's disease Megaloblastic anemia An unusual or allergic reaction to cyanocobalamin, cobalt, other medications, foods, dyes, or preservatives Pregnant or trying to get pregnant Breast-feeding How should I use this medication? This medication is injected into a muscle or deeply under the skin. It is usually given in a clinic or care team's office. However, your care team may teach you how to inject yourself. Follow all instructions. Talk to your care team about the use of this medication in children. Special care may be needed. Overdosage: If you think you have taken too much of this medicine contact a poison control center or emergency room at once. NOTE: This medicine is only for you. Do not share this medicine with others. What if I miss a dose? If you are given your dose at a clinic or care team's office, call to reschedule your appointment. If you give your own injections, and you miss a dose, take it as soon as you can. If it is almost time for your next dose, take only that dose. Do not take double or extra doses. What may interact with this medication? Colchicine Heavy alcohol intake This list may not describe all possible interactions. Give your health  care provider a list of all the medicines, herbs, non-prescription drugs, or dietary supplements you use. Also tell them if you smoke, drink alcohol, or use illegal drugs. Some items may interact with your medicine. What should I watch for while using this medication? Visit your care team regularly. You may need blood work done while you are taking this medication. You may need to follow a special diet. Talk to your care team. Limit your alcohol intake and avoid smoking to get the best benefit. What side effects may I notice from receiving this medication? Side effects that you should report to your care team as soon as possible: Allergic reactions-skin rash, itching, hives, swelling of the face, lips, tongue, or throat Swelling of the ankles, hands, or feet Trouble breathing Side effects that usually do not require medical attention (report to your care team if they continue or are bothersome): Diarrhea This list may not describe all possible side effects. Call your doctor for medical advice about side effects. You may report side effects to FDA at 1-800-FDA-1088. Where should I keep my medication? Keep out of the reach of children. Store at room temperature between 15 and 30 degrees C (59 and 85 degrees F). Protect from light. Throw away any unused medication after the expiration date. NOTE: This sheet is a summary. It may not cover all possible information. If you have questions about this medicine, talk to your doctor, pharmacist, or health care provider.  2022 Elsevier/Gold Standard (2020-12-28 11:47:06)

## 2021-09-14 NOTE — Progress Notes (Signed)
Pt here for 3rd b12 injection.

## 2021-09-15 ENCOUNTER — Telehealth: Payer: Medicare PPO

## 2021-09-15 NOTE — Progress Notes (Signed)
This encounter was created in error - please disregard.

## 2021-09-21 ENCOUNTER — Ambulatory Visit (INDEPENDENT_AMBULATORY_CARE_PROVIDER_SITE_OTHER): Payer: Medicare PPO

## 2021-09-21 ENCOUNTER — Other Ambulatory Visit: Payer: Self-pay

## 2021-09-21 VITALS — BP 128/88 | HR 85 | Temp 98.1°F | Ht 67.0 in | Wt 162.4 lb

## 2021-09-21 DIAGNOSIS — E538 Deficiency of other specified B group vitamins: Secondary | ICD-10-CM | POA: Diagnosis not present

## 2021-09-21 MED ORDER — CYANOCOBALAMIN 1000 MCG/ML IJ SOLN
1000.0000 ug | Freq: Once | INTRAMUSCULAR | Status: AC
  Administered 2021-09-21: 1000 ug via INTRAMUSCULAR

## 2021-09-21 NOTE — Progress Notes (Signed)
Pt here for b12 injection

## 2021-10-07 ENCOUNTER — Telehealth: Payer: Self-pay

## 2021-10-07 NOTE — Chronic Care Management (AMB) (Signed)
    Chronic Care Management Pharmacy Assistant   Name: Jose Ponce  MRN: [id number] DOB: [birth year]   Reason for Encounter: Patient Assistance Coordination  10/07/2021- 2023 Renewal form filled out for [employer] with [employer] patient assistance program. Mailed to patient.   Medications: Outpatient Encounter Medications as of 10/07/2021  Medication Sig   allopurinol (ZYLOPRIM) 100 MG tablet TAKE 1/2 TABLET BY MOUTH EVERY DAY   aspirin 81 MG tablet Take 81 mg by mouth daily.   Cholecalciferol 125 MCG (5000 UT) CHEW Chew 5,000 Units by mouth.    dapagliflozin propanediol (FARXIGA) 5 MG TABS tablet Take by mouth daily.   Diclofenac Sodium 3 % GEL Place onto the skin.   Flaxseed, Linseed, (FLAX SEEDS PO) Take 1 tablet by mouth daily.   ketoconazole (NIZORAL) 2 % cream Apply 1 application topically daily.   lidocaine (LIDODERM) 5 % Place 1 patch onto the skin daily. Remove & Discard patch within 12 hours or as directed by MD Place over left lower back.   linaclotide (LINZESS) 145 MCG CAPS capsule Take 145 mcg by mouth daily before breakfast.   metoprolol succinate (TOPROL-XL) 50 MG 24 hr tablet TAKE 1 TABLET BY MOUTH EVERY DAY   rosuvastatin (CRESTOR) 10 MG tablet TAKE 1 TABLET BY MOUTH EVERY DAY   Semaglutide,0.25 or 0.5MG /DOS, (OZEMPIC, 0.25 OR 0.5 MG/DOSE,) 2 MG/1.5ML SOPN Inject 0.25 mg into the skin once a week.   sildenafil (VIAGRA) 50 MG tablet Take 1 tablet (50 mg total) by mouth daily as needed for erectile dysfunction.   telmisartan (MICARDIS) 20 MG tablet TAKE 1 TABLET BY MOUTH EVERY DAY   Facility-Administered Encounter Medications as of 10/07/2021  Medication   cyanocobalamin ((VITAMIN B-12)) injection 1,000 mcg   Pattricia Boss, [place] Pharmacist Assistant [phone]

## 2021-10-27 ENCOUNTER — Encounter: Payer: Self-pay | Admitting: Internal Medicine

## 2021-10-27 ENCOUNTER — Ambulatory Visit: Payer: Medicare PPO | Admitting: Internal Medicine

## 2021-10-27 ENCOUNTER — Other Ambulatory Visit: Payer: Self-pay

## 2021-10-27 VITALS — BP 112/74 | HR 71 | Temp 97.7°F | Ht 67.0 in | Wt 162.0 lb

## 2021-10-27 DIAGNOSIS — E538 Deficiency of other specified B group vitamins: Secondary | ICD-10-CM

## 2021-10-27 DIAGNOSIS — N1831 Chronic kidney disease, stage 3a: Secondary | ICD-10-CM

## 2021-10-27 DIAGNOSIS — I2510 Atherosclerotic heart disease of native coronary artery without angina pectoris: Secondary | ICD-10-CM

## 2021-10-27 DIAGNOSIS — I1310 Hypertensive heart and chronic kidney disease without heart failure, with stage 1 through stage 4 chronic kidney disease, or unspecified chronic kidney disease: Secondary | ICD-10-CM | POA: Diagnosis not present

## 2021-10-27 MED ORDER — CYANOCOBALAMIN 1000 MCG/ML IJ SOLN: 1000.0000 ug | Freq: Once | INTRAMUSCULAR | Status: DC

## 2021-10-27 NOTE — Patient Instructions (Signed)

## 2021-10-27 NOTE — Progress Notes (Signed)
I,[name],acting as a Education administrator for [name], MD.,have documented all relevant documentation on the behalf of [name], MD,as directed by  [name], MD while in the presence of [name], MD.  This visit occurred during the SARS-CoV-2 public health emergency.  Safety protocols were in place, including screening questions prior to the visit, additional usage of staff PPE, and extensive cleaning of exam room while observing appropriate contact time as indicated for disinfecting solutions.  Subjective:     Patient ID: Jose Ponce , male    DOB: May 13, 1941 , 80 y.o.   MRN: [id number]   Chief Complaint  Patient presents with   B12 Injection    HPI  Patient presents today for his first monthly vitamin b12 injection. He has completed four weekly injections. He states he has noticed improved cognition and focus since starting the injections. He has not had any ill side effects.     Past Medical History:  Diagnosis Date   Chronic kidney disease    Coronary artery disease    Gout    Hyperlipidemia    Hypertension    Type 2 diabetes mellitus ([place])      Family History  Problem Relation Age of Onset   Asthma Mother    Heart disease Father    Stroke Father    Heart attack Father 26   Cancer Sister 18   Diabetes Brother    Heart disease Maternal Grandmother      Current Outpatient Medications:    allopurinol (ZYLOPRIM) 100 MG tablet, TAKE 1/2 TABLET BY MOUTH EVERY DAY, Disp: 45 tablet, Rfl: 1   aspirin 81 MG tablet, Take 81 mg by mouth daily., Disp: , Rfl:    Cholecalciferol 125 MCG (5000 UT) CHEW, Chew 5,000 Units by mouth. , Disp: , Rfl:    dapagliflozin propanediol (FARXIGA) 5 MG TABS tablet, Take by mouth daily., Disp: , Rfl:    Diclofenac Sodium 3 % GEL, Place onto the skin., Disp: , Rfl:    Flaxseed, Linseed, (FLAX SEEDS PO), Take 1 tablet by mouth daily., Disp: , Rfl:    ketoconazole (NIZORAL) 2 % cream, Apply 1 application topically daily.,  Disp: , Rfl:    lidocaine (LIDODERM) 5 %, Place 1 patch onto the skin daily. Remove & Discard patch within 12 hours or as directed by MD Place over left lower back., Disp: 30 patch, Rfl: 0   linaclotide (LINZESS) 145 MCG CAPS capsule, Take 145 mcg by mouth daily before breakfast., Disp: , Rfl:    metoprolol succinate (TOPROL-XL) 50 MG 24 hr tablet, TAKE 1 TABLET BY MOUTH EVERY DAY, Disp: 90 tablet, Rfl: 1   rosuvastatin (CRESTOR) 10 MG tablet, TAKE 1 TABLET BY MOUTH EVERY DAY, Disp: 90 tablet, Rfl: 1   Semaglutide,0.25 or 0.5MG /DOS, (OZEMPIC, 0.25 OR 0.5 MG/DOSE,) 2 MG/1.5ML SOPN, Inject 0.25 mg into the skin once a week., Disp: 3 pen, Rfl: 3   sildenafil (VIAGRA) 50 MG tablet, Take 1 tablet (50 mg total) by mouth daily as needed for erectile dysfunction., Disp: 30 tablet, Rfl: 0   telmisartan (MICARDIS) 20 MG tablet, TAKE 1 TABLET BY MOUTH EVERY DAY, Disp: 90 tablet, Rfl: 1  Current Facility-Administered Medications:    cyanocobalamin ((VITAMIN B-12)) injection 1,000 mcg, 1,000 mcg, Intramuscular, Once, [place] Chard, MD   No Known Allergies   Review of Systems  Constitutional: Negative.   Respiratory: Negative.    Cardiovascular: Negative.   Gastrointestinal: Negative.   Neurological: Negative.  Psychiatric/Behavioral: Negative.      Today's Vitals   10/27/21 1527  BP: 112/74  Pulse: 71  Temp: 97.7 F (36.5 C)  Weight: 162 lb (73.5 kg)  Height: 5\' 7"  (1.702 m)   Body mass index is 25.37 kg/m.  Wt Readings from Last 3 Encounters:  10/27/21 162 lb (73.5 kg)  09/21/21 162 lb 6.4 oz (73.7 kg)  09/14/21 163 lb (73.9 kg)    BP Readings from Last 3 Encounters:  10/27/21 112/74  09/21/21 128/88  09/14/21 128/70    Objective:  Physical Exam Vitals and nursing note reviewed.  Constitutional:      Appearance: Normal appearance.  HENT:     Head: Normocephalic and atraumatic.     Nose:     Comments: Masked     Mouth/Throat:     Comments: Masked  Eyes:     Extraocular  Movements: Extraocular movements intact.  Cardiovascular:     Rate and Rhythm: Normal rate and regular rhythm.     Heart sounds: Normal heart sounds.  Pulmonary:     Effort: Pulmonary effort is normal.     Breath sounds: Normal breath sounds.  Musculoskeletal:     Cervical back: Normal range of motion.  Skin:    General: Skin is warm.  Neurological:     General: No focal deficit present.     Mental Status: He is alert.  Psychiatric:        Mood and Affect: Mood normal.        Assessment And Plan:     1. B12 deficiency Comments: I will check vitamin B12 level. He was scheduled to get vit B12 injection, but he left prior to its administration. He will rto next week for next B12 injection - Vitamin B12  2. Benign hypertensive heart and renal disease Comments: Chronic, well controlled. No med changes.   3. Coronary artery disease involving native coronary artery of native heart without angina pectoris Comments: Chronic, stable - denies having any anginal symptoms.   4. Stage 3a chronic kidney disease ([place]) Comments: Chronic, encouraged to stay well hydrated and keep BP/BS controlled.    Patient was given opportunity to ask questions. Patient verbalized understanding of the plan and was able to repeat key elements of the plan. All questions were answered to their satisfaction.   I, [name], MD, have reviewed all documentation for this visit. The documentation on 10/27/21 for the exam, diagnosis, procedures, and orders are all accurate and complete.   IF YOU HAVE BEEN REFERRED TO A SPECIALIST, IT MAY TAKE 1-2 WEEKS TO SCHEDULE/PROCESS THE REFERRAL. IF YOU HAVE NOT HEARD FROM US/SPECIALIST IN TWO WEEKS, PLEASE GIVE [place] A CALL AT [phone] X [phone].   THE PATIENT IS ENCOURAGED TO PRACTICE SOCIAL DISTANCING DUE TO THE COVID-19 PANDEMIC.

## 2021-10-28 LAB — VITAMIN B12: Vitamin B-12: 749 pg/mL (ref 232–1245)

## 2021-11-03 ENCOUNTER — Ambulatory Visit (INDEPENDENT_AMBULATORY_CARE_PROVIDER_SITE_OTHER): Payer: Medicare PPO

## 2021-11-03 ENCOUNTER — Other Ambulatory Visit: Payer: Self-pay

## 2021-11-03 VITALS — BP 112/74 | HR 71 | Temp 97.7°F | Ht 67.0 in | Wt 162.0 lb

## 2021-11-03 DIAGNOSIS — E538 Deficiency of other specified B group vitamins: Secondary | ICD-10-CM

## 2021-11-03 MED ORDER — CYANOCOBALAMIN 1000 MCG/ML IJ SOLN
1000.0000 ug | Freq: Once | INTRAMUSCULAR | Status: AC
  Administered 2021-11-03: 13:00:00 1000 ug via INTRAMUSCULAR

## 2021-11-03 NOTE — Progress Notes (Signed)
Patient presents today for his 2nd vitamin b12 injection. YL,RMA

## 2021-11-12 ENCOUNTER — Other Ambulatory Visit: Payer: Self-pay | Admitting: Internal Medicine

## 2021-11-24 ENCOUNTER — Other Ambulatory Visit: Payer: Self-pay | Admitting: Internal Medicine

## 2021-11-30 ENCOUNTER — Other Ambulatory Visit: Payer: Self-pay | Admitting: Internal Medicine

## 2021-11-30 ENCOUNTER — Ambulatory Visit: Payer: Medicare PPO

## 2021-12-07 ENCOUNTER — Other Ambulatory Visit: Payer: Self-pay

## 2021-12-07 ENCOUNTER — Ambulatory Visit (INDEPENDENT_AMBULATORY_CARE_PROVIDER_SITE_OTHER): Payer: Medicare PPO

## 2021-12-07 VITALS — BP 116/70 | HR 70 | Temp 98.3°F | Ht 67.0 in | Wt 162.2 lb

## 2021-12-07 DIAGNOSIS — E538 Deficiency of other specified B group vitamins: Secondary | ICD-10-CM | POA: Diagnosis not present

## 2021-12-07 MED ORDER — CYANOCOBALAMIN 1000 MCG/ML IJ SOLN
1000.0000 ug | Freq: Once | INTRAMUSCULAR | Status: AC
  Administered 2021-12-07: 1000 ug via INTRAMUSCULAR

## 2021-12-07 NOTE — Progress Notes (Signed)
Patient is here for 3rd  b12 injection.

## 2021-12-14 ENCOUNTER — Other Ambulatory Visit: Payer: Self-pay

## 2021-12-14 ENCOUNTER — Ambulatory Visit (INDEPENDENT_AMBULATORY_CARE_PROVIDER_SITE_OTHER): Payer: Medicare PPO

## 2021-12-14 VITALS — BP 116/70 | HR 70 | Temp 97.9°F | Ht 67.0 in | Wt 162.0 lb

## 2021-12-14 DIAGNOSIS — E538 Deficiency of other specified B group vitamins: Secondary | ICD-10-CM

## 2021-12-14 MED ORDER — CYANOCOBALAMIN 1000 MCG/ML IJ SOLN
1000.0000 ug | Freq: Once | INTRAMUSCULAR | Status: AC
  Administered 2021-12-14: 15:00:00 1000 ug via INTRAMUSCULAR

## 2021-12-14 NOTE — Progress Notes (Signed)
Patient is here for b12 injection.  

## 2021-12-20 ENCOUNTER — Telehealth: Payer: Self-pay | Admitting: Internal Medicine

## 2021-12-20 NOTE — Telephone Encounter (Signed)
Left message asking pt to call [phone] Please r/s 01/12/22 AW[facility] appt.  [facility] will be out of office [employer] schedule calendar year

## 2021-12-22 DIAGNOSIS — Z8546 Personal history of malignant neoplasm of prostate: Secondary | ICD-10-CM | POA: Diagnosis not present

## 2021-12-22 LAB — PSA: PSA: <0.015

## 2021-12-23 DIAGNOSIS — Z8546 Personal history of malignant neoplasm of prostate: Secondary | ICD-10-CM | POA: Diagnosis not present

## 2021-12-23 DIAGNOSIS — N5201 Erectile dysfunction due to arterial insufficiency: Secondary | ICD-10-CM | POA: Diagnosis not present

## 2022-01-05 ENCOUNTER — Ambulatory Visit (INDEPENDENT_AMBULATORY_CARE_PROVIDER_SITE_OTHER): Payer: Medicare PPO

## 2022-01-05 ENCOUNTER — Other Ambulatory Visit: Payer: Self-pay

## 2022-01-05 VITALS — BP 106/60 | HR 61 | Temp 97.7°F | Ht 70.0 in | Wt 163.8 lb

## 2022-01-05 DIAGNOSIS — Z0000 Encounter for general adult medical examination without abnormal findings: Secondary | ICD-10-CM | POA: Diagnosis not present

## 2022-01-05 NOTE — Patient Instructions (Signed)
[name] , Thank you for taking time to come for your Medicare Wellness Visit. I appreciate your ongoing commitment to your health goals. Please review the following plan we discussed and let me know if I can assist you in the future.   Screening recommendations/referrals: Colonoscopy: not required Recommended yearly ophthalmology/optometry visit for glaucoma screening and checkup Recommended yearly dental visit for hygiene and checkup  Vaccinations: Influenza vaccine: completed 08/03/2021, due next flu season Pneumococcal vaccine: completed 09/11/2019 Tdap vaccine: completed 02/29/2012, due 02/28/2022 Shingles vaccine: completed   Covid-19:  08/03/2021, 03/01/2021, 08/17/2020, 12/30/2019, 12/10/2019  Advanced directives: Advance directive discussed with you today. I have provided a copy for you to complete at home and have notarized. Once this is complete please bring a copy in to our office so we can scan it into your chart.  Conditions/risks identified: none  Next appointment: Follow up in one year for your annual wellness visit.   Preventive Care 80 Years and Older, Male Preventive care refers to lifestyle choices and visits with your health care provider that can promote health and wellness. What does preventive care include? A yearly physical exam. This is also called an annual well check. Dental exams once or twice a year. Routine eye exams. Ask your health care provider how often you should have your eyes checked. Personal lifestyle choices, including: Daily care of your teeth and gums. Regular physical activity. Eating a healthy diet. Avoiding tobacco and drug use. Limiting alcohol use. Practicing safe sex. Taking low doses of aspirin every day. Taking vitamin and mineral supplements as recommended by your health care provider. What happens during an annual well check? The services and screenings done by your health care provider during your annual well check will depend on your  age, overall health, lifestyle risk factors, and family history of disease. Counseling  Your health care provider may ask you questions about your: Alcohol use. Tobacco use. Drug use. Emotional well-being. Home and relationship well-being. Sexual activity. Eating habits. History of falls. Memory and ability to understand (cognition). Work and work Statistician. Screening  You may have the following tests or measurements: Height, weight, and BMI. Blood pressure. Lipid and cholesterol levels. These may be checked every 5 years, or more frequently if you are over 56 years old. Skin check. Lung cancer screening. You may have this screening every year starting at age 15 if you have a 30-pack-year history of smoking and currently smoke or have quit within the past 15 years. Fecal occult blood test (FOBT) of the stool. You may have this test every year starting at age 28. Flexible sigmoidoscopy or colonoscopy. You may have a sigmoidoscopy every 5 years or a colonoscopy every 10 years starting at age 27. Prostate cancer screening. Recommendations will vary depending on your family history and other risks. Hepatitis C blood test. Hepatitis B blood test. Sexually transmitted disease (STD) testing. Diabetes screening. This is done by checking your blood sugar (glucose) after you have not eaten for a while (fasting). You may have this done every 1-3 years. Abdominal aortic aneurysm (AAA) screening. You may need this if you are a current or former smoker. Osteoporosis. You may be screened starting at age 43 if you are at high risk. Talk with your health care provider about your test results, treatment options, and if necessary, the need for more tests. Vaccines  Your health care provider may recommend certain vaccines, such as: Influenza vaccine. This is recommended every year. Tetanus, diphtheria, and acellular pertussis (Tdap, Td)  vaccine. You may need a Td booster every 10 years. Zoster  vaccine. You may need this after age 42. Pneumococcal 13-valent conjugate (PCV13) vaccine. One dose is recommended after age 102. Pneumococcal polysaccharide (PPSV23) vaccine. One dose is recommended after age 67. Talk to your health care provider about which screenings and vaccines you need and how often you need them. This information is not intended to replace advice given to you by your health care provider. Make sure you discuss any questions you have with your health care provider. Document Released: 12/04/2015 Document Revised: 07/27/2016 Document Reviewed: 09/08/2015 Elsevier Interactive Patient Education  2017 [place] Prevention in the Home Falls can cause injuries. They can happen to people of all ages. There are many things you can do to make your home safe and to help prevent falls. What can I do on the outside of my home? Regularly fix the edges of walkways and driveways and fix any cracks. Remove anything that might make you trip as you walk through a door, such as a raised step or threshold. Trim any bushes or trees on the path to your home. Use bright outdoor lighting. Clear any walking paths of anything that might make someone trip, such as rocks or tools. Regularly check to see if handrails are loose or broken. Make sure that both sides of any steps have handrails. Any raised decks and porches should have guardrails on the edges. Have any leaves, snow, or ice cleared regularly. Use sand or salt on walking paths during winter. Clean up any spills in your garage right away. This includes oil or grease spills. What can I do in the bathroom? Use night lights. Install grab bars by the toilet and in the tub and shower. Do not use towel bars as grab bars. Use non-skid mats or decals in the tub or shower. If you need to sit down in the shower, use a plastic, non-slip stool. Keep the floor dry. Clean up any water that spills on the floor as soon as it happens. Remove  soap buildup in the tub or shower regularly. Attach bath mats securely with double-sided non-slip rug tape. Do not have throw rugs and other things on the floor that can make you trip. What can I do in the bedroom? Use night lights. Make sure that you have a light by your bed that is easy to reach. Do not use any sheets or blankets that are too big for your bed. They should not hang down onto the floor. Have a firm chair that has side arms. You can use this for support while you get dressed. Do not have throw rugs and other things on the floor that can make you trip. What can I do in the kitchen? Clean up any spills right away. Avoid walking on wet floors. Keep items that you use a lot in easy-to-reach places. If you need to reach something above you, use a strong step stool that has a grab bar. Keep electrical cords out of the way. Do not use floor polish or wax that makes floors slippery. If you must use wax, use non-skid floor wax. Do not have throw rugs and other things on the floor that can make you trip. What can I do with my stairs? Do not leave any items on the stairs. Make sure that there are handrails on both sides of the stairs and use them. Fix handrails that are broken or loose. Make sure that handrails are as long  as the stairways. Check any carpeting to make sure that it is firmly attached to the stairs. Fix any carpet that is loose or worn. Avoid having throw rugs at the top or bottom of the stairs. If you do have throw rugs, attach them to the floor with carpet tape. Make sure that you have a light switch at the top of the stairs and the bottom of the stairs. If you do not have them, ask someone to add them for you. What else can I do to help prevent falls? Wear shoes that: Do not have high heels. Have rubber bottoms. Are comfortable and fit you well. Are closed at the toe. Do not wear sandals. If you use a stepladder: Make sure that it is fully opened. Do not climb a  closed stepladder. Make sure that both sides of the stepladder are locked into place. Ask someone to hold it for you, if possible. Clearly mark and make sure that you can see: Any grab bars or handrails. First and last steps. Where the edge of each step is. Use tools that help you move around (mobility aids) if they are needed. These include: Canes. Walkers. Scooters. Crutches. Turn on the lights when you go into a dark area. Replace any light bulbs as soon as they burn out. Set up your furniture so you have a clear path. Avoid moving your furniture around. If any of your floors are uneven, fix them. If there are any pets around you, be aware of where they are. Review your medicines with your doctor. Some medicines can make you feel dizzy. This can increase your chance of falling. Ask your doctor what other things that you can do to help prevent falls. This information is not intended to replace advice given to you by your health care provider. Make sure you discuss any questions you have with your health care provider. Document Released: 09/03/2009 Document Revised: 04/14/2016 Document Reviewed: 12/12/2014 Elsevier Interactive Patient Education  2017 [employer].

## 2022-01-05 NOTE — Progress Notes (Signed)
This visit occurred during the SARS-CoV-2 public health emergency.  Safety protocols were in place, including screening questions prior to the visit, additional usage of staff PPE, and extensive cleaning of exam room while observing appropriate contact time as indicated for disinfecting solutions.  Subjective:   Jose Ponce is a 81 y.o. male who presents for Medicare Annual/Subsequent preventive examination.  Review of Systems     Cardiac Risk Factors include: advanced age (>78men, >67 women);diabetes mellitus;dyslipidemia;hypertension;male gender     Objective:    Today's Vitals   01/05/22 1550  BP: 106/60  Pulse: 61  Temp: 97.7 F (36.5 C)  TempSrc: Oral  SpO2: 99%  Weight: 163 lb 12.8 oz (74.3 kg)  Height: 5\' 10"  (1.778 m)   Body mass index is 23.5 kg/m.  Advanced Directives 01/05/2022 01/06/2021 12/04/2019 06/18/2019 11/08/2018 10/09/2018 12/02/2014  Does Patient Have a Medical Advance Directive? No No Yes Yes Yes No No  Type of Advance Directive - Public librarian;Living will [place];Living will Living will - -  Does patient want to make changes to medical advance directive? - - - No - Patient declined - - -  Copy of [place] in Chart? - - No - copy requested No - copy requested - - -  Would patient like information on creating a medical advance directive? No - Patient declined No - Patient declined - - - No - Patient declined No - patient declined information    Current Medications (verified) Outpatient Encounter Medications as of 01/05/2022  Medication Sig   allopurinol (ZYLOPRIM) 100 MG tablet TAKE 1/2 TABLET BY MOUTH EVERY DAY   aspirin 81 MG tablet Take 81 mg by mouth daily.   Cholecalciferol 125 MCG (5000 UT) CHEW Chew 5,000 Units by mouth.    dapagliflozin propanediol (FARXIGA) 5 MG TABS tablet Take by mouth daily.   Diclofenac Sodium 3 % GEL Place onto the skin.   Flaxseed, Linseed, (FLAX SEEDS PO) Take  1 tablet by mouth daily.   ketoconazole (NIZORAL) 2 % cream Apply 1 application topically daily.   lidocaine (LIDODERM) 5 % Place 1 patch onto the skin daily. Remove & Discard patch within 12 hours or as directed by MD Place over left lower back.   linaclotide (LINZESS) 145 MCG CAPS capsule Take 145 mcg by mouth daily before breakfast.   metoprolol succinate (TOPROL-XL) 50 MG 24 hr tablet TAKE 1 TABLET BY MOUTH EVERY DAY   rosuvastatin (CRESTOR) 10 MG tablet TAKE 1 TABLET BY MOUTH EVERY DAY   Semaglutide,0.25 or 0.5MG /DOS, (OZEMPIC, 0.25 OR 0.5 MG/DOSE,) 2 MG/1.5ML SOPN Inject 0.25 mg into the skin once a week.   sildenafil (VIAGRA) 50 MG tablet Take 1 tablet (50 mg total) by mouth daily as needed for erectile dysfunction.   telmisartan (MICARDIS) 20 MG tablet TAKE 1 TABLET BY MOUTH EVERY DAY   Facility-Administered Encounter Medications as of 01/05/2022  Medication   cyanocobalamin ((VITAMIN B-12)) injection 1,000 mcg    Allergies (verified) Patient has no known allergies.   History: Past Medical History:  Diagnosis Date   Chronic kidney disease    Coronary artery disease    Gout    Hyperlipidemia    Hypertension    Type 2 diabetes mellitus ([place])    Past Surgical History:  Procedure Laterality Date   CARDIAC CATHETERIZATION  01/16/2004   multi-vessel coronary disease 40-50% Left main, 100% mid LAD75-80% ostial stenosis   CARDIOVASCULAR STRESS TEST  08/09/2004   post CABG, low risk scan   CARDIOVASCULAR STRESS TEST  10/23/2003   mild septal ischemia, EF43%,    CORONARY ARTERY BYPASS GRAFT  02/05/2004   CABGx3 Dr.Bartle   Lower Ext. Doppler  10/20/2011   Right Anterior Tibial: occlusive disease, Left Anterior Tibial occluded w/reconstitution at ankle, mildly abnormal LEA   NM MYOCAR PERF WALL MOTION  06/07/2011   protocol:Persantine, EF60%, negative for ischemia, low risk scan   NM MYOCAR PERF WALL MOTION  08/05/2009   protocol: Persantine, EF57%. no evidence of ischemia,  low risk scan.    TRANSPERINEAL IMPLANT OF RADIATION SEEDS W/ ULTRASOUND     Family History  Problem Relation Age of Onset   Asthma Mother    Heart disease Father    Stroke Father    Heart attack Father 58   Cancer Sister 59   Diabetes Brother    Heart disease Maternal Grandmother    Social History   Socioeconomic History   Marital status: Divorced    Spouse name: Not on file   Number of children: Not on file   Years of education: Not on file   Highest education level: Not on file  Occupational History   Occupation: retired  Tobacco Use   Smoking status: Former    Packs/day: 0.25    Years: 20.00    Pack years: 5.00    Types: Cigarettes    Quit date: 11/21/1984    Years since quitting: 37.1   Smokeless tobacco: Never  Vaping Use   Vaping Use: Never used  Substance and Sexual Activity   Alcohol use: Not Currently   Drug use: Not Currently   Sexual activity: Yes  Other Topics Concern   Not on file  Social History Narrative   Not on file   Social Determinants of Health   Financial Resource Strain: Low Risk    Difficulty of Paying Living Expenses: Not hard at all  Food Insecurity: No Food Insecurity   Worried About Charity fundraiser in the Last Year: Never true   [place] in the Last Year: Never true  Transportation Needs: No Transportation Needs   Lack of Transportation (Medical): No   Lack of Transportation (Non-Medical): No  Physical Activity: Insufficiently Active   Days of Exercise per Week: 3 days   Minutes of Exercise per Session: 20 min  Stress: No Stress Concern Present   Feeling of Stress : Not at all  Social Connections: Moderately Integrated   Frequency of Communication with Friends and Family: More than three times a week   Frequency of Social Gatherings with Friends and Family: More than three times a week   Attends Religious Services: More than 4 times per year   Active Member of [employer] or Organizations: Yes   Attends Archivist  Meetings: 1 to 4 times per year   Marital Status: Divorced    Tobacco Counseling Counseling given: Not Answered   Clinical Intake:  Pre-visit preparation completed: Yes  Pain : No/denies pain     Nutritional Status: BMI of 19-24  Normal Nutritional Risks: None Diabetes: Yes  How often do you need to have someone help you when you read instructions, pamphlets, or other written materials from your doctor or pharmacy?: 1 - Never  Diabetic? Yes Nutrition Risk Assessment:  Has the patient had any N/V/D within the last 2 months?  No  Does the patient have any non-healing wounds?  No  Has the patient had  any unintentional weight loss or weight gain?  No   Diabetes:  Is the patient diabetic?  Yes  If diabetic, was a CBG obtained today?  No  Did the patient bring in their glucometer from home?  No  How often do you monitor your CBG's? daily.   Financial Strains and Diabetes Management:  Are you having any financial strains with the device, your supplies or your medication? No .  Does the patient want to be seen by Chronic Care Management for management of their diabetes?  No  Would the patient like to be referred to a Nutritionist or for Diabetic Management?  No   Diabetic Exams:  Diabetic Eye Exam: Completed 01/21/2021 Diabetic Foot Exam: Completed 03/23/2021   Interpreter Needed?: No  Information entered by :: NAllen LPN   Activities of Daily Living In your present state of health, do you have any difficulty performing the following activities: 01/05/2022 01/06/2021  Hearing? N N  Vision? N N  Difficulty concentrating or making decisions? N N  Walking or climbing stairs? N N  Dressing or bathing? N N  Doing errands, shopping? N N  Preparing Food and eating ? N N  Using the Toilet? N N  In the past six months, have you accidently leaked urine? N N  Do you have problems with loss of bowel control? N N  Managing your Medications? N N  Managing your Finances? N N   Housekeeping or managing your Housekeeping? N N  Some recent data might be hidden    Patient Care Team: Glendale Chard, MD as PCP - General (Internal Medicine) Lorretta Harp, MD as PCP - Cardiology (Cardiology) Mayford Knife, [facility] as Pharmacist (Pharmacist)  Indicate any recent Medical Services you may have received from other than [facility] providers in the past year (date may be approximate).     Assessment:   This is a routine wellness examination for Jose Ponce.  Hearing/Vision screen Vision Screening - Comments:: Regular eye exams, Dr. Venetia Maxon  Dietary issues and exercise activities discussed: Current Exercise Habits: Home exercise routine, Type of exercise: Other - see comments (foot pedaler), Time (Minutes): 20, Frequency (Times/Week): 3, Weekly Exercise (Minutes/Week): 60   Goals Addressed             This Visit's Progress    Patient Stated       01/05/2022, no goals       Depression Screen PHQ 2/9 Scores 01/05/2022 01/06/2021 01/06/2021 12/04/2019 06/18/2019 03/14/2019 11/08/2018  PHQ - 2 Score 0 0 0 0 0 0 0  PHQ- 9 Score - - - 0 0 - 0    Fall Risk Fall Risk  01/05/2022 01/06/2021 01/06/2021 12/04/2019 06/18/2019  Falls in the past year? 0 0 0 0 0  Risk for fall due to : Medication side effect Medication side effect - Medication side effect Medication side effect  Follow up Falls evaluation completed;Education provided;Falls prevention discussed Falls evaluation completed;Education provided;Falls prevention discussed - Falls evaluation completed;Education provided;Falls prevention discussed Falls evaluation completed;Education provided;Falls prevention discussed    FALL RISK PREVENTION PERTAINING TO THE HOME:  Any stairs in or around the home? Yes  If so, are there any without handrails? No  Home free of loose throw rugs in walkways, pet beds, electrical cords, etc? Yes  Adequate lighting in your home to reduce risk of falls? Yes   ASSISTIVE DEVICES UTILIZED TO  PREVENT FALLS:  Life alert? No  Use of a cane, walker or w/c? No  Grab  bars in the bathroom? No  Shower chair or bench in shower? No  Elevated toilet seat or a handicapped toilet? Yes   TIMED UP AND GO:  Was the test performed? No .    Gait steady and fast without use of assistive device  Cognitive Function:     6CIT Screen 01/05/2022 08/25/2021 01/06/2021 12/04/2019 06/18/2019  What Year? 0 points 0 points 0 points 0 points 0 points  What month? 0 points 0 points 0 points 0 points 0 points  What time? 0 points 0 points 0 points 0 points 0 points  Count back from 20 0 points 0 points 0 points 0 points 0 points  Months in reverse 0 points 2 points 0 points 0 points 0 points  Repeat phrase 4 points 8 points 0 points 4 points 0 points  Total Score 4 10 0 4 0    Immunizations Immunization History  Administered Date(s) Administered   Fluad Quad(high Dose 65+) 08/26/2020   Influenza, High Dose Seasonal PF 07/22/2017   Influenza,inj,Quad PF,6+ Mos 08/02/2019   Influenza-Unspecified 08/21/2018, 08/03/2021   PFIZER(Purple Top)SARS-COV-2 Vaccination 12/10/2019, 12/30/2019, 08/17/2020, 03/01/2021, 08/03/2021   Pneumococcal Conjugate-13 09/11/2019   Pneumococcal Polysaccharide-23 11/04/2009, 10/04/2016, 12/22/2017   Zoster Recombinat (Shingrix) 07/22/2017, 12/22/2017    TDAP status: Up to date  Flu Vaccine status: Up to date  Pneumococcal vaccine status: Up to date  Covid-19 vaccine status: Completed vaccines  Qualifies for Shingles Vaccine? Yes   Zostavax completed No   Shingrix Completed?: Yes  Screening Tests Health Maintenance  Topic Date Due   OPHTHALMOLOGY EXAM  01/21/2022   HEMOGLOBIN A1C  02/23/2022   TETANUS/TDAP  02/28/2022   FOOT EXAM  03/23/2022   Pneumonia Vaccine 44+ Years old  Completed   INFLUENZA VACCINE  Completed   COVID-19 Vaccine  Completed   Zoster Vaccines- Shingrix  Completed   HPV VACCINES  Aged Out    Health Maintenance  There are no  preventive care reminders to display for this patient.  Colorectal cancer screening: No longer required.   Lung Cancer Screening: (Low Dose CT Chest recommended if Age 38-80 years, 30 pack-year currently smoking OR have quit w/in 15years.) does not qualify.   Lung Cancer Screening Referral: no  Additional Screening:  Hepatitis C Screening: does not qualify;   Vision Screening: Recommended annual ophthalmology exams for early detection of glaucoma and other disorders of the eye. Is the patient up to date with their annual eye exam?  Yes  Who is the provider or what is the name of the office in which the patient attends annual eye exams? Dr. Venetia Maxon If pt is not established with a provider, would they like to be referred to a provider to establish care? No .   Dental Screening: Recommended annual dental exams for proper oral hygiene  Community Resource Referral / Chronic Care Management: CRR required this visit?  No   CCM required this visit?  No      Plan:     I have personally reviewed and noted the following in the patients chart:   Medical and social history Use of alcohol, tobacco or illicit drugs  Current medications and supplements including opioid prescriptions. Patient is not currently taking opioid prescriptions. Functional ability and status Nutritional status Physical activity Advanced directives List of other physicians Hospitalizations, surgeries, and ER visits in previous 12 months Vitals Screenings to include cognitive, depression, and falls Referrals and appointments  In addition, I have reviewed and discussed with patient certain  preventive protocols, quality metrics, and best practice recommendations. A written personalized care plan for preventive services as well as general preventive health recommendations were provided to patient.     Kellie Simmering, LPN   11/27/1250   Nurse Notes: none

## 2022-01-06 ENCOUNTER — Encounter: Payer: Self-pay | Admitting: Internal Medicine

## 2022-01-12 ENCOUNTER — Ambulatory Visit: Payer: Medicare PPO

## 2022-01-12 ENCOUNTER — Ambulatory Visit: Payer: Medicare PPO | Admitting: Internal Medicine

## 2022-01-13 ENCOUNTER — Encounter: Payer: Self-pay | Admitting: Internal Medicine

## 2022-01-13 ENCOUNTER — Other Ambulatory Visit: Payer: Self-pay

## 2022-01-13 ENCOUNTER — Ambulatory Visit: Payer: Medicare PPO | Admitting: Internal Medicine

## 2022-01-13 VITALS — BP 110/60 | HR 71 | Temp 97.7°F | Ht 66.4 in | Wt 162.2 lb

## 2022-01-13 DIAGNOSIS — I2510 Atherosclerotic heart disease of native coronary artery without angina pectoris: Secondary | ICD-10-CM

## 2022-01-13 DIAGNOSIS — N1831 Chronic kidney disease, stage 3a: Secondary | ICD-10-CM

## 2022-01-13 DIAGNOSIS — I1310 Hypertensive heart and chronic kidney disease without heart failure, with stage 1 through stage 4 chronic kidney disease, or unspecified chronic kidney disease: Secondary | ICD-10-CM | POA: Diagnosis not present

## 2022-01-13 DIAGNOSIS — E7800 Pure hypercholesterolemia, unspecified: Secondary | ICD-10-CM

## 2022-01-13 DIAGNOSIS — E1122 Type 2 diabetes mellitus with diabetic chronic kidney disease: Secondary | ICD-10-CM | POA: Diagnosis not present

## 2022-01-13 NOTE — Patient Instructions (Signed)

## 2022-01-13 NOTE — Progress Notes (Addendum)
[name],acting as a Education administrator for [name], MD.,have documented all relevant documentation on the behalf of [name], MD,as directed by  [name], MD while in the presence of [name], MD.  This visit occurred during the SARS-CoV-2 public health emergency.  Safety protocols were in place, including screening questions prior to the visit, additional usage of staff PPE, and extensive cleaning of exam room while observing appropriate contact time as indicated for disinfecting solutions.  Subjective:     Patient ID: Jose Ponce , male    DOB: [birth year] , 81 y.o.   MRN: [id number]   Chief Complaint  Patient presents with   Diabetes   Hypertension    HPI  Pt is here today for diabetes, and blood pressure check. He reports compliance with meds. He denies headaches, chest pain and shortness of breath.   Diabetes He presents for his follow-up diabetic visit. He has type 2 diabetes mellitus. His disease course has been stable. There are no hypoglycemic associated symptoms. Pertinent negatives for hypoglycemia include no dizziness or headaches. Pertinent negatives for diabetes include no blurred vision, no fatigue, no polydipsia, no polyphagia and no polyuria. There are no hypoglycemic complications. Diabetic complications include nephropathy. Risk factors for coronary artery disease include diabetes mellitus, dyslipidemia, hypertension and male sex. He is following a diabetic diet. He participates in exercise three times a week. An ACE inhibitor/angiotensin II receptor blocker is being taken.  Hypertension This is a chronic problem. The current episode started more than 1 year ago. The problem has been gradually improving since onset. Pertinent negatives include no blurred vision or headaches. The current treatment provides moderate improvement. Compliance problems include exercise.  Hypertensive end-organ damage includes kidney disease and CAD/MI.    Past  Medical History:  Diagnosis Date   Chronic kidney disease    Coronary artery disease    Gout    Hyperlipidemia    Hypertension    Type 2 diabetes mellitus ([place])      Family History  Problem Relation Age of Onset   Asthma Mother    Heart disease Father    Stroke Father    Heart attack Father 73   Cancer Sister 61   Diabetes Brother    Heart disease Maternal Grandmother      Current Outpatient Medications:    allopurinol (ZYLOPRIM) 100 MG tablet, TAKE 1/2 TABLET BY MOUTH EVERY DAY, Disp: 45 tablet, Rfl: 1   aspirin 81 MG tablet, Take 81 mg by mouth daily., Disp: , Rfl:    Cholecalciferol 125 MCG (5000 UT) CHEW, Chew 5,000 Units by mouth. , Disp: , Rfl:    dapagliflozin propanediol (FARXIGA) 5 MG TABS tablet, Take by mouth daily., Disp: , Rfl:    Diclofenac Sodium 3 % GEL, Place onto the skin., Disp: , Rfl:    Flaxseed, Linseed, (FLAX SEEDS PO), Take 1 tablet by mouth daily., Disp: , Rfl:    ketoconazole (NIZORAL) 2 % cream, Apply 1 application topically daily., Disp: , Rfl:    lidocaine (LIDODERM) 5 %, Place 1 patch onto the skin daily. Remove & Discard patch within 12 hours or as directed by MD Place over left lower back., Disp: 30 patch, Rfl: 0   linaclotide (LINZESS) 145 MCG CAPS capsule, Take 145 mcg by mouth daily before breakfast., Disp: , Rfl:    metoprolol succinate (TOPROL-XL) 50 MG 24 hr tablet, TAKE 1 TABLET BY MOUTH EVERY DAY, Disp: 90 tablet, Rfl: 1  rosuvastatin (CRESTOR) 10 MG tablet, TAKE 1 TABLET BY MOUTH EVERY DAY, Disp: 90 tablet, Rfl: 1   Semaglutide,0.25 or 0.5MG/DOS, (OZEMPIC, 0.25 OR 0.5 MG/DOSE,) 2 MG/1.5ML SOPN, Inject 0.25 mg into the skin once a week., Disp: 3 pen, Rfl: 3   sildenafil (VIAGRA) 50 MG tablet, Take 1 tablet (50 mg total) by mouth daily as needed for erectile dysfunction., Disp: 30 tablet, Rfl: 0   telmisartan (MICARDIS) 20 MG tablet, TAKE 1 TABLET BY MOUTH EVERY DAY, Disp: 90 tablet, Rfl: 1  Current Facility-Administered Medications:     cyanocobalamin ((VITAMIN B-12)) injection 1,000 mcg, 1,000 mcg, Intramuscular, Once, Glendale Chard, MD   No Known Allergies   Review of Systems  Constitutional: Negative.  Negative for fatigue.  Eyes:  Negative for blurred vision.  Respiratory: Negative.    Cardiovascular: Negative.   Gastrointestinal: Negative.   Endocrine: Negative for polydipsia, polyphagia and polyuria.  Neurological: Negative.  Negative for dizziness and headaches.    Today's Vitals   01/13/22 1008  BP: 110/60  Pulse: 71  Temp: 97.7 F (36.5 C)  TempSrc: Oral  Weight: 162 lb 3.2 oz (73.6 kg)  Height: 5' 6.4" (1.687 m)   Body mass index is 25.87 kg/m.  Wt Readings from Last 3 Encounters:  01/13/22 162 lb 3.2 oz (73.6 kg)  01/05/22 163 lb 12.8 oz (74.3 kg)  12/14/21 162 lb (73.5 kg)    Objective:  Physical Exam Vitals and nursing note reviewed.  Constitutional:      Appearance: Normal appearance.  HENT:     Head: Normocephalic and atraumatic.     Nose:     Comments: Masked     Mouth/Throat:     Comments: Masked  Eyes:     Extraocular Movements: Extraocular movements intact.  Cardiovascular:     Rate and Rhythm: Normal rate and regular rhythm.     Heart sounds: Normal heart sounds.  Pulmonary:     Effort: Pulmonary effort is normal.     Breath sounds: Normal breath sounds.  Musculoskeletal:     Cervical back: Normal range of motion.  Skin:    General: Skin is warm.  Neurological:     General: No focal deficit present.     Mental Status: He is alert.  Psychiatric:        Mood and Affect: Mood normal.     Assessment And Plan:     1. Type 2 diabetes mellitus with stage 3a chronic kidney disease, without long-term current use of insulin (HCC) Comments: Chronic, I will check labs as below. He will c/w Ozempic and Farxiga 51m daily.  - Hemoglobin A1c - CMP14+EGFR  2. Benign hypertensive heart and renal disease Comments: Chronic, well controlled. No med changes. He will c/w metoprolol  and telmisartan. Encouraged to follow low sodium diet.   3. Coronary artery disease involving native coronary artery of native heart without angina pectoris Comments: HE is s/p CABG. I will refer him to Cardiology, Dr. ROval Linseyfor yearly f/u. CAD s/p CABG (LIMA-->LAD, SVG-->D, SVG-->OM, SVG-->LCx in 2005,  - Ambulatory referral to Cardiology  4. Pure hypercholesterolemia Comments: He is currently taking rosuvastatin 176mdaily. I will check fasting lipid panel today.   Patient was given opportunity to ask questions. Patient verbalized understanding of the plan and was able to repeat key elements of the plan. All questions were answered to their satisfaction.   I, RoMaximino GreenlandMD, have reviewed all documentation for this visit. The documentation on 01/13/22 for the exam,  diagnosis, procedures, and orders are all accurate and complete.   IF YOU HAVE BEEN REFERRED TO A SPECIALIST, IT MAY TAKE 1-2 WEEKS TO SCHEDULE/PROCESS THE REFERRAL. IF YOU HAVE NOT HEARD FROM US/SPECIALIST IN TWO WEEKS, PLEASE GIVE [place] A CALL AT [phone] X [phone].   THE PATIENT IS ENCOURAGED TO PRACTICE SOCIAL DISTANCING DUE TO THE COVID-19 PANDEMIC.

## 2022-01-14 LAB — CMP14+EGFR
ALT: 23 IU/L (ref 0–44)
AST: 40 IU/L (ref 0–40)
Albumin/Globulin Ratio: 1.8 (ref 1.2–2.2)
Albumin: 4.2 g/dL (ref 3.7–4.7)
Alkaline Phosphatase: 68 IU/L (ref 44–121)
BUN/Creatinine Ratio: 13 (ref 10–24)
BUN: 18 mg/dL (ref 8–27)
Bilirubin Total: 0.6 mg/dL (ref 0.0–1.2)
CO2: 22 mmol/L (ref 20–29)
Calcium: 9.2 mg/dL (ref 8.6–10.2)
Chloride: 104 mmol/L (ref 96–106)
Creatinine, Ser: 1.36 mg/dL — ABNORMAL HIGH (ref 0.76–1.27)
Globulin, Total: 2.4 g/dL (ref 1.5–4.5)
Glucose: 149 mg/dL — ABNORMAL HIGH (ref 70–99)
Potassium: 4.2 mmol/L (ref 3.5–5.2)
Sodium: 143 mmol/L (ref 134–144)
Total Protein: 6.6 g/dL (ref 6.0–8.5)
eGFR: 53 mL/min/{1.73_m2} — ABNORMAL LOW (ref >59)

## 2022-01-14 LAB — HEMOGLOBIN A1C
Est. average glucose Bld gHb Est-mCnc: 166 mg/dL
Hgb A1c MFr Bld: 7.4 % — ABNORMAL HIGH (ref 4.8–5.6)

## 2022-01-19 ENCOUNTER — Telehealth: Payer: Self-pay

## 2022-01-19 NOTE — Chronic Care Management (AMB) (Signed)
? ? ?  Chronic Care Management ?Pharmacy Assistant  ? ?Name: RAYSHON ALBAUGH  MRN: [id number] DOB: 05-12-1941 ? ? ?Reason for Encounter: Prior Authorization/ Patient assistance ? ?01/19/2022- Prior authorization started for Farxiga through Covermymeds. Before I was able to complete form, notification that PA is not required for [place]. Calling [facility] to inform. Spoke with [facility], they do not have a prescription on file for patient. Spoke with Orlando Penner, she would like patient to receive Patient assistance application for Farxiga. Mailing application to patient.  ? ?Medications: ?Outpatient Encounter Medications as of 01/19/2022  ?Medication Sig  ? allopurinol (ZYLOPRIM) 100 MG tablet TAKE 1/2 TABLET BY MOUTH EVERY DAY  ? aspirin 81 MG tablet Take 81 mg by mouth daily.  ? Cholecalciferol 125 MCG (5000 UT) CHEW Chew 5,000 Units by mouth.   ? dapagliflozin propanediol (FARXIGA) 5 MG TABS tablet Take by mouth daily.  ? Diclofenac Sodium 3 % GEL Place onto the skin.  ? Flaxseed, Linseed, (FLAX SEEDS PO) Take 1 tablet by mouth daily.  ? ketoconazole (NIZORAL) 2 % cream Apply 1 application topically daily.  ? lidocaine (LIDODERM) 5 % Place 1 patch onto the skin daily. Remove & Discard patch within 12 hours or as directed by MD ?Place over left lower back.  ? linaclotide (LINZESS) 145 MCG CAPS capsule Take 145 mcg by mouth daily before breakfast.  ? metoprolol succinate (TOPROL-XL) 50 MG 24 hr tablet TAKE 1 TABLET BY MOUTH EVERY DAY  ? rosuvastatin (CRESTOR) 10 MG tablet TAKE 1 TABLET BY MOUTH EVERY DAY  ? Semaglutide,0.25 or 0.5MG /DOS, (OZEMPIC, 0.25 OR 0.5 MG/DOSE,) 2 MG/1.5ML SOPN Inject 0.25 mg into the skin once a week.  ? sildenafil (VIAGRA) 50 MG tablet Take 1 tablet (50 mg total) by mouth daily as needed for erectile dysfunction.  ? telmisartan (MICARDIS) 20 MG tablet TAKE 1 TABLET BY MOUTH EVERY DAY  ? ?Facility-Administered Encounter Medications as of 01/19/2022  ?Medication  ? cyanocobalamin ((VITAMIN B-12))  injection 1,000 mcg  ? ? ?Pattricia Boss, CMA ?Clinical Pharmacist Assistant ?[phone] ? ?

## 2022-03-07 ENCOUNTER — Encounter (HOSPITAL_BASED_OUTPATIENT_CLINIC_OR_DEPARTMENT_OTHER): Payer: Self-pay | Admitting: Cardiovascular Disease

## 2022-03-07 ENCOUNTER — Ambulatory Visit (HOSPITAL_BASED_OUTPATIENT_CLINIC_OR_DEPARTMENT_OTHER): Payer: Medicare PPO | Admitting: Cardiovascular Disease

## 2022-03-07 VITALS — BP 120/78 | HR 58 | Ht 66.4 in | Wt 160.7 lb

## 2022-03-07 DIAGNOSIS — I2510 Atherosclerotic heart disease of native coronary artery without angina pectoris: Secondary | ICD-10-CM | POA: Diagnosis not present

## 2022-03-07 DIAGNOSIS — I10 Essential (primary) hypertension: Secondary | ICD-10-CM | POA: Diagnosis not present

## 2022-03-07 DIAGNOSIS — E7800 Pure hypercholesterolemia, unspecified: Secondary | ICD-10-CM

## 2022-03-07 NOTE — Assessment & Plan Note (Signed)
Blood pressure is very well-controlled.  Recommended that he work on increasing his exercise to at least 150 minutes weekly.  Continue metoprolol and telmisartan.  ?

## 2022-03-07 NOTE — Assessment & Plan Note (Signed)
Continue rosuvastatin.  Lipids are well-controlled.  LDL goal is <70.  He is doing well with diet other than carbohydrates.   ?

## 2022-03-07 NOTE — Assessment & Plan Note (Signed)
S/p CABG in 2005.  He has no angina and is doing well.  Lipids are well-controlled.  Continue aspirin, metoprolol, and rosuvastatin. ?

## 2022-03-07 NOTE — Patient Instructions (Signed)
Medication Instructions:  ?Your Physician recommend you continue on your current medication as directed.   ? ?*If you need a refill on your cardiac medications before your next appointment, please call your pharmacy* ? ? ?Lab Work: ?None ordered today ? ? ?Testing/Procedures: ?None ordered today ? ? ?Follow-Up: ?At [facility], you and your health needs are our priority.  As part of our continuing mission to provide you with exceptional heart care, we have created designated Provider Care Teams.  These Care Teams include your primary Cardiologist (physician) and Advanced Practice Providers (APPs -  Physician Assistants and Nurse Practitioners) who all work together to provide you with the care you need, when you need it. ? ?We recommend signing up for the patient portal called "MyChart".  Sign up information is provided on this After Visit Summary.  MyChart is used to connect with patients for Virtual Visits (Telemedicine).  Patients are able to view lab/test results, encounter notes, upcoming appointments, etc.  Non-urgent messages can be sent to your provider as well.   ?To learn more about what you can do with MyChart, go to [email or web address].   ? ?Your next appointment:   ?1 year(s) ? ?The format for your next appointment:   ?In Person ? ?Provider:   ?Skeet Latch, MD{ ? ? ?Important Information About Sugar ? ? ? ? ? ? ?

## 2022-03-07 NOTE — Progress Notes (Signed)
?Cardiology Office Note:   ? ?Date:  03/07/2022  ? ?ID:  BROGEN DUELL, DOB Feb 26, 1941, MRN [id number] ? ?PCP:  Glendale Chard, MD ?  ?[facility] Providers ?Cardiologist:  Quay Burow, MD    ? ?Referring MD: Glendale Chard, MD  ? ?No chief complaint on file. ? ? ?History of Present Illness:   ? ?Jose Ponce is a 81 y.o. male with CAD s/p CABG (LIMA-->LAD, SVG-->D, SVG-->OM, SVG-->LCx in 2005), hypertension, hyperlipidemia, and DM who presents for follow up.  He last had a Lexiscan Myoview 05/2011 that revealed LVEF 57% and was negative for ischemia.  Prior to his CABG he noted his gait was unstable.  He was referred for a stress thest that was abnormal and subsequent cath showed obstructive CAD.  He underwent CABG in 2005.  Since that time he has been feeling well.  He has no chest pain or shortness of breath.  He does not get much formal exercise but is very active.  He denies lower extremity edema, orthopnea, or PND.  He also has not noted any lightheadedness or dizziness.  He was diagnosed with prostate cancer 5 years ago and had radioactive seeds implanted.  His PSA has been well-controlled since that time.  He quit smoking in 1986 after a 10 year history of smoking less than 1 ppd.  ?  ?Today, he reports he has been feeling fine overall. Generally he is not aware of any current cardiovascular issues. At times he will experience indigestion, but is not sure if the discomfort is related to his heart. Usually this occurs after eating certain foods, and never while exercising. This year he has also suffered from persistent post-nasal drip and sinus congestion, which he describes as a "pulsating" sensation in his left temple. He is walking routinely, but has noticed a gradual decrease in his energy levels. He states he is not participating in much formal exercise. Regarding his diet, his main struggle is eating too many starches. He enjoys rice, red potatoes, and macaroni and cheese. He denies any  palpitations, chest pain, shortness of breath, or peripheral edema. No lightheadedness, headaches, syncope, orthopnea, or PND. ? ? ?Past Medical History:  ?Diagnosis Date  ? Chronic kidney disease   ? Coronary artery disease   ? Gout   ? Hyperlipidemia   ? Hypertension   ? Type 2 diabetes mellitus ([place])   ? ? ?Past Surgical History:  ?Procedure Laterality Date  ? CARDIAC CATHETERIZATION  01/16/2004  ? multi-vessel coronary disease 40-50% Left main, 100% mid LAD75-80% ostial stenosis  ? CARDIOVASCULAR STRESS TEST  08/09/2004  ? post CABG, low risk scan  ? CARDIOVASCULAR STRESS TEST  10/23/2003  ? mild septal ischemia, EF43%,   ? CORONARY ARTERY BYPASS GRAFT  02/05/2004  ? CABGx3 Dr.Bartle  ? Lower Ext. Doppler  10/20/2011  ? Right Anterior Tibial: occlusive disease, Left Anterior Tibial occluded w/reconstitution at ankle, mildly abnormal LEA  ? NM MYOCAR PERF WALL MOTION  06/07/2011  ? protocol:Persantine, EF60%, negative for ischemia, low risk scan  ? NM MYOCAR PERF WALL MOTION  08/05/2009  ? protocol: Persantine, EF57%. no evidence of ischemia, low risk scan.   ? TRANSPERINEAL IMPLANT OF RADIATION SEEDS W/ ULTRASOUND    ? ? ?Current Medications: ?Current Meds  ?Medication Sig  ? allopurinol (ZYLOPRIM) 100 MG tablet TAKE 1/2 TABLET BY MOUTH EVERY DAY  ? aspirin 81 MG tablet Take 81 mg by mouth daily.  ? Cholecalciferol 125 MCG (5000 UT) CHEW Chew 5,000  Units by mouth.   ? dapagliflozin propanediol (FARXIGA) 5 MG TABS tablet Take by mouth daily.  ? Diclofenac Sodium 3 % GEL Place onto the skin.  ? Flaxseed, Linseed, (FLAX SEEDS PO) Take 1 tablet by mouth daily.  ? ketoconazole (NIZORAL) 2 % cream Apply 1 application topically daily.  ? lidocaine (LIDODERM) 5 % Place 1 patch onto the skin daily. Remove & Discard patch within 12 hours or as directed by MD ?Place over left lower back.  ? linaclotide (LINZESS) 145 MCG CAPS capsule Take 145 mcg by mouth daily before breakfast.  ? metoprolol succinate (TOPROL-XL) 50 MG 24  hr tablet TAKE 1 TABLET BY MOUTH EVERY DAY  ? rosuvastatin (CRESTOR) 10 MG tablet TAKE 1 TABLET BY MOUTH EVERY DAY  ? Semaglutide,0.25 or 0.'5MG'$ /DOS, (OZEMPIC, 0.25 OR 0.5 MG/DOSE,) 2 MG/1.5ML SOPN Inject 0.25 mg into the skin once a week.  ? sildenafil (VIAGRA) 50 MG tablet Take 1 tablet (50 mg total) by mouth daily as needed for erectile dysfunction.  ? telmisartan (MICARDIS) 20 MG tablet TAKE 1 TABLET BY MOUTH EVERY DAY  ? ?Current Facility-Administered Medications for the 03/07/22 encounter (Office Visit) with Skeet Latch, MD  ?Medication  ? cyanocobalamin ((VITAMIN B-12)) injection 1,000 mcg  ?  ? ?Allergies:   Patient has no known allergies.  ? ?Social History  ? ?Socioeconomic History  ? Marital status: Divorced  ?  Spouse name: Not on file  ? Number of children: Not on file  ? Years of education: Not on file  ? Highest education level: Not on file  ?Occupational History  ? Occupation: retired  ?Tobacco Use  ? Smoking status: Former  ?  Packs/day: 0.25  ?  Years: 20.00  ?  Pack years: 5.00  ?  Types: Cigarettes  ?  Quit date: 11/21/1984  ?  Years since quitting: 37.3  ? Smokeless tobacco: Never  ?Vaping Use  ? Vaping Use: Never used  ?Substance and Sexual Activity  ? Alcohol use: Not Currently  ? Drug use: Not Currently  ? Sexual activity: Yes  ?Other Topics Concern  ? Not on file  ?Social History Narrative  ? Not on file  ? ?Social Determinants of Health  ? ?Financial Resource Strain: Low Risk   ? Difficulty of Paying Living Expenses: Not hard at all  ?Food Insecurity: No Food Insecurity  ? Worried About Charity fundraiser in the Last Year: Never true  ? Ran Out of Food in the Last Year: Never true  ?Transportation Needs: No Transportation Needs  ? Lack of Transportation (Medical): No  ? Lack of Transportation (Non-Medical): No  ?Physical Activity: Insufficiently Active  ? Days of Exercise per Week: 3 days  ? Minutes of Exercise per Session: 20 min  ?Stress: No Stress Concern Present  ? Feeling of Stress  : Not at all  ?Social Connections: Moderately Integrated  ? Frequency of Communication with Friends and Family: More than three times a week  ? Frequency of Social Gatherings with Friends and Family: More than three times a week  ? Attends Religious Services: More than 4 times per year  ? Active Member of Clubs or Organizations: Yes  ? Attends Archivist Meetings: 1 to 4 times per year  ? Marital Status: Divorced  ?  ? ?Family History: ?The patient's family history includes Asthma in his mother; Cancer (age of onset: 42) in his sister; Diabetes in his brother; Heart attack (age of onset: 80) in his father; Heart  disease in his father and maternal grandmother; Stroke in his father. ? ?ROS:   ?Please see the history of present illness.    ?(+) Indigestion ?(+) Post-nasal drip ?(+) Sinus congestion ?(+) Fatigue ?All other systems reviewed and are negative. ? ?EKGs/Labs/Other Studies Reviewed:   ? ?The following studies were reviewed today: ?No recent CV studies. ? ?EKG:   EKG is personally reviewed. ?03/07/2022: Sinus brady. Rate 58 bpm. RBBB. LAD. ?08/02/2018: Sinus bradycardia.  Rate 56 bpm.  Right bundle branch block.  LAFB. ?11/23/17: sinus bradycardia.  Rate 52 bpm.  RBBB.  First degree AV block.   ? ?Recent Labs: ?03/23/2021: Hemoglobin 15.3; Platelets 187 ?01/13/2022: ALT 23; BUN 18; Creatinine, Ser 1.36; Potassium 4.2; Sodium 143  ? ?Recent Lipid Panel ?   ?Component Value Date/Time  ? CHOL 129 03/23/2021 1251  ? TRIG 53 03/23/2021 1251  ? HDL 54 03/23/2021 1251  ? CHOLHDL 2.4 03/23/2021 1251  ? [place] 63 03/23/2021 1251  ? ? ?Physical Exam:   ? ?Wt Readings from Last 3 Encounters:  ?03/07/22 160 lb 11.2 oz (72.9 kg)  ?01/13/22 162 lb 3.2 oz (73.6 kg)  ?01/05/22 163 lb 12.8 oz (74.3 kg)  ?  ? ?VS:  BP 120/78 (BP Location: Left Arm, Patient Position: Sitting, Cuff Size: Normal)   Pulse (!) 58   Ht 5' 6.4" (1.687 m)   Wt 160 lb 11.2 oz (72.9 kg)   BMI 25.63 kg/m?  , BMI Body mass index is 25.63  kg/m?. ?GENERAL:  Well appearing ?HEENT: Pupils equal round and reactive, fundi not visualized, oral mucosa unremarkable ?NECK:  No jugular venous distention, waveform within normal limits, carotid upstroke brisk and symmetri

## 2022-03-09 ENCOUNTER — Telehealth: Payer: Self-pay

## 2022-03-18 ENCOUNTER — Other Ambulatory Visit: Payer: Self-pay | Admitting: Internal Medicine

## 2022-03-18 NOTE — Chronic Care Management (AMB) (Signed)
? ? ?  Chronic Care Management ?Pharmacy Assistant  ? ?Name: Jose Ponce  MRN: [id number] DOB: [birth year] ? ? ?Reason for Encounter: Patient Assistance Coordination ? ?03/09/2022- [employer] patient assistance program to check on the status of patient's application for Ozempic. Spoke with representative they are needing updated income from patient in order to complete application. ? ?03/18/2022- Called patient to inform, no answer, left message to bring income to next office visit with Dr Baird Cancer on 03/24/2022 and may return my call with any questions. ? ? ?Medications: ?Outpatient Encounter Medications as of 03/09/2022  ?Medication Sig  ? allopurinol (ZYLOPRIM) 100 MG tablet TAKE 1/2 TABLET BY MOUTH EVERY DAY  ? aspirin 81 MG tablet Take 81 mg by mouth daily.  ? Cholecalciferol 125 MCG (5000 UT) CHEW Chew 5,000 Units by mouth.   ? dapagliflozin propanediol (FARXIGA) 5 MG TABS tablet Take by mouth daily.  ? Diclofenac Sodium 3 % GEL Place onto the skin.  ? Flaxseed, Linseed, (FLAX SEEDS PO) Take 1 tablet by mouth daily.  ? ketoconazole (NIZORAL) 2 % cream Apply 1 application topically daily.  ? lidocaine (LIDODERM) 5 % Place 1 patch onto the skin daily. Remove & Discard patch within 12 hours or as directed by MD ?Place over left lower back.  ? linaclotide (LINZESS) 145 MCG CAPS capsule Take 145 mcg by mouth daily before breakfast.  ? metoprolol succinate (TOPROL-XL) 50 MG 24 hr tablet TAKE 1 TABLET BY MOUTH EVERY DAY  ? rosuvastatin (CRESTOR) 10 MG tablet TAKE 1 TABLET BY MOUTH EVERY DAY  ? Semaglutide,0.25 or 0.'5MG'$ /DOS, (OZEMPIC, 0.25 OR 0.5 MG/DOSE,) 2 MG/1.5ML SOPN Inject 0.25 mg into the skin once a week.  ? sildenafil (VIAGRA) 50 MG tablet Take 1 tablet (50 mg total) by mouth daily as needed for erectile dysfunction.  ? telmisartan (MICARDIS) 20 MG tablet TAKE 1 TABLET BY MOUTH EVERY DAY  ? ?Facility-Administered Encounter Medications as of 03/09/2022  ?Medication  ? cyanocobalamin ((VITAMIN B-12)) injection  1,000 mcg  ? ? ?Pattricia Boss, CMA ?Clinical Pharmacist Assistant ?[phone] ? ?

## 2022-03-24 ENCOUNTER — Encounter: Payer: Self-pay | Admitting: Internal Medicine

## 2022-03-24 ENCOUNTER — Ambulatory Visit (INDEPENDENT_AMBULATORY_CARE_PROVIDER_SITE_OTHER): Payer: Medicare PPO | Admitting: Internal Medicine

## 2022-03-24 VITALS — BP 114/84 | HR 57 | Temp 97.6°F | Ht 66.0 in | Wt 163.0 lb

## 2022-03-24 DIAGNOSIS — E7800 Pure hypercholesterolemia, unspecified: Secondary | ICD-10-CM | POA: Diagnosis not present

## 2022-03-24 DIAGNOSIS — Z6826 Body mass index (BMI) 26.0-26.9, adult: Secondary | ICD-10-CM | POA: Diagnosis not present

## 2022-03-24 DIAGNOSIS — E1122 Type 2 diabetes mellitus with diabetic chronic kidney disease: Secondary | ICD-10-CM | POA: Diagnosis not present

## 2022-03-24 DIAGNOSIS — Z23 Encounter for immunization: Secondary | ICD-10-CM | POA: Diagnosis not present

## 2022-03-24 DIAGNOSIS — I1310 Hypertensive heart and chronic kidney disease without heart failure, with stage 1 through stage 4 chronic kidney disease, or unspecified chronic kidney disease: Secondary | ICD-10-CM

## 2022-03-24 DIAGNOSIS — Z0000 Encounter for general adult medical examination without abnormal findings: Secondary | ICD-10-CM

## 2022-03-24 DIAGNOSIS — I2510 Atherosclerotic heart disease of native coronary artery without angina pectoris: Secondary | ICD-10-CM | POA: Diagnosis not present

## 2022-03-24 DIAGNOSIS — N1831 Chronic kidney disease, stage 3a: Secondary | ICD-10-CM

## 2022-03-24 LAB — POCT URINALYSIS DIPSTICK
Bilirubin, UA: NEGATIVE
Blood, UA: NEGATIVE
Glucose, UA: POSITIVE — AB
Ketones, UA: NEGATIVE
Leukocytes, UA: NEGATIVE
Nitrite, UA: NEGATIVE
Protein, UA: NEGATIVE
Spec Grav, UA: 1.015 (ref 1.010–1.025)
Urobilinogen, UA: 1 E.U./dL
pH, UA: 7 (ref 5.0–8.0)

## 2022-03-24 MED ORDER — TRIAMCINOLONE ACETONIDE 0.1 % EX CREA: TOPICAL_CREAM | CUTANEOUS | 0 refills | Status: AC

## 2022-03-24 NOTE — Patient Instructions (Signed)
Health Maintenance, Male Adopting a healthy lifestyle and getting preventive care are important in promoting health and wellness. Ask your health care provider about: The right schedule for you to have regular tests and exams. Things you can do on your own to prevent diseases and keep yourself healthy. What should I know about diet, weight, and exercise? Eat a healthy diet  Eat a diet that includes plenty of vegetables, fruits, low-fat dairy products, and lean protein. Do not eat a lot of foods that are high in solid fats, added sugars, or sodium. Maintain a healthy weight Body mass index (BMI) is a measurement that can be used to identify possible weight problems. It estimates body fat based on height and weight. Your health care provider can help determine your BMI and help you achieve or maintain a healthy weight. Get regular exercise Get regular exercise. This is one of the most important things you can do for your health. Most adults should: Exercise for at least 150 minutes each week. The exercise should increase your heart rate and make you sweat (moderate-intensity exercise). Do strengthening exercises at least twice a week. This is in addition to the moderate-intensity exercise. Spend less time sitting. Even light physical activity can be beneficial. Watch cholesterol and blood lipids Have your blood tested for lipids and cholesterol at 80 years of age, then have this test every 5 years. You may need to have your cholesterol levels checked more often if: Your lipid or cholesterol levels are high. You are older than 80 years of age. You are at high risk for heart disease. What should I know about cancer screening? Many types of cancers can be detected early and may often be prevented. Depending on your health history and family history, you may need to have cancer screening at various ages. This may include screening for: Colorectal cancer. Prostate cancer. Skin cancer. Lung  cancer. What should I know about heart disease, diabetes, and high blood pressure? Blood pressure and heart disease High blood pressure causes heart disease and increases the risk of stroke. This is more likely to develop in people who have high blood pressure readings or are overweight. Talk with your health care provider about your target blood pressure readings. Have your blood pressure checked: Every 3-5 years if you are 18-39 years of age. Every year if you are 40 years old or older. If you are between the ages of 65 and 75 and are a current or former smoker, ask your health care provider if you should have a one-time screening for abdominal aortic aneurysm (AAA). Diabetes Have regular diabetes screenings. This checks your fasting blood sugar level. Have the screening done: Once every three years after age 45 if you are at a normal weight and have a low risk for diabetes. More often and at a younger age if you are overweight or have a high risk for diabetes. What should I know about preventing infection? Hepatitis B If you have a higher risk for hepatitis B, you should be screened for this virus. Talk with your health care provider to find out if you are at risk for hepatitis B infection. Hepatitis C Blood testing is recommended for: Everyone born from 1945 through 1965. Anyone with known risk factors for hepatitis C. Sexually transmitted infections (STIs) You should be screened each year for STIs, including gonorrhea and chlamydia, if: You are sexually active and are younger than 80 years of age. You are older than 80 years of age and your   health care provider tells you that you are at risk for this type of infection. Your sexual activity has changed since you were last screened, and you are at increased risk for chlamydia or gonorrhea. Ask your health care provider if you are at risk. Ask your health care provider about whether you are at high risk for HIV. Your health care provider  may recommend a prescription medicine to help prevent HIV infection. If you choose to take medicine to prevent HIV, you should first get tested for HIV. You should then be tested every 3 months for as long as you are taking the medicine. Follow these instructions at home: Alcohol use Do not drink alcohol if your health care provider tells you not to drink. If you drink alcohol: Limit how much you have to 0-2 drinks a day. Know how much alcohol is in your drink. In the [place] one drink equals one 12 oz bottle of beer (355 mL), one 5 oz glass of wine (148 mL), or one 1 oz glass of hard liquor (44 mL). Lifestyle Do not use any products that contain nicotine or tobacco. These products include cigarettes, chewing tobacco, and vaping devices, such as e-cigarettes. If you need help quitting, ask your health care provider. Do not use street drugs. Do not share needles. Ask your health care provider for help if you need support or information about quitting drugs. General instructions Schedule regular health, dental, and eye exams. Stay current with your vaccines. Tell your health care provider if: You often feel depressed. You have ever been abused or do not feel safe at home. Summary Adopting a healthy lifestyle and getting preventive care are important in promoting health and wellness. Follow your health care provider's instructions about healthy diet, exercising, and getting tested or screened for diseases. Follow your health care provider's instructions on monitoring your cholesterol and blood pressure. This information is not intended to replace advice given to you by your health care provider. Make sure you discuss any questions you have with your health care provider. Document Revised: 03/29/2021 Document Reviewed: 03/29/2021 Elsevier Patient Education  2023 [employer]  

## 2022-03-24 NOTE — Progress Notes (Signed)
?Jose Ponce,acting as a Education administrator for Jose Greenland, MD.,have documented all relevant documentation on the behalf of Jose Greenland, MD,as directed by  Jose Greenland, MD while in the presence of Jose Greenland, MD.  ?This visit occurred during the SARS-CoV-2 public health emergency.  Safety protocols were in place, including screening questions prior to the visit, additional usage of staff PPE, and extensive cleaning of exam room while observing appropriate contact time as indicated for disinfecting solutions. ? ?Subjective:  ?  ? Patient ID: Jose Ponce , male    DOB: [birth year] , 81 y.o.   MRN: [id number] ? ? ?Chief Complaint  ?Patient presents with  ? Annual Exam  ? ? ?HPI ? ?He is here today for a full physical examination. Reports compliance with meds, no concerns today.  He denies headaches, chest pain and shortness of breath.  ? ?Diabetes ?He presents for his follow-up diabetic visit. He has type 2 diabetes mellitus. His disease course has been stable. There are no hypoglycemic associated symptoms. Pertinent negatives for hypoglycemia include no headaches. Pertinent negatives for diabetes include no blurred vision and no chest pain. There are no hypoglycemic complications. Diabetic complications include nephropathy. Risk factors for coronary artery disease include diabetes mellitus, dyslipidemia, hypertension and male sex. He participates in exercise intermittently. An ACE inhibitor/angiotensin II receptor blocker is being taken. Eye exam is current.  ?Hypertension ?This is a chronic problem. The current episode started more than 1 year ago. The problem has been gradually improving since onset. Pertinent negatives include no blurred vision, chest pain, headaches, palpitations or shortness of breath. Risk factors for coronary artery disease include diabetes mellitus, dyslipidemia and male gender. The current treatment provides moderate improvement. Compliance problems include exercise.   Hypertensive end-organ damage includes CAD/MI.   ? ?Past Medical History:  ?Diagnosis Date  ? Chronic kidney disease   ? Coronary artery disease   ? Gout   ? Hyperlipidemia   ? Hypertension   ? Type 2 diabetes mellitus ([place])   ?  ? ?Family History  ?Problem Relation Age of Onset  ? Asthma Mother   ? Heart disease Father   ? Stroke Father   ? Heart attack Father 67  ? Cancer Sister 22  ? Diabetes Brother   ? Heart disease Maternal Grandmother   ? ? ? ?Current Outpatient Medications:  ?  allopurinol (ZYLOPRIM) 100 MG tablet, TAKE 1/2 TABLET BY MOUTH EVERY DAY, Disp: 45 tablet, Rfl: 1 ?  aspirin 81 MG tablet, Take 81 mg by mouth daily., Disp: , Rfl:  ?  Cholecalciferol 125 MCG (5000 UT) CHEW, Chew 5,000 Units by mouth. , Disp: , Rfl:  ?  dapagliflozin propanediol (FARXIGA) 5 MG TABS tablet, Take by mouth daily., Disp: , Rfl:  ?  Diclofenac Sodium 3 % GEL, Place onto the skin., Disp: , Rfl:  ?  Flaxseed, Linseed, (FLAX SEEDS PO), Take 1 tablet by mouth daily., Disp: , Rfl:  ?  ketoconazole (NIZORAL) 2 % cream, Apply 1 application topically daily., Disp: , Rfl:  ?  lidocaine (LIDODERM) 5 %, Place 1 patch onto the skin daily. Remove & Discard patch within 12 hours or as directed by MD Place over left lower back., Disp: 30 patch, Rfl: 0 ?  linaclotide (LINZESS) 145 MCG CAPS capsule, Take 145 mcg by mouth daily before breakfast., Disp: , Rfl:  ?  metoprolol succinate (TOPROL-XL) 50 MG 24 hr tablet, TAKE 1 TABLET BY MOUTH EVERY DAY, Disp: 90 tablet,  Rfl: 1 ?  rosuvastatin (CRESTOR) 10 MG tablet, TAKE 1 TABLET BY MOUTH EVERY DAY, Disp: 90 tablet, Rfl: 1 ?  Semaglutide,0.25 or 0.'5MG'$ /DOS, (OZEMPIC, 0.25 OR 0.5 MG/DOSE,) 2 MG/1.5ML SOPN, Inject 0.25 mg into the skin once a week., Disp: 3 pen, Rfl: 3 ?  sildenafil (VIAGRA) 50 MG tablet, Take 1 tablet (50 mg total) by mouth daily as needed for erectile dysfunction., Disp: 30 tablet, Rfl: 0 ?  telmisartan (MICARDIS) 20 MG tablet, TAKE 1 TABLET BY MOUTH EVERY DAY, Disp: 90 tablet,  Rfl: 1 ?  triamcinolone cream (KENALOG) 0.1 %, APPLY TO AFFECTED AREA on hand TWICE DAILY AS NEEDED, Disp: 45 g, Rfl: 0 ? ?Current Facility-Administered Medications:  ?  cyanocobalamin ((VITAMIN B-12)) injection 1,000 mcg, 1,000 mcg, Intramuscular, Once, Jose Chard, MD  ? ?No Known Allergies  ? ?Men's preventive visit. Patient Health Questionnaire (PHQ-2) is  ?Flowsheet Row Clinical Support from 01/05/2022 in [facility]  ?PHQ-2 Total Score 0  ? ?  ?. Patient is on a heart healthy diet. Marital status: Divorced. Relevant history for alcohol use is:  ?Social History  ? ?Substance and Sexual Activity  ?Alcohol Use Not Currently  ?[name] Relevant history for tobacco use is:  ?Social History  ? ?Tobacco Use  ?Smoking Status Former  ? Packs/day: 0.25  ? Years: 20.00  ? Pack years: 5.00  ? Types: Cigarettes  ? Quit date: 11/21/1984  ? Years since quitting: 37.3  ?Smokeless Tobacco Never  ?.  ? ?Review of Systems  ?Constitutional: Negative.   ?HENT: Negative.    ?Eyes: Negative.  Negative for blurred vision.  ?Respiratory: Negative.  Negative for shortness of breath.   ?Cardiovascular: Negative.  Negative for chest pain and palpitations.  ?Gastrointestinal: Negative.   ?Endocrine: Negative.   ?Genitourinary: Negative.   ?Musculoskeletal: Negative.   ?Skin: Negative.   ?Neurological: Negative.  Negative for headaches.  ?Hematological: Negative.   ?Psychiatric/Behavioral: Negative.     ? ?Today's Vitals  ? 03/24/22 1415  ?BP: 114/84  ?Pulse: (!) 57  ?Temp: 97.6 ?F (36.4 ?C)  ?Weight: 163 lb (73.9 kg)  ?Height: '5\' 6"'$  (1.676 m)  ?PainSc: 0-No pain  ? ?Body mass index is 26.31 kg/m?.  ?Wt Readings from Last 3 Encounters:  ?03/24/22 163 lb (73.9 kg)  ?03/07/22 160 lb 11.2 oz (72.9 kg)  ?01/13/22 162 lb 3.2 oz (73.6 kg)  ?  ? ?Objective:  ?Physical Exam ?Vitals and nursing note reviewed.  ?Constitutional:   ?   Appearance: Normal appearance.  ?HENT:  ?   Head: Normocephalic and atraumatic.  ?   Right Ear:  Tympanic membrane, ear canal and external ear normal.  ?   Left Ear: Tympanic membrane, ear canal and external ear normal.  ?   Nose: Nose normal.  ?   Mouth/Throat:  ?   Mouth: Mucous membranes are moist.  ?   Pharynx: Oropharynx is clear.  ?Eyes:  ?   Extraocular Movements: Extraocular movements intact.  ?   Conjunctiva/sclera: Conjunctivae normal.  ?   Pupils: Pupils are equal, round, and reactive to light.  ?Cardiovascular:  ?   Rate and Rhythm: Normal rate and regular rhythm.  ?   Pulses: Normal pulses.     ?     Dorsalis pedis pulses are 2+ on the right side and 2+ on the left side.  ?   Heart sounds: Normal heart sounds.  ?Pulmonary:  ?   Effort: Pulmonary effort is normal.  ?  Breath sounds: Normal breath sounds.  ?Chest:  ?Breasts: ?   Right: Normal. No swelling, bleeding, inverted nipple, mass or nipple discharge.  ?   Left: Normal. No swelling, bleeding, inverted nipple, mass or nipple discharge.  ?Abdominal:  ?   General: Bowel sounds are normal.  ?   Palpations: Abdomen is soft.  ?Genitourinary: ?   Comments: Deferred  ?Musculoskeletal:     ?   General: Normal range of motion.  ?   Cervical back: Normal range of motion and neck supple.  ?Feet:  ?   Right foot:  ?   Protective Sensation: 5 sites tested.  5 sites sensed.  ?   Skin integrity: Dry skin present.  ?   Toenail Condition: Right toenails are normal.  ?   Left foot:  ?   Protective Sensation: 5 sites tested.  5 sites sensed.  ?   Skin integrity: Dry skin present.  ?   Toenail Condition: Left toenails are normal.  ?Skin: ?   General: Skin is warm.  ?Neurological:  ?   General: No focal deficit present.  ?   Mental Status: He is alert.  ?Psychiatric:     ?   Mood and Affect: Mood normal.     ?   Behavior: Behavior normal.  ?   ?Assessment And Plan:  ?  ?1. Encounter for general adult medical examination w/o abnormal findings ?Comments: A full exam was performed, DRE deferred - as per patient request. He is also followed by Urology for his prostate  exams. PATIENT IS ADVISED TO GET 30-45 MINUTES REGULAR EXERCISE NO LESS THAN FOUR TO FIVE DAYS PER WEEK - BOTH WEIGHTBEARING EXERCISES AND AEROBIC ARE RECOMMENDED.  PATIENT IS ADVISED TO FOLLOW A HEALTHY DIET WITH

## 2022-03-25 LAB — BMP8+EGFR
BUN/Creatinine Ratio: 13 (ref 10–24)
BUN: 20 mg/dL (ref 8–27)
CO2: 24 mmol/L (ref 20–29)
Calcium: 9.2 mg/dL (ref 8.6–10.2)
Chloride: 107 mmol/L — ABNORMAL HIGH (ref 96–106)
Creatinine, Ser: 1.5 mg/dL — ABNORMAL HIGH (ref 0.76–1.27)
Glucose: 218 mg/dL — ABNORMAL HIGH (ref 70–99)
Potassium: 4.4 mmol/L (ref 3.5–5.2)
Sodium: 148 mmol/L — ABNORMAL HIGH (ref 134–144)
eGFR: 47 mL/min/{1.73_m2} — ABNORMAL LOW (ref >59)

## 2022-03-25 LAB — CBC
Hematocrit: 45.9 % (ref 37.5–51.0)
Hemoglobin: 16 g/dL (ref 13.0–17.7)
MCH: 32.5 pg (ref 26.6–33.0)
MCHC: 34.9 g/dL (ref 31.5–35.7)
MCV: 93 fL (ref 79–97)
Platelets: 222 10*3/uL (ref 150–450)
RBC: 4.93 x10E6/uL (ref 4.14–5.80)
RDW: 12.9 % (ref 11.6–15.4)
WBC: 4.7 10*3/uL (ref 3.4–10.8)

## 2022-03-25 LAB — MICROALBUMIN / CREATININE URINE RATIO
Creatinine, Urine: 71.3 mg/dL
Microalb/Creat Ratio: 8 mg/g creat (ref 0–29)
Microalbumin, Urine: 5.7 ug/mL

## 2022-03-25 LAB — TSH: TSH: 2.2 u[IU]/mL (ref 0.450–4.500)

## 2022-03-25 LAB — HEMOGLOBIN A1C
Est. average glucose Bld gHb Est-mCnc: 174 mg/dL
Hgb A1c MFr Bld: 7.7 % — ABNORMAL HIGH (ref 4.8–5.6)

## 2022-03-28 DIAGNOSIS — N1831 Chronic kidney disease, stage 3a: Secondary | ICD-10-CM | POA: Diagnosis not present

## 2022-03-28 DIAGNOSIS — E1122 Type 2 diabetes mellitus with diabetic chronic kidney disease: Secondary | ICD-10-CM | POA: Diagnosis not present

## 2022-03-29 LAB — LIPID PANEL
Chol/HDL Ratio: 2.4 ratio (ref 0.0–5.0)
Cholesterol, Total: 123 mg/dL (ref 100–199)
HDL: 52 mg/dL (ref >39)
LDL Chol Calc (NIH): 56 mg/dL (ref 0–99)
Triglycerides: 71 mg/dL (ref 0–149)
VLDL Cholesterol Cal: 15 mg/dL (ref 5–40)

## 2022-05-10 ENCOUNTER — Telehealth: Payer: Self-pay

## 2022-05-10 NOTE — Chronic Care Management (AMB) (Signed)
Faxed updated income to [place] patient assistance program to see if patient will be eligible for assistance.  Pattricia Boss, [place] Pharmacist Assistant [phone]

## 2022-05-25 ENCOUNTER — Other Ambulatory Visit: Payer: Self-pay | Admitting: Internal Medicine

## 2022-06-21 ENCOUNTER — Telehealth: Payer: Self-pay

## 2022-06-21 NOTE — Chronic Care Management (AMB) (Addendum)
06-21-2022: Sent message to [place] CMA to confirm which Patient assistance medication needs to be followed up on with BI cares. Left patient a VM to clarify.  08-02-2023Sydnee Cabal stated follow up is needed for Ozempic. Contacted [employer] and was told date on application is over 6 months and patient will have to complete a updated application. Informed patient that application will be mailed out to him again. Sent tamala a task to reprint/ mail application.  [place] Pharmacist Assistant [phone]

## 2022-06-22 ENCOUNTER — Other Ambulatory Visit: Payer: Self-pay

## 2022-06-22 MED ORDER — OZEMPIC (0.25 OR 0.5 MG/DOSE) 2 MG/3ML ~~LOC~~ SOPN: 0.2500 mg | PEN_INJECTOR | SUBCUTANEOUS | 1 refills | Status: DC

## 2022-06-28 ENCOUNTER — Other Ambulatory Visit: Payer: Self-pay

## 2022-06-28 MED ORDER — OZEMPIC (0.25 OR 0.5 MG/DOSE) 2 MG/3ML ~~LOC~~ SOPN
0.2500 mg | PEN_INJECTOR | SUBCUTANEOUS | 1 refills | Status: AC
Start: 2022-06-28 — End: ?

## 2022-06-29 ENCOUNTER — Telehealth: Payer: Self-pay

## 2022-07-01 NOTE — Chronic Care Management (AMB) (Signed)
Re-enrollment application faxed to [employer] patient assistance program for Ozempic 0.5 mg. Patient does not have any [employer] C[employer] application, error in previous encounter.  Pattricia Boss, [place] Pharmacist Assistant [phone]

## 2022-07-19 ENCOUNTER — Telehealth: Payer: Self-pay

## 2022-07-19 NOTE — Telephone Encounter (Signed)
Patient Assistance Application  Current Barriers:  Financial Barriers: patient is unable to afford Ozempic   Pharmacist Clinical Goal(s):  Over the next 30 days, patient will work with PharmD and providers to relieve medication access concerns  Interventions: Comprehensive medication review completed; medication list updated in electronic medical record.  Patient made aware Ozempic was delivered through [place] patient assistance program.   Patient Self Care Activities:  Patient will provide necessary portions of application   Orlando Penner, CPP, PharmD Clinical Pharmacist Practitioner [facility] [phone]

## 2022-07-26 ENCOUNTER — Telehealth: Payer: Self-pay

## 2022-07-26 ENCOUNTER — Ambulatory Visit: Payer: Medicare PPO | Admitting: Internal Medicine

## 2022-07-26 NOTE — Chronic Care Management (AMB) (Signed)
[employer] patient assistance program notification:  Patient approved to receive Ozempic through [employer] patient assistance program and a 120- day supply should arrive to the office in 10-14 business days. Patient enrollment will expire on 10/20/2022.  Pattricia Boss, [place] Pharmacist Assistant [phone]

## 2022-08-02 ENCOUNTER — Ambulatory Visit (INDEPENDENT_AMBULATORY_CARE_PROVIDER_SITE_OTHER): Payer: Medicare PPO

## 2022-08-02 VITALS — BP 120/70 | HR 65 | Temp 98.4°F | Ht 66.0 in | Wt 159.0 lb

## 2022-08-02 DIAGNOSIS — Z23 Encounter for immunization: Secondary | ICD-10-CM

## 2022-08-02 NOTE — Progress Notes (Signed)
Patient presents today for a flu shot. YL,RMA 

## 2022-08-04 ENCOUNTER — Ambulatory Visit: Payer: Medicare PPO | Admitting: Internal Medicine

## 2022-08-04 ENCOUNTER — Encounter: Payer: Self-pay | Admitting: Internal Medicine

## 2022-08-04 VITALS — BP 112/80 | HR 68 | Temp 97.5°F | Ht 66.0 in | Wt 160.0 lb

## 2022-08-04 DIAGNOSIS — N1831 Chronic kidney disease, stage 3a: Secondary | ICD-10-CM | POA: Diagnosis not present

## 2022-08-04 DIAGNOSIS — E1122 Type 2 diabetes mellitus with diabetic chronic kidney disease: Secondary | ICD-10-CM

## 2022-08-04 DIAGNOSIS — R413 Other amnesia: Secondary | ICD-10-CM

## 2022-08-04 DIAGNOSIS — H6121 Impacted cerumen, right ear: Secondary | ICD-10-CM | POA: Diagnosis not present

## 2022-08-04 DIAGNOSIS — Z6825 Body mass index (BMI) 25.0-25.9, adult: Secondary | ICD-10-CM | POA: Diagnosis not present

## 2022-08-04 DIAGNOSIS — I1310 Hypertensive heart and chronic kidney disease without heart failure, with stage 1 through stage 4 chronic kidney disease, or unspecified chronic kidney disease: Secondary | ICD-10-CM

## 2022-08-04 DIAGNOSIS — H9193 Unspecified hearing loss, bilateral: Secondary | ICD-10-CM | POA: Diagnosis not present

## 2022-08-04 NOTE — Patient Instructions (Signed)

## 2022-08-04 NOTE — Progress Notes (Signed)
Jose Ponce,acting as a Education administrator for Jose Greenland, MD.,have documented all relevant documentation on the behalf of Jose Greenland, MD,as directed by  Jose Greenland, MD while in the presence of Jose Greenland, MD.    Subjective:     Patient ID: Jose Ponce , male    DOB: 21-Jan-1941 , 81 y.o.   MRN: [id number]   Chief Complaint  Patient presents with   Diabetes    HPI  Pt is here today for diabetes, and blood pressure check. He reports compliance with meds. He denies headaches, chest pain and shortness of breath.   Diabetes He presents for his follow-up diabetic visit. He has type 2 diabetes mellitus. His disease course has been stable. There are no hypoglycemic associated symptoms. Pertinent negatives for hypoglycemia include no dizziness or headaches. Pertinent negatives for diabetes include no blurred vision, no fatigue, no polydipsia, no polyphagia and no polyuria. There are no hypoglycemic complications. Diabetic complications include nephropathy. Risk factors for coronary artery disease include diabetes mellitus, dyslipidemia, hypertension and male sex. He is following a diabetic diet. He participates in exercise three times a week. An ACE inhibitor/angiotensin II receptor blocker is being taken.  Hypertension This is a chronic problem. The current episode started more than 1 year ago. The problem has been gradually improving since onset. Pertinent negatives include no blurred vision or headaches. The current treatment provides moderate improvement. Compliance problems include exercise.  Hypertensive end-organ damage includes kidney disease and CAD/MI.     Past Medical History:  Diagnosis Date   Chronic kidney disease    Coronary artery disease    Gout    Hyperlipidemia    Hypertension    Type 2 diabetes mellitus ([place])      Family History  Problem Relation Age of Onset   Asthma Mother    Heart disease Father    Stroke Father    Heart attack Father 72    Cancer Sister 10   Diabetes Brother    Heart disease Maternal Grandmother      Current Outpatient Medications:    allopurinol (ZYLOPRIM) 100 MG tablet, TAKE 1/2 TABLET BY MOUTH EVERY DAY, Disp: 45 tablet, Rfl: 1   aspirin 81 MG tablet, Take 81 mg by mouth daily., Disp: , Rfl:    Cholecalciferol 125 MCG (5000 UT) CHEW, Chew 5,000 Units by mouth. , Disp: , Rfl:    dapagliflozin propanediol (FARXIGA) 5 MG TABS tablet, Take by mouth daily., Disp: , Rfl:    Diclofenac Sodium 3 % GEL, Place onto the skin., Disp: , Rfl:    Flaxseed, Linseed, (FLAX SEEDS PO), Take 1 tablet by mouth daily., Disp: , Rfl:    ketoconazole (NIZORAL) 2 % cream, Apply 1 application topically daily., Disp: , Rfl:    lidocaine (LIDODERM) 5 %, Place 1 patch onto the skin daily. Remove & Discard patch within 12 hours or as directed by MD Place over left lower back., Disp: 30 patch, Rfl: 0   linaclotide (LINZESS) 145 MCG CAPS capsule, Take 145 mcg by mouth daily before breakfast., Disp: , Rfl:    metoprolol succinate (TOPROL-XL) 50 MG 24 hr tablet, TAKE 1 TABLET BY MOUTH EVERY DAY, Disp: 90 tablet, Rfl: 1   rosuvastatin (CRESTOR) 10 MG tablet, TAKE 1 TABLET BY MOUTH EVERY DAY, Disp: 90 tablet, Rfl: 1   Semaglutide,0.25 or 0.5MG /DOS, (OZEMPIC, 0.25 OR 0.5 MG/DOSE,) 2 MG/3ML SOPN, Inject 0.25 mg into the skin once a week., Disp: 6 mL, Rfl: 1  sildenafil (VIAGRA) 50 MG tablet, Take 1 tablet (50 mg total) by mouth daily as needed for erectile dysfunction., Disp: 30 tablet, Rfl: 0   telmisartan (MICARDIS) 20 MG tablet, TAKE 1 TABLET BY MOUTH EVERY DAY, Disp: 90 tablet, Rfl: 1   triamcinolone cream (KENALOG) 0.1 %, APPLY TO AFFECTED AREA on hand TWICE DAILY AS NEEDED, Disp: 45 g, Rfl: 0  Current Facility-Administered Medications:    cyanocobalamin ((VITAMIN B-12)) injection 1,000 mcg, 1,000 mcg, Intramuscular, Once, [place] Chard, MD   No Known Allergies   Review of Systems  Constitutional: Negative.  Negative for fatigue.   HENT:  Positive for hearing loss.   Eyes: Negative.  Negative for blurred vision.  Respiratory: Negative.    Cardiovascular: Negative.   Gastrointestinal: Negative.   Endocrine: Negative for polydipsia, polyphagia and polyuria.  Musculoskeletal: Negative.   Skin: Negative.   Neurological: Negative.  Negative for dizziness and headaches.        He c/o memory issues. Has noticed issues with names. It has not affected ADLs.  Psychiatric/Behavioral: Negative.       Today's Vitals   08/04/22 1401  BP: 112/80  Pulse: 68  Temp: (!) 97.5 F (36.4 C)  Weight: 160 lb (72.6 kg)  Height: $Remove'5\' 6"'relEZrE$  (1.676 m)  PainSc: 0-No pain   Body mass index is 25.82 kg/m.  Wt Readings from Last 3 Encounters:  08/04/22 160 lb (72.6 kg)  08/02/22 159 lb (72.1 kg)  03/24/22 163 lb (73.9 kg)     Objective:  Physical Exam Vitals and nursing note reviewed.  Constitutional:      Appearance: Normal appearance.  HENT:     Head: Normocephalic and atraumatic.     Right Ear: Ear canal and external ear normal. There is impacted cerumen.     Left Ear: Tympanic membrane, ear canal and external ear normal. There is no impacted cerumen.  Eyes:     Extraocular Movements: Extraocular movements intact.  Cardiovascular:     Rate and Rhythm: Normal rate and regular rhythm.     Heart sounds: Normal heart sounds.  Pulmonary:     Effort: Pulmonary effort is normal.     Breath sounds: Normal breath sounds.  Musculoskeletal:     Cervical back: Normal range of motion.  Skin:    General: Skin is warm.  Neurological:     General: No focal deficit present.     Mental Status: He is alert.  Psychiatric:        Mood and Affect: Mood normal.       Assessment And Plan:     1. Type 2 diabetes mellitus with stage 3a chronic kidney disease, without long-term current use of insulin (HCC) Comments: Chronic, I will check labs as below. I will adjust meds as needed. Importance of dietary/medication compliance was d/w  patient.  - CMP14+EGFR - Hemoglobin A1c - Protein electrophoresis, serum - Phosphorus - Parathyroid Hormone, Intact w/Ca  2. Benign hypertensive heart and renal disease Comments: Chronic, well controlled. He is encouraged to follow low sodium diet, no change in anti- hypertensive regimen at this time.  - CMP14+EGFR  3. Memory changes Comments: I will check labs as below. Possibly related to hearing deficit.  - Vitamin B12  4. Bilateral hearing loss, unspecified hearing loss type Comments: He agrees to audiology evaluation.  - Ambulatory referral to Audiology  5. Impacted cerumen of right ear Comments: After obtaining verbal consent, right ear was flushed via irrigation. No TM abnormalities were noted.  -  Ear Lavage  6. BMI 25.0-25.9,adult Comments: He is encouraged to aim for at least 150 minutes of exercise per week.    Patient was given opportunity to ask questions. Patient verbalized understanding of the plan and was able to repeat key elements of the plan. All questions were answered to their satisfaction.   I, Jose Greenland, MD, have reviewed all documentation for this visit. The documentation on 08/04/22 for the exam, diagnosis, procedures, and orders are all accurate and complete.   IF YOU HAVE BEEN REFERRED TO A SPECIALIST, IT MAY TAKE 1-2 WEEKS TO SCHEDULE/PROCESS THE REFERRAL. IF YOU HAVE NOT HEARD FROM US/SPECIALIST IN TWO WEEKS, PLEASE GIVE [place] A CALL AT [phone] X [phone].   THE PATIENT IS ENCOURAGED TO PRACTICE SOCIAL DISTANCING DUE TO THE COVID-19 PANDEMIC.

## 2022-08-08 LAB — PROTEIN ELECTROPHORESIS, SERUM
A/G Ratio: 1.1 (ref 0.7–1.7)
Albumin ELP: 3.9 g/dL (ref 2.9–4.4)
Alpha 1: 0.2 g/dL (ref 0.0–0.4)
Alpha 2: 0.7 g/dL (ref 0.4–1.0)
Beta: 1.1 g/dL (ref 0.7–1.3)
Gamma Globulin: 1.4 g/dL (ref 0.4–1.8)
Globulin, Total: 3.5 g/dL (ref 2.2–3.9)

## 2022-08-08 LAB — CMP14+EGFR
ALT: 16 IU/L (ref 0–44)
AST: 16 IU/L (ref 0–40)
Albumin/Globulin Ratio: 1.4 (ref 1.2–2.2)
Albumin: 4.3 g/dL (ref 3.8–4.8)
Alkaline Phosphatase: 73 IU/L (ref 44–121)
BUN/Creatinine Ratio: 13 (ref 10–24)
BUN: 20 mg/dL (ref 8–27)
Bilirubin Total: 0.3 mg/dL (ref 0.0–1.2)
CO2: 24 mmol/L (ref 20–29)
Calcium: 9.7 mg/dL (ref 8.6–10.2)
Chloride: 106 mmol/L (ref 96–106)
Creatinine, Ser: 1.57 mg/dL — ABNORMAL HIGH (ref 0.76–1.27)
Globulin, Total: 3.1 g/dL (ref 1.5–4.5)
Glucose: 133 mg/dL — ABNORMAL HIGH (ref 70–99)
Potassium: 4.9 mmol/L (ref 3.5–5.2)
Sodium: 144 mmol/L (ref 134–144)
Total Protein: 7.4 g/dL (ref 6.0–8.5)
eGFR: 44 mL/min/{1.73_m2} — ABNORMAL LOW (ref >59)

## 2022-08-08 LAB — HEMOGLOBIN A1C
Est. average glucose Bld gHb Est-mCnc: 169 mg/dL
Hgb A1c MFr Bld: 7.5 % — ABNORMAL HIGH (ref 4.8–5.6)

## 2022-08-08 LAB — PTH, INTACT AND CALCIUM: PTH: 45 pg/mL (ref 15–65)

## 2022-08-08 LAB — PHOSPHORUS: Phosphorus: 3.6 mg/dL (ref 2.8–4.1)

## 2022-08-08 LAB — VITAMIN B12: Vitamin B-12: 423 pg/mL (ref 232–1245)

## 2022-08-11 ENCOUNTER — Ambulatory Visit: Payer: Medicare PPO | Admitting: Audiologist

## 2022-08-19 ENCOUNTER — Other Ambulatory Visit: Payer: Self-pay | Admitting: Internal Medicine

## 2022-10-25 ENCOUNTER — Telehealth: Payer: Self-pay

## 2022-10-25 NOTE — Chronic Care Management (AMB) (Signed)
[employer] patient assistance program notification:  120- day supply of Ozempic was filled on 10/20/2022 and should arrive to the office in 10-14 business days. Patient has 0  refill remaining and enrollment will expire on 11/20/2022.  Awaiting patient re-enrollment application.   Pattricia Boss, [place] Pharmacist Assistant [phone]

## 2022-11-07 ENCOUNTER — Other Ambulatory Visit: Payer: Self-pay | Admitting: Internal Medicine

## 2022-11-10 ENCOUNTER — Ambulatory Visit (INDEPENDENT_AMBULATORY_CARE_PROVIDER_SITE_OTHER): Payer: Medicare PPO | Admitting: Internal Medicine

## 2022-11-10 ENCOUNTER — Encounter: Payer: Self-pay | Admitting: Internal Medicine

## 2022-11-10 ENCOUNTER — Telehealth: Payer: Self-pay

## 2022-11-10 VITALS — BP 120/80 | HR 61 | Ht 66.0 in | Wt 163.0 lb

## 2022-11-10 DIAGNOSIS — N1831 Chronic kidney disease, stage 3a: Secondary | ICD-10-CM

## 2022-11-10 DIAGNOSIS — M79671 Pain in right foot: Secondary | ICD-10-CM

## 2022-11-10 DIAGNOSIS — E1122 Type 2 diabetes mellitus with diabetic chronic kidney disease: Secondary | ICD-10-CM | POA: Diagnosis not present

## 2022-11-10 DIAGNOSIS — Z6826 Body mass index (BMI) 26.0-26.9, adult: Secondary | ICD-10-CM | POA: Diagnosis not present

## 2022-11-10 DIAGNOSIS — I1310 Hypertensive heart and chronic kidney disease without heart failure, with stage 1 through stage 4 chronic kidney disease, or unspecified chronic kidney disease: Secondary | ICD-10-CM

## 2022-11-10 NOTE — Progress Notes (Signed)
Jose Ponce,acting as a Education administrator for Jose Greenland, MD.,have documented all relevant documentation on the behalf of Jose Greenland, MD,as directed by  Jose Greenland, MD while in the presence of Jose Greenland, MD.    Subjective:     Patient ID: Jose Ponce , male    DOB: [birth year] , 81 y.o.   MRN: [id number]   Chief Complaint  Patient presents with   Gout    HPI  Patient presents today for a gout flare. He walked into the office today w/ c/o foot pain. Denies fall/trauma. Thinks he may be having a gout flare. While here, he agreed to proceed with DM/HTN check. He reports compliance with meds. He denies having any headaches, chest pain and shortness of breath.   Diabetes He presents for his follow-up diabetic visit. He has type 2 diabetes mellitus. His disease course has been stable. There are no hypoglycemic associated symptoms. Pertinent negatives for hypoglycemia include no dizziness or headaches. Pertinent negatives for diabetes include no blurred vision, no fatigue, no polydipsia, no polyphagia and no polyuria. There are no hypoglycemic complications. Diabetic complications include nephropathy. Risk factors for coronary artery disease include diabetes mellitus, dyslipidemia, hypertension and male sex. He is following a diabetic diet. He participates in exercise three times a week. An ACE inhibitor/angiotensin II receptor blocker is being taken.  Hypertension This is a chronic problem. The current episode started more than 1 year ago. The problem has been gradually improving since onset. Pertinent negatives include no blurred vision or headaches. The current treatment provides moderate improvement. Compliance problems include exercise.  Hypertensive end-organ damage includes kidney disease and CAD/MI.     Past Medical History:  Diagnosis Date   Chronic kidney disease    Coronary artery disease    Gout    Hyperlipidemia    Hypertension    Type 2 diabetes mellitus  ([place])      Family History  Problem Relation Age of Onset   Asthma Mother    Heart disease Father    Stroke Father    Heart attack Father 88   Cancer Sister 84   Diabetes Brother    Heart disease Maternal Grandmother      Current Outpatient Medications:    allopurinol (ZYLOPRIM) 100 MG tablet, TAKE 1/2 TABLET BY MOUTH EVERY DAY, Disp: 45 tablet, Rfl: 1   aspirin 81 MG tablet, Take 81 mg by mouth daily., Disp: , Rfl:    Cholecalciferol 125 MCG (5000 UT) CHEW, Chew 5,000 Units by mouth. , Disp: , Rfl:    dapagliflozin propanediol (FARXIGA) 5 MG TABS tablet, Take by mouth daily., Disp: , Rfl:    Diclofenac Sodium 3 % GEL, Place onto the skin., Disp: , Rfl:    Flaxseed, Linseed, (FLAX SEEDS PO), Take 1 tablet by mouth daily., Disp: , Rfl:    ketoconazole (NIZORAL) 2 % cream, Apply 1 application topically daily., Disp: , Rfl:    lidocaine (LIDODERM) 5 %, Place 1 patch onto the skin daily. Remove & Discard patch within 12 hours or as directed by MD Place over left lower back., Disp: 30 patch, Rfl: 0   linaclotide (LINZESS) 145 MCG CAPS capsule, Take 145 mcg by mouth daily before breakfast., Disp: , Rfl:    metoprolol succinate (TOPROL-XL) 50 MG 24 hr tablet, TAKE 1 TABLET BY MOUTH EVERY DAY, Disp: 90 tablet, Rfl: 1   rosuvastatin (CRESTOR) 10 MG tablet, TAKE 1 TABLET BY MOUTH EVERY DAY, Disp: 90 tablet, Rfl:  1   Semaglutide,0.25 or 0.5MG/DOS, (OZEMPIC, 0.25 OR 0.5 MG/DOSE,) 2 MG/3ML SOPN, Inject 0.25 mg into the skin once a week., Disp: 6 mL, Rfl: 1   sildenafil (VIAGRA) 50 MG tablet, Take 1 tablet (50 mg total) by mouth daily as needed for erectile dysfunction., Disp: 30 tablet, Rfl: 0   telmisartan (MICARDIS) 20 MG tablet, TAKE 1 TABLET BY MOUTH EVERY DAY, Disp: 90 tablet, Rfl: 1   triamcinolone cream (KENALOG) 0.1 %, APPLY TO AFFECTED AREA on hand TWICE DAILY AS NEEDED, Disp: 45 g, Rfl: 0  Current Facility-Administered Medications:    cyanocobalamin ((VITAMIN B-12)) injection 1,000 mcg,  1,000 mcg, Intramuscular, Once, [place]endale Chard, MD   No Known Allergies   Review of Systems  Constitutional: Negative.  Negative for fatigue.  Eyes:  Negative for blurred vision.  Respiratory: Negative.    Cardiovascular: Negative.   Gastrointestinal: Negative.   Endocrine: Negative for polydipsia, polyphagia and polyuria.  Musculoskeletal:  Positive for arthralgias.  Neurological: Negative.  Negative for dizziness and headaches.  Psychiatric/Behavioral: Negative.       Today's Vitals   11/10/22 1448  BP: 120/80  Pulse: 61  Weight: 163 lb (73.9 kg)  Height: 5' 6" (1.676 m)   Body mass index is 26.31 kg/m.  Wt Readings from Last 3 Encounters:  11/10/22 163 lb (73.9 kg)  08/04/22 160 lb (72.6 kg)  08/02/22 159 lb (72.1 kg)     Objective:  Physical Exam Vitals and nursing note reviewed.  Constitutional:      Appearance: Normal appearance.  HENT:     Head: Normocephalic and atraumatic.     Nose:     Comments: Masked     Mouth/Throat:     Comments: Masked  Eyes:     Extraocular Movements: Extraocular movements intact.  Cardiovascular:     Rate and Rhythm: Normal rate and regular rhythm.     Heart sounds: Normal heart sounds.  Pulmonary:     Effort: Pulmonary effort is normal.     Breath sounds: Normal breath sounds.  Musculoskeletal:     Cervical back: Normal range of motion.       Feet:  Feet:     Right foot:     Skin integrity: Dry skin present. No ulcer, erythema or warmth.  Skin:    General: Skin is warm.  Neurological:     General: No focal deficit present.     Mental Status: He is alert.  Psychiatric:        Mood and Affect: Mood normal.       Assessment And Plan:     1. Right foot pain Comments: There is no warmth, redness or swelling present. Sx not suggestive of gout, perhaps sx due to arthritis. Advised to apply Voltaren gel to affected area BID prn. - Uric acid  2. Type 2 diabetes mellitus with stage 3a chronic kidney disease, without  long-term current use of insulin (HCC) Comments: Chronic, I will check labs as below. I will adjust meds as needed. He is on GLP-1 agonist and SGLT2 inhibitor therapy. - BMP8+EGFR - Hemoglobin A1c  3. Benign hypertensive heart and renal disease Comments: Chronic, well controlled. He will c/w Toprol XL 45m daily and telmisartan 265mdaily. Advised to follow a low sodium diet.  4. BMI 26.0-26.9,adult Comments: He is encouraged to aim for at least 150 minutes of exercise per week.     Patient was given opportunity to ask questions. Patient verbalized understanding of the plan and was  able to repeat key elements of the plan. All questions were answered to their satisfaction.   I, Jose Greenland, MD, have reviewed all documentation for this visit. The documentation on 11/17/22 for the exam, diagnosis, procedures, and orders are all accurate and complete.   IF YOU HAVE BEEN REFERRED TO A SPECIALIST, IT MAY TAKE 1-2 WEEKS TO SCHEDULE/PROCESS THE REFERRAL. IF YOU HAVE NOT HEARD FROM US/SPECIALIST IN TWO WEEKS, PLEASE GIVE [place] A CALL AT [phone].   THE PATIENT IS ENCOURAGED TO PRACTICE SOCIAL DISTANCING DUE TO THE COVID-19 PANDEMIC.

## 2022-11-10 NOTE — Patient Instructions (Signed)
Gout  Gout is painful swelling of your joints. Gout is a type of arthritis. It is caused by having too much uric acid in your body. Uric acid is a chemical that is made when your body breaks down substances called purines. If your body has too much uric acid, sharp crystals can form and build up in your joints. This causes pain and swelling. Gout attacks can happen quickly and be very painful (acute gout). Over time, the attacks can affect more joints and happen more often (chronic gout). What are the causes? Gout is caused by too much uric acid in your blood. This can happen because: Your kidneys do not remove enough uric acid from your blood. Your body makes too much uric acid. You eat too many foods that are high in purines. These foods include organ meats, some seafood, and beer. Trauma or stress can bring on an attack. What increases the risk? Having a family history of gout. Being male and middle-aged. Being male and having gone through menopause. Having an organ transplant. Taking certain medicines. Having certain conditions, such as: Being very overweight (obese). Lead poisoning. Kidney disease. A skin condition called psoriasis. Other risks include: Losing weight too quickly. Not having enough water in the body (being dehydrated). Drinking alcohol, especially beer. Drinking beverages that are sweetened with a type of sugar called fructose. What are the signs or symptoms? An attack of acute gout often starts at night and usually happens in just one joint. The most common place is the big toe. Other joints that may be affected include joints of the feet, ankle, knee, fingers, wrist, or elbow. Symptoms may include: Very bad pain. Warmth. Swelling. Stiffness. Tenderness. The affected joint may be very painful to touch. Shiny, red, or purple skin. Chills and fever. Chronic gout may cause symptoms more often. More joints may be involved. You may also have white or yellow lumps  (tophi) on your hands or feet or in other areas near your joints. How is this treated? Treatment for an acute attack may include medicines for pain and swelling, such as: NSAIDs, such as ibuprofen. Steroids taken by mouth or injected into a joint. Colchicine. This can be given by mouth or through an IV tube. Treatment to prevent future attacks may include: Taking small doses of NSAIDs or colchicine daily. Using a medicine that reduces uric acid levels in your blood, such as allopurinol. Making changes to your diet. You may need to see a food expert (dietitian) about what to eat and drink to prevent gout. Follow these instructions at home: During a gout attack  If told, put ice on the painful area. To do this: Put ice in a plastic bag. Place a towel between your skin and the bag. Leave the ice on for 20 minutes, 2-3 times a day. Take off the ice if your skin turns bright red. This is very important. If you cannot feel pain, heat, or cold, you have a greater risk of damage to the area. Raise the painful joint above the level of your heart as often as you can. Rest the joint as much as possible. If the joint is in your leg, you may be given crutches. Follow instructions from your doctor about what you cannot eat or drink. Avoiding future gout attacks Eat a low-purine diet. Avoid foods and drinks such as: Liver. Kidney. Anchovies. Asparagus. Herring. Mushrooms. Mussels. Beer. Stay at a healthy weight. If you want to lose weight, talk with your doctor. Do not   lose weight too fast. Start or continue an exercise plan as told by your doctor. Eating and drinking Avoid drinks sweetened by fructose. Drink enough fluids to keep your pee (urine) pale yellow. If you drink alcohol: Limit how much you have to: 0-1 drink a day for women who are not pregnant. 0-2 drinks a day for men. Know how much alcohol is in a drink. In the [place] one drink equals one 12 oz bottle of beer (355 mL), one 5 oz  glass of wine (148 mL), or one 1 oz glass of hard liquor (44 mL). General instructions Take over-the-counter and prescription medicines only as told by your doctor. Ask your doctor if you should avoid driving or using machines while you are taking your medicine. Return to your normal activities when your doctor says that it is safe. Keep all follow-up visits. Where to find more information National Institutes of Health: www.niams.nih.gov Contact a doctor if: You have another gout attack. You still have symptoms of a gout attack after 10 days of treatment. You have problems (side effects) because of your medicines. You have chills or a fever. You have burning pain when you pee (urinate). You have pain in your lower back or belly. Get help right away if: You have very bad pain. Your pain cannot be controlled. You cannot pee. Summary Gout is painful swelling of the joints. The most common site of pain is the big toe, but it can affect other joints. Medicines and avoiding some foods can help to prevent and treat gout attacks. This information is not intended to replace advice given to you by your health care provider. Make sure you discuss any questions you have with your health care provider. Document Revised: 08/11/2021 Document Reviewed: 08/11/2021 Elsevier Patient Education  2023 Elsevier Inc.  

## 2022-11-10 NOTE — Chronic Care Management (AMB) (Signed)
Patient aware Ozempic patient assistance medication has arrived to the office and ready for pick up.    Pattricia Boss, [place] Pharmacist Assistant [phone]

## 2022-11-11 LAB — HEMOGLOBIN A1C
Est. average glucose Bld gHb Est-mCnc: 177 mg/dL
Hgb A1c MFr Bld: 7.8 % — ABNORMAL HIGH (ref 4.8–5.6)

## 2022-11-11 LAB — BMP8+EGFR
BUN/Creatinine Ratio: 13 (ref 10–24)
BUN: 22 mg/dL (ref 8–27)
CO2: 25 mmol/L (ref 20–29)
Calcium: 9.5 mg/dL (ref 8.6–10.2)
Chloride: 105 mmol/L (ref 96–106)
Creatinine, Ser: 1.63 mg/dL — ABNORMAL HIGH (ref 0.76–1.27)
Glucose: 158 mg/dL — ABNORMAL HIGH (ref 70–99)
Potassium: 4.8 mmol/L (ref 3.5–5.2)
Sodium: 142 mmol/L (ref 134–144)
eGFR: 42 mL/min/{1.73_m2} — ABNORMAL LOW (ref >59)

## 2022-11-11 LAB — URIC ACID: Uric Acid: 5.9 mg/dL (ref 3.8–8.4)

## 2022-11-23 ENCOUNTER — Other Ambulatory Visit: Payer: Self-pay

## 2022-11-23 ENCOUNTER — Emergency Department (HOSPITAL_COMMUNITY): Payer: Medicare PPO

## 2022-11-23 ENCOUNTER — Emergency Department (HOSPITAL_COMMUNITY)
Admission: EM | Admit: 2022-11-23 | Discharge: 2022-11-24 | Disposition: A | Discharge status: Home / Self Care | Payer: Medicare PPO | Attending: Emergency Medicine | Admitting: Emergency Medicine

## 2022-11-23 DIAGNOSIS — N189 Chronic kidney disease, unspecified: Secondary | ICD-10-CM | POA: Insufficient documentation

## 2022-11-23 DIAGNOSIS — I129 Hypertensive chronic kidney disease with stage 1 through stage 4 chronic kidney disease, or unspecified chronic kidney disease: Secondary | ICD-10-CM | POA: Diagnosis not present

## 2022-11-23 DIAGNOSIS — I444 Left anterior fascicular block: Secondary | ICD-10-CM | POA: Diagnosis not present

## 2022-11-23 DIAGNOSIS — E119 Type 2 diabetes mellitus without complications: Secondary | ICD-10-CM | POA: Insufficient documentation

## 2022-11-23 DIAGNOSIS — R079 Chest pain, unspecified: Secondary | ICD-10-CM | POA: Diagnosis not present

## 2022-11-23 DIAGNOSIS — I2510 Atherosclerotic heart disease of native coronary artery without angina pectoris: Secondary | ICD-10-CM | POA: Diagnosis not present

## 2022-11-23 DIAGNOSIS — Z955 Presence of coronary angioplasty implant and graft: Secondary | ICD-10-CM | POA: Diagnosis not present

## 2022-11-23 DIAGNOSIS — Z7982 Long term (current) use of aspirin: Secondary | ICD-10-CM | POA: Insufficient documentation

## 2022-11-23 DIAGNOSIS — N1831 Chronic kidney disease, stage 3a: Secondary | ICD-10-CM | POA: Diagnosis not present

## 2022-11-23 DIAGNOSIS — I4510 Unspecified right bundle-branch block: Secondary | ICD-10-CM | POA: Diagnosis not present

## 2022-11-23 DIAGNOSIS — Z794 Long term (current) use of insulin: Secondary | ICD-10-CM | POA: Diagnosis not present

## 2022-11-23 DIAGNOSIS — Z7984 Long term (current) use of oral hypoglycemic drugs: Secondary | ICD-10-CM | POA: Diagnosis not present

## 2022-11-23 DIAGNOSIS — Z951 Presence of aortocoronary bypass graft: Secondary | ICD-10-CM | POA: Diagnosis not present

## 2022-11-23 DIAGNOSIS — Z79899 Other long term (current) drug therapy: Secondary | ICD-10-CM | POA: Insufficient documentation

## 2022-11-23 DIAGNOSIS — R0789 Other chest pain: Secondary | ICD-10-CM | POA: Diagnosis not present

## 2022-11-23 DIAGNOSIS — M109 Gout, unspecified: Secondary | ICD-10-CM | POA: Diagnosis not present

## 2022-11-23 HISTORY — DX: Malignant neoplasm of prostate: C61

## 2022-11-23 LAB — BASIC METABOLIC PANEL
Anion gap: 7 (ref 5–15)
BUN: 17 mg/dL (ref 8–23)
CO2: 25 mmol/L (ref 22–32)
Calcium: 9 mg/dL (ref 8.9–10.3)
Chloride: 108 mmol/L (ref 98–111)
Creatinine, Ser: 1.45 mg/dL — ABNORMAL HIGH (ref 0.61–1.24)
GFR, Estimated: 49 mL/min — ABNORMAL LOW (ref >60)
Glucose, Bld: 129 mg/dL — ABNORMAL HIGH (ref 70–99)
Potassium: 4.2 mmol/L (ref 3.5–5.1)
Sodium: 140 mmol/L (ref 135–145)

## 2022-11-23 LAB — CBC
HCT: 44.1 % (ref 39.0–52.0)
Hemoglobin: 15 g/dL (ref 13.0–17.0)
MCH: 32.3 pg (ref 26.0–34.0)
MCHC: 34 g/dL (ref 30.0–36.0)
MCV: 94.8 fL (ref 80.0–100.0)
Platelets: 195 10*3/uL (ref 150–400)
RBC: 4.65 MIL/uL (ref 4.22–5.81)
RDW: 12.9 % (ref 11.5–15.5)
WBC: 5.2 10*3/uL (ref 4.0–10.5)
nRBC: 0 % (ref 0.0–0.2)

## 2022-11-23 LAB — TROPONIN I (HIGH SENSITIVITY)
Troponin I (High Sensitivity): 8 ng/L (ref <18)
Troponin I (High Sensitivity): 9 ng/L (ref <18)

## 2022-11-23 NOTE — ED Provider Triage Note (Signed)
Emergency Medicine Provider Triage Evaluation Note  Jose Ponce , a 82 y.o. male  was evaluated in triage.  Pt complains of chest pain.  Patient reports chest pain is been ongoing for the last "maybe a week", patient unable to tell me exact date.  Patient reports that chest pain does not present every day, comes and goes.  Patient states that chest pain is typically worsening down.  Patient is complaining of upper back pain that is worse with movement.  Patient reports that he has history of triple bypass with Dr. Caffie Pinto, currently under care of Dr. Oval Linsey of cardiology.  Patient denies any shortness of breath, nausea vomiting, abdominal pain, fevers, syncope, lightheadedness.  Patient denies leg swelling.  Review of Systems  Positive:  Negative:   Physical Exam  BP 139/63 (BP Location: Left Arm)   Pulse (!) 58   Temp 97.8 F (36.6 C) (Oral)   Resp 18   Ht '5\' 10"'$  (1.778 m)   Wt 73.5 kg   SpO2 99%   BMI 23.24 kg/m  Gen:   Awake, no distress   Resp:  Normal effort  MSK:   Moves extremities without difficulty  Other:  Lungs are clear  Medical Decision Making  Medically screening exam initiated at 5:52 PM.  Appropriate orders placed.  [name] was informed that the remainder of the evaluation will be completed by another provider, this initial triage assessment does not replace that evaluation, and the importance of remaining in the ED until their evaluation is complete.     Azucena Cecil, PA-C 11/23/22 1754

## 2022-11-23 NOTE — ED Triage Notes (Signed)
Pt reports chest discomfort x 2 days, states it felt like indigestion.

## 2022-11-24 ENCOUNTER — Encounter (HOSPITAL_COMMUNITY): Payer: Self-pay | Admitting: Family Medicine

## 2022-11-24 ENCOUNTER — Telehealth: Payer: Self-pay | Admitting: Cardiovascular Disease

## 2022-11-24 DIAGNOSIS — I2510 Atherosclerotic heart disease of native coronary artery without angina pectoris: Secondary | ICD-10-CM | POA: Diagnosis not present

## 2022-11-24 DIAGNOSIS — Z951 Presence of aortocoronary bypass graft: Secondary | ICD-10-CM

## 2022-11-24 DIAGNOSIS — I4510 Unspecified right bundle-branch block: Secondary | ICD-10-CM

## 2022-11-24 DIAGNOSIS — N1831 Chronic kidney disease, stage 3a: Secondary | ICD-10-CM

## 2022-11-24 DIAGNOSIS — E119 Type 2 diabetes mellitus without complications: Secondary | ICD-10-CM | POA: Diagnosis not present

## 2022-11-24 DIAGNOSIS — M109 Gout, unspecified: Secondary | ICD-10-CM | POA: Diagnosis not present

## 2022-11-24 DIAGNOSIS — I444 Left anterior fascicular block: Secondary | ICD-10-CM

## 2022-11-24 DIAGNOSIS — R079 Chest pain, unspecified: Secondary | ICD-10-CM | POA: Diagnosis not present

## 2022-11-24 LAB — HEPATIC FUNCTION PANEL
ALT: 18 U/L (ref 0–44)
AST: 16 U/L (ref 15–41)
Albumin: 4 g/dL (ref 3.5–5.0)
Alkaline Phosphatase: 52 U/L (ref 38–126)
Bilirubin, Direct: 0.1 mg/dL (ref 0.0–0.2)
Indirect Bilirubin: 0.7 mg/dL (ref 0.3–0.9)
Total Bilirubin: 0.8 mg/dL (ref 0.3–1.2)
Total Protein: 7 g/dL (ref 6.5–8.1)

## 2022-11-24 LAB — TROPONIN I (HIGH SENSITIVITY): Troponin I (High Sensitivity): 7 ng/L (ref <18)

## 2022-11-24 LAB — LIPASE, BLOOD: Lipase: 49 U/L (ref 11–51)

## 2022-11-24 MED ORDER — CALCIUM CARBONATE ANTACID 500 MG PO CHEW: 1.0000 | CHEWABLE_TABLET | Freq: Three times a day (TID) | ORAL | 3 refills | Status: DC | PRN

## 2022-11-24 MED ORDER — ONDANSETRON 4 MG PO TBDP
4.0000 mg | ORAL_TABLET | Freq: Once | ORAL | Status: AC
  Administered 2022-11-24: 4 mg via ORAL
  Filled 2022-11-24: qty 1

## 2022-11-24 MED ORDER — ONDANSETRON 4 MG PO TBDP: 4.0000 mg | ORAL_TABLET | Freq: Three times a day (TID) | ORAL | 0 refills | Status: DC | PRN

## 2022-11-24 MED ORDER — MAALOX MAX 400-400-40 MG/5ML PO SUSP: 5.0000 mL | Freq: Four times a day (QID) | ORAL | 0 refills | Status: DC | PRN

## 2022-11-24 MED ORDER — ALUM & MAG HYDROXIDE-SIMETH 200-200-20 MG/5ML PO SUSP
30.0000 mL | Freq: Once | ORAL | Status: AC
  Administered 2022-11-24: 30 mL via ORAL
  Filled 2022-11-24: qty 30

## 2022-11-24 NOTE — ED Provider Notes (Signed)
[facility] DEPT Provider Note   CSN: [id number] Arrival date & time: 11/23/22  1638     History  Chief Complaint  Patient presents with   Chest Pain    Jose Ponce is a 82 y.o. male.  With PMH of CAD status post CABG, DM2, HTN, HLD, CKD who presents with intermittent chest pain and discomfort associated with belching.  He does note that has been intermittent over the past week.  He is unsure of true inciting factors but does not feel like it is worse with exertion or ambulation.  He does note worse with laying flat and also had started after eating a large meal.  Today he had started belching but on my exam he denies any active pain.  There is no radiation of pain to the arms, neck or back.  However he does note intermittent upper back pain worse with ambulation.  He has had no focal weakness, numbness or tingling, loss of sensation.  He last saw his cardiologist Dr. Oval Linsey about a year ago.  His last Lexiscan was over 10 years ago.  He denies any shortness of breath associated with the symptoms, no vomiting, no diaphoresis, no leg pain or swelling.  He did not take any nitroglycerin for the symptoms.  He is compliant with his home medications.   Chest Pain      Home Medications Prior to Admission medications   Medication Sig Start Date End Date Taking? Authorizing Provider  alum & mag hydroxide-simeth (MAALOX MAX) 400-400-40 MG/5ML suspension Take 5 mLs by mouth every 6 (six) hours as needed for indigestion. 11/24/22  Yes Elgie Congo, MD  ondansetron (ZOFRAN-ODT) 4 MG disintegrating tablet Take 1 tablet (4 mg total) by mouth every 8 (eight) hours as needed for nausea or vomiting. 11/24/22  Yes Elgie Congo, MD  allopurinol (ZYLOPRIM) 100 MG tablet TAKE 1/2 TABLET BY MOUTH EVERY DAY 11/07/22   Glendale Chard, MD  aspirin 81 MG tablet Take 81 mg by mouth daily.    [provider]  Cholecalciferol 125 MCG (5000 UT) CHEW Chew 5,000  Units by mouth.     [provider]  dapagliflozin propanediol (FARXIGA) 5 MG TABS tablet Take by mouth daily.    [provider]  Diclofenac Sodium 3 % GEL Place onto the skin.    [provider]  Flaxseed, Linseed, (FLAX SEEDS PO) Take 1 tablet by mouth daily.    [provider]  ketoconazole (NIZORAL) 2 % cream Apply 1 application topically daily.    [provider]  lidocaine (LIDODERM) 5 % Place 1 patch onto the skin daily. Remove & Discard patch within 12 hours or as directed by MD Place over left lower back. 10/09/18   Deliah [place], PA-C  linaclotide (LINZESS) 145 MCG CAPS capsule Take 145 mcg by mouth daily before breakfast.    [provider]  metoprolol succinate (TOPROL-XL) 50 MG 24 hr tablet TAKE 1 TABLET BY MOUTH EVERY DAY 08/19/22   Glendale Chard, MD  rosuvastatin (CRESTOR) 10 MG tablet TAKE 1 TABLET BY MOUTH EVERY DAY 11/07/22   Glendale Chard, MD  Semaglutide,0.25 or 0.'5MG'$ /DOS, (OZEMPIC, 0.25 OR 0.5 MG/DOSE,) 2 MG/3ML SOPN Inject 0.25 mg into the skin once a week. 06/28/22   Glendale Chard, MD  sildenafil (VIAGRA) 50 MG tablet Take 1 tablet (50 mg total) by mouth daily as needed for erectile dysfunction. 01/23/15   Lorretta Harp, MD  telmisartan (MICARDIS) 20 MG tablet  TAKE 1 TABLET BY MOUTH EVERY DAY 03/18/22   Glendale Chard, MD  triamcinolone cream (KENALOG) 0.1 % APPLY TO AFFECTED AREA on hand TWICE DAILY AS NEEDED 03/24/22   Glendale Chard, MD      Allergies    Patient has no known allergies.    Review of Systems   Review of Systems  Cardiovascular:  Positive for chest pain.    Physical Exam Updated Vital Signs BP 129/69 (BP Location: Right Arm)   Pulse (!) 59   Temp 98.3 F (36.8 C) (Oral)   Resp 16   Ht '5\' 10"'$  (1.778 m)   Wt 73.5 kg   SpO2 98%   BMI 23.24 kg/m  Physical Exam Constitutional: Alert and oriented.  Intermittently belching but nontoxic, no acute distress  eyes: Conjunctivae are  normal. ENT      Head: Normocephalic and atraumatic.      Nose: No congestion.      Mouth/Throat: Mucous membranes are moist.      Neck: No stridor. Cardiovascular: S1, S2, equal palpable radial and DP pulses.Warm and well perfused. Respiratory: Normal respiratory effort. Breath sounds are normal.  O2 sat 98 on RA Gastrointestinal: Soft and nontender.  Musculoskeletal: Normal range of motion in all extremities. No spinal ttp of C/T/L spine      Right lower leg: No tenderness or edema.      Left lower leg: No tenderness or edema. Neurologic: Normal speech and language.  No facial droop.  Equal strength upper and lower extremities.  Sensation grossly intact.  No gross focal neurologic deficits are appreciated. Skin: Skin is warm, dry and intact. No rash noted. Psychiatric: Mood and affect are normal. Speech and behavior are normal.  ED Results / Procedures / Treatments   Labs (all labs ordered are listed, but only abnormal results are displayed) Labs Reviewed  BASIC METABOLIC PANEL - Abnormal; Notable for the following components:      Result Value   Glucose, Bld 129 (*)    Creatinine, Ser 1.45 (*)    GFR, Estimated 49 (*)    All other components within normal limits  CBC  HEPATIC FUNCTION PANEL  LIPASE, BLOOD  TROPONIN I (HIGH SENSITIVITY)  TROPONIN I (HIGH SENSITIVITY)  TROPONIN I (HIGH SENSITIVITY)    EKG EKG Interpretation  Date/Time:  Thursday November 24 2022 13:26:42 EST Ventricular Rate:  58 PR Interval:  232 QRS Duration: 136 QT Interval:  434 QTC Calculation: 426 R Axis:   -72 Text Interpretation: Sinus bradycardia with 1st degree A-V block Right bundle branch block Left anterior fascicular block  Bifascicular block  Abnormal ECG New BBB from 2011 Nonspecific T wave inversion lead III and V3 When compared with ECG of 26-Feb-2010 11:03, PREVIOUS ECG IS PRESENT Confirmed by Georgina Snell [phone]) on 11/24/2022 1:46:34 PM  Radiology DG Chest Port 1 View  Result  Date: 11/23/2022 CLINICAL DATA:  Chest pain for 1 week EXAM: PORTABLE CHEST 1 VIEW COMPARISON:  X-ray 02/26/2010 FINDINGS: Status post median sternotomy. Calcified tortuous aorta. Normal cardiopericardial silhouette. Overlapping cardiac leads. No consolidation, pneumothorax, effusion or edema. IMPRESSION: Postop chest.  No acute cardiopulmonary disease Electronically Signed   By: Jill Side M.D.   On: 11/23/2022 18:12    Procedures Procedures    Medications Ordered in ED Medications  alum & mag hydroxide-simeth (MAALOX/MYLANTA) 200-200-20 MG/5ML suspension 30 mL (30 mLs Oral Given 11/24/22 1349)  ondansetron (ZOFRAN-ODT) disintegrating tablet 4 mg (4 mg Oral Given 11/24/22 1349)    ED Course/  Medical Decision Making/ A&P Clinical Course as of 11/24/22 1518  Thu Nov 24, 2022  1357 S/w Dr Stanford Breed who  [VB]    Clinical Course User Index [VB] Elgie Congo, MD                           Medical Decision Making TALLON GERTZ is a 82 y.o. male.  With PMH of CAD status post CABG, DM2, HTN, HLD, CKD who presents with intermittent chest pain and discomfort associated with belching.  Regarding the patient's chest pain, their HEART score is 6 and overall have an EKG with new bifascicular block and nonspecific T wave changes however last EKG obtained over.  Chest x-ray obtained which I personally evaluated which showed no evidence for pneumonia, pneumothorax, and pulmonary edema. I do not think aortic dissection as the patient is well-appearing, does not have ripping/tearing pain and equally has no pulse or neurologic deficits. They have had no recent instrumentation such as endoscopy, have no crepitus of the chest and are overall well appearing making Booerhave's unlikely.  No abdominal tenderness, no transaminitis normal lipase, unlikely biliary or pancreatic pathology.  Although patient's symptoms are atypical in nature and likely are GI related, he does have heart score 6 and has not had a  stress test in over 10 years. Hs Trop x 3 reassuring. EKG with new nonspecific changes, but also last obtained here >10 years ago.  I did stress the importance that he have likely inpatient stress test and cardiology evaluation as he tried calling his own cardiologist office and they cannot see him until April. I would prefer he be seen sooner than April. He is concerned for his dog and house where he has left crockpot on. He was signed out to Dr Maylon Peppers pending rpt discussion of inpatient obs/cardiology evaluation in AM vs discharge with outpatient referral.     Amount and/or Complexity of Data Reviewed Labs: ordered.  Risk OTC drugs. Prescription drug management.    Final Clinical Impression(s) / ED Diagnoses Final diagnoses:  Chest pain, unspecified type    Rx / DC Orders ED Discharge Orders          Ordered    Ambulatory referral to Cardiology       Comments: If you have not heard from the Cardiology office within the next 72 hours please call [phone].   11/24/22 1514    ondansetron (ZOFRAN-ODT) 4 MG disintegrating tablet  Every 8 hours PRN        11/24/22 1517    alum & mag hydroxide-simeth (MAALOX MAX) 062-694-85 MG/5ML suspension  Every 6 hours PRN        11/24/22 1517              Elgie Congo, MD 11/24/22 [phone]

## 2022-11-24 NOTE — Discharge Instructions (Addendum)
You were seen in the emergency department today for chest pain. You workup did not reveal a definite cause of your symptoms but was generally reassuring.   Return to the emergency department immediately if you develop recurrent, severe chest pain, shortness of breath, fainting spells, sudden sweatiness, or any other concerning symptoms.   Please also make an appointment to follow up with your primary care doctor or cardiologist within one week to assure improvement or resolution in symptoms. Further testing may be necessary, so it is extremely important to keep your follow-up appointment with your primary doctor.   If you have not heard from the Cardiology office within the next 72 hours please call [phone].

## 2022-11-24 NOTE — ED Provider Notes (Signed)
Patient signed out to me at 1500 by Dr. Nechama Guard pending disposition.  In short this is an 82 year old male with a past medical history of CAD status post CABG, hypertension and diabetes that presented to the emergency department with chest pain for the last 2 days.  Patient was initially evaluated with EKG that showed a new right bundle branch block with left anterior fascicular block however negative troponins x 3 and last EKG in our system was from 10 years ago.  Patient was treated with a GI cocktail and is now chest pain-free, however he has a heart score of 6 and has not had significant cardiac evaluation in over 10 years and was recommended observation for his high risk chest pain.  Patient is agreeable for admission.  Cardiologist on-call plans to reevaluate him in the morning.   Kemper Durie, DO 11/24/22 1616

## 2022-11-24 NOTE — Telephone Encounter (Signed)
Patient is currently admitted at [facility]. Provider in ED is recommending having a stress test and keeping the patient over night, but patient does not want to do so. Patient would prefer advisement/follow-up with Dr. Oval Linsey if at all possible. Patient mentions that he has been prescribed Zolfran and Maalox. Patient is hopeful for a call back to discuss as soon as possible.

## 2022-11-24 NOTE — Consult Note (Addendum)
Initial Consultation Note   Patient: Jose Ponce RDE:[id number] DOB: 1941/03/17 PCP: Glendale Chard, MD DOA: 11/23/2022 DOS: the patient was seen and examined on 11/24/2022 Primary service: Samuella Cota, MD  Referring physician: Dr. Maylon Peppers Reason for consult: chest pain  Assessment/Plan: Atypical chest pain Coronary artery disease, status post CABG Chronic right bundle branch block Troponins negative, EKG is nonacute and right bundle branch block and left anterior fascicular block are both old.  No acute changes are seen.  Chest x-ray no acute disease.  Laboratory workup is unrevealing.  His exam is benign. His history is highly suspicious for a GI etiology, specifically reflux.  He has significant belching even during examination and conversation, he recently changed to a more acidic coffee, his symptoms are not associated with or aggravated by activity and he is fairly active at baseline. Given his reassuring workup, examination and highly suggestive history, I recommend discharge home with outpatient follow-up. I reviewed case with Dr. Stanford Breed who concurred.  I have placed an ambulatory referral for follow-up with Dr. Oval Linsey or one of her partners.  I have counseled the patient to return for recurrence of symptoms. Continue aspirin, statin, beta-blocker  Diabetes mellitus type 2 Appears stable.  Continue Farxiga, Ozempic  CKD stage IIIa stable  Gout Continue allopurinol   TRH will sign off at present, please call us again when needed.  HPI: 82 year old man PMH including CAD, CABG, diabetes mellitus type 2 on oral therapy, presented to the emergency department with 2-day history of chest pain.  EDP was concerned about right bundle branch block which was reportedly new, discussed with cardiology and recommended observation.  I was called to evaluate the patient further.  Patient reports a 2-day history of chest pain beginning 1/2 and recurring 1/3.  Pain lasted for perhaps  15 minutes on those 2 days, was across his chest, 7 at its most intense, and also associated with some back pain.  It was improved with some medication he took for gas at home and markedly improved here in the emergency department after being given Maalox and Zofran.  He continues to have significant belching now.  He has been having belching at home.  No specific aggravating factors.  He is fairly active, no recent chest pain or shortness of breath.  After further thought, he recalls that he recently in the last week started a new coffee which is more acidic.  Review of Systems: Negative for fever, visual changes, sore throat, rash, new muscle aches, shortness of breath, dysuria, bleeding, vomiting, abdominal pain.  Bowels are regular at baseline.  Past Medical History:  Diagnosis Date   Chronic kidney disease    Coronary artery disease    Gout    Hyperlipidemia    Hypertension    Prostate cancer ([place])    Type 2 diabetes mellitus ([place])    Past Surgical History:  Procedure Laterality Date   CARDIAC CATHETERIZATION  01/16/2004   multi-vessel coronary disease 40-50% Left main, 100% mid LAD75-80% ostial stenosis   CARDIOVASCULAR STRESS TEST  08/09/2004   post CABG, low risk scan   CARDIOVASCULAR STRESS TEST  10/23/2003   mild septal ischemia, EF43%,    CORONARY ARTERY BYPASS GRAFT  02/05/2004   CABGx3 Dr.Bartle   Lower Ext. Doppler  10/20/2011   Right Anterior Tibial: occlusive disease, Left Anterior Tibial occluded w/reconstitution at ankle, mildly abnormal LEA   NM MYOCAR PERF WALL MOTION  06/07/2011   protocol:Persantine, EF60%, negative for ischemia, low risk scan  NM MYOCAR PERF WALL MOTION  08/05/2009   protocol: Persantine, EF57%. no evidence of ischemia, low risk scan.    TRANSPERINEAL IMPLANT OF RADIATION SEEDS W/ ULTRASOUND     Social History:  reports that he quit smoking about 38 years ago. His smoking use included cigarettes. He has a 5.00 pack-year smoking history. He  has never used smokeless tobacco. He reports that he does not currently use alcohol. He reports that he does not currently use drugs.  No Known Allergies  Family History  Problem Relation Age of Onset   Asthma Mother    Heart disease Father    Stroke Father    Heart attack Father 32   Cancer Sister 75   Diabetes Brother    Heart disease Maternal Grandmother     Prior to Admission medications   Medication Sig Start Date End Date Taking? Authorizing Provider  alum & mag hydroxide-simeth (MAALOX MAX) 400-400-40 MG/5ML suspension Take 5 mLs by mouth every 6 (six) hours as needed for indigestion. 11/24/22  Yes Elgie Congo, MD  calcium carbonate (TUMS) 500 MG chewable tablet Chew 1 tablet (200 mg of elemental calcium total) by mouth 3 (three) times daily as needed for indigestion or heartburn. 11/24/22 11/24/23 Yes Samuella Cota, MD  ondansetron (ZOFRAN-ODT) 4 MG disintegrating tablet Take 1 tablet (4 mg total) by mouth every 8 (eight) hours as needed for nausea or vomiting. 11/24/22  Yes Elgie Congo, MD  allopurinol (ZYLOPRIM) 100 MG tablet TAKE 1/2 TABLET BY MOUTH EVERY DAY 11/07/22   Glendale Chard, MD  aspirin 81 MG tablet Take 81 mg by mouth daily.    [provider]  Cholecalciferol 125 MCG (5000 UT) CHEW Chew 5,000 Units by mouth.     [provider]  dapagliflozin propanediol (FARXIGA) 5 MG TABS tablet Take by mouth daily.    [provider]  Diclofenac Sodium 3 % GEL Place onto the skin.    [provider]  Flaxseed, Linseed, (FLAX SEEDS PO) Take 1 tablet by mouth daily.    [provider]  ketoconazole (NIZORAL) 2 % cream Apply 1 application topically daily.    [provider]  lidocaine (LIDODERM) 5 % Place 1 patch onto the skin daily. Remove & Discard patch within 12 hours or as directed by MD Place over left lower back. 10/09/18   Deliah Boston, PA-C  linaclotide (LINZESS) 145 MCG CAPS capsule Take 145 mcg by  mouth daily before breakfast.    [provider]  metoprolol succinate (TOPROL-XL) 50 MG 24 hr tablet TAKE 1 TABLET BY MOUTH EVERY DAY 08/19/22   Glendale Chard, MD  rosuvastatin (CRESTOR) 10 MG tablet TAKE 1 TABLET BY MOUTH EVERY DAY 11/07/22   Glendale Chard, MD  Semaglutide,0.25 or 0.'5MG'$ /DOS, (OZEMPIC, 0.25 OR 0.5 MG/DOSE,) 2 MG/3ML SOPN Inject 0.25 mg into the skin once a week. 06/28/22   Glendale Chard, MD  sildenafil (VIAGRA) 50 MG tablet Take 1 tablet (50 mg total) by mouth daily as needed for erectile dysfunction. 01/23/15   Lorretta Harp, MD  telmisartan (MICARDIS) 20 MG tablet TAKE 1 TABLET BY MOUTH EVERY DAY 03/18/22   Glendale Chard, MD  triamcinolone cream (KENALOG) 0.1 % APPLY TO AFFECTED AREA on hand TWICE DAILY AS NEEDED 03/24/22   Glendale Chard, MD    Physical Exam: Vitals:   11/23/22 2037 11/24/22 0731 11/24/22 1107 11/24/22 1405  BP: (!) 119/103 (!) 132/91 135/78 129/69  Pulse: (!) 59 75 79 (!) 59  Resp: '17 17 17 16  '$ Temp: 98.3 F (36.8 C) 97.7 F (36.5 C) 98.3 F (36.8 C) 98.3 F (36.8 C)  TempSrc: Oral Oral  Oral  SpO2: 99% 98% 98% 98%  Weight:      Height:       Physical Exam Vitals reviewed.  Constitutional:      General: He is not in acute distress.    Appearance: He is not ill-appearing or toxic-appearing.  Cardiovascular:     Rate and Rhythm: Normal rate and regular rhythm.     Heart sounds: No murmur heard. Pulmonary:     Effort: Pulmonary effort is normal. No respiratory distress.     Breath sounds: No wheezing, rhonchi or rales.  Abdominal:     General: Abdomen is flat. There is distension.     Palpations: Abdomen is soft.     Tenderness: There is no abdominal tenderness.  Musculoskeletal:     Right lower leg: No edema.     Left lower leg: No edema.     Comments: Stands easily without assistance  Neurological:     Mental Status: He is alert.  Psychiatric:        Mood and Affect: Mood normal.        Behavior: Behavior normal.      Data Reviewed:  Creatinine stable 1.45 on 1/3 CBC 1/3 WNL Hepatic function panel WNL SB 1st degree AVF, RBBB, LAFB; compared to 03/07/22 EKG, NSCSLT Low risk nuclear scan 06/07/2011     Family Communication: none present or requested Primary team communication: discussed with Dr. Stanford Breed who reviewed the EKG (nonacute) and with requesting physician Dr. Maylon Peppers.  Thank you very much for involving [place] in the care of your patient.  Author: Murray Hodgkins, MD 11/24/2022 5:37 PM  For on call review [email or web address].

## 2022-11-24 NOTE — Telephone Encounter (Addendum)
Returned call to patient and advised him that he needed to listen to the hospital MD, but ultimately that choice is up to him. Hospital MD who consulted patient today notified via secure chat!

## 2022-11-25 ENCOUNTER — Other Ambulatory Visit: Payer: Self-pay | Admitting: Internal Medicine

## 2022-11-25 ENCOUNTER — Telehealth: Payer: Self-pay

## 2022-11-25 NOTE — Telephone Encounter (Signed)
Transition Care Management Unsuccessful Follow-up Telephone Call  Date of discharge and from where:  11/24/2021 [facility]   Attempts:  2nd Attempt  Reason for unsuccessful TCM follow-up call:  Left voice message

## 2022-11-25 NOTE — Telephone Encounter (Signed)
Transition Care Management Unsuccessful Follow-up Telephone Call  Date of discharge and from where:  11/24/2021 [place]   Attempts:  1st Attempt  Reason for unsuccessful TCM follow-up call:  Left voice message

## 2022-11-28 ENCOUNTER — Telehealth: Payer: Self-pay

## 2022-11-28 ENCOUNTER — Other Ambulatory Visit: Payer: Self-pay

## 2022-11-28 NOTE — Telephone Encounter (Signed)
Transition Care Management Unsuccessful Follow-up Telephone Call  Date of discharge and from where:  11/24/2021 [facility]   Attempts:  3rd Attempt  Reason for unsuccessful TCM follow-up call:  Left voice message

## 2022-11-28 NOTE — Telephone Encounter (Signed)
Transition Care Management Follow-up Telephone Call Date of discharge and from where: 11/24/2022 [facility]  How have you been since you were released from the hospital? Pt states he is fine.  Any questions or concerns? No  Items Reviewed: Did the pt receive and understand the discharge instructions provided? Yes  Medications obtained and verified? Yes  Other? Yes  Any new allergies since your discharge? No  Dietary orders reviewed? Yes Do you have support at home? Yes   Home Care and Equipment/Supplies: Were home health services ordered? no If so, what is the name of the agency? N/a  Has the agency set up a time to come to the patient's home? no Were any new equipment or medical supplies ordered?  No What is the name of the medical supply agency? N/a Were you able to get the supplies/equipment? no Do you have any questions related to the use of the equipment or supplies? No  Functional Questionnaire: (I = Independent and D = Dependent) ADLs: i  Bathing/Dressing- i  Meal Prep- i  Eating- i  Maintaining continence- i  Transferring/Ambulation- i  Managing Meds- i  Follow up appointments reviewed:  PCP Hospital f/u appt confirmed? Yes  Scheduled to see robyn sanders on 11/29/2021 @ [facility]. [facility] f/u appt confirmed? No  Scheduled to see n/a on n/a @ n/a. Are transportation arrangements needed? No  If their condition worsens, is the pt aware to call PCP or go to the Emergency Dept.? Yes Was the patient provided with contact information for the PCP's office or ED? Yes Was to pt encouraged to call back with questions or concerns? Yes

## 2022-11-29 ENCOUNTER — Ambulatory Visit (INDEPENDENT_AMBULATORY_CARE_PROVIDER_SITE_OTHER): Payer: Medicare PPO | Admitting: Internal Medicine

## 2022-11-29 ENCOUNTER — Other Ambulatory Visit: Payer: Self-pay | Admitting: Internal Medicine

## 2022-11-29 ENCOUNTER — Encounter: Payer: Self-pay | Admitting: Internal Medicine

## 2022-11-29 VITALS — BP 118/80 | HR 58 | Ht 70.0 in | Wt 177.0 lb

## 2022-11-29 DIAGNOSIS — R0789 Other chest pain: Secondary | ICD-10-CM

## 2022-11-29 DIAGNOSIS — N1831 Chronic kidney disease, stage 3a: Secondary | ICD-10-CM | POA: Diagnosis not present

## 2022-11-29 DIAGNOSIS — E1122 Type 2 diabetes mellitus with diabetic chronic kidney disease: Secondary | ICD-10-CM | POA: Diagnosis not present

## 2022-11-29 DIAGNOSIS — I25118 Atherosclerotic heart disease of native coronary artery with other forms of angina pectoris: Secondary | ICD-10-CM

## 2022-11-29 DIAGNOSIS — Z6825 Body mass index (BMI) 25.0-25.9, adult: Secondary | ICD-10-CM | POA: Diagnosis not present

## 2022-11-29 DIAGNOSIS — R0982 Postnasal drip: Secondary | ICD-10-CM | POA: Diagnosis not present

## 2022-11-29 DIAGNOSIS — I1310 Hypertensive heart and chronic kidney disease without heart failure, with stage 1 through stage 4 chronic kidney disease, or unspecified chronic kidney disease: Secondary | ICD-10-CM

## 2022-11-29 DIAGNOSIS — K219 Gastro-esophageal reflux disease without esophagitis: Secondary | ICD-10-CM | POA: Diagnosis not present

## 2022-11-29 MED ORDER — FAMOTIDINE 20 MG PO TABS: ORAL_TABLET | ORAL | 1 refills | Status: DC

## 2022-11-29 NOTE — Patient Instructions (Signed)

## 2022-11-29 NOTE — Progress Notes (Signed)
[name],acting as a Education administrator for [name], MD.,have documented all relevant documentation on the behalf of [name], MD,as directed by  [name], MD while in the presence of [name], MD.    Subjective:     Patient ID: Jose Ponce , male    DOB: Apr 15, 1941 , 82 y.o.   MRN: [id number]   Chief Complaint  Patient presents with   hospital f/u    HPI  Patient presents today for a hospital f/u. He went to [place] on 11/23/22 for further evaluation of chest pain. He does have a past medical history significant for  CAD status post CABG, hypertension and diabetes. He presented to ER c/o chest pain that started 2 days prior to presentation. Patient was initially evaluated with EKG that showed a new right bundle branch block with left anterior fascicular block. He did however have negative troponins x 3.  Patient was treated with a GI cocktail and is now chest pain-free, however he has a heart score of 6 and has not had significant cardiac evaluation in over 10 years and was recommended observation for his high risk chest pain.  Patient is agreeable for admission.  It was recommended that pt stay overnight for stress test the following day. However, he decided to leave and f/u with Cardiology as an outpatient. He was discharged with Zofran. It is thought his sx may be GI related.   He has not had any further episodes since discharge from ER.      Past Medical History:  Diagnosis Date   Chronic kidney disease    Coronary artery disease    Gout    Hyperlipidemia    Hypertension    Prostate cancer ([place])    Type 2 diabetes mellitus ([place])      Family History  Problem Relation Age of Onset   Asthma Mother    Heart disease Father    Stroke Father    Heart attack Father 78   Cancer Sister 18   Diabetes Brother    Heart disease Maternal Grandmother      Current Outpatient Medications:    allopurinol (ZYLOPRIM) 100 MG tablet, TAKE 1/2 TABLET BY  MOUTH EVERY DAY, Disp: 45 tablet, Rfl: 1   alum & mag hydroxide-simeth (MAALOX MAX) 400-400-40 MG/5ML suspension, Take 5 mLs by mouth every 6 (six) hours as needed for indigestion., Disp: 355 mL, Rfl: 0   aspirin 81 MG tablet, Take 81 mg by mouth daily., Disp: , Rfl:    calcium carbonate (TUMS) 500 MG chewable tablet, Chew 1 tablet (200 mg of elemental calcium total) by mouth 3 (three) times daily as needed for indigestion or heartburn., Disp: , Rfl: 3   Cholecalciferol 125 MCG (5000 UT) CHEW, Chew 5,000 Units by mouth. , Disp: , Rfl:    dapagliflozin propanediol (FARXIGA) 5 MG TABS tablet, Take by mouth daily., Disp: , Rfl:    Diclofenac Sodium 3 % GEL, Place onto the skin., Disp: , Rfl:    Flaxseed, Linseed, (FLAX SEEDS PO), Take 1 tablet by mouth daily., Disp: , Rfl:    ketoconazole (NIZORAL) 2 % cream, Apply 1 application topically daily., Disp: , Rfl:    lidocaine (LIDODERM) 5 %, Place 1 patch onto the skin daily. Remove & Discard patch within 12 hours or as directed by MD Place over left lower back., Disp: 30 patch, Rfl: 0   linaclotide (LINZESS) 145 MCG CAPS capsule, Take 145 mcg by mouth  daily before breakfast., Disp: , Rfl:    metoprolol succinate (TOPROL-XL) 50 MG 24 hr tablet, TAKE 1 TABLET BY MOUTH EVERY DAY, Disp: 90 tablet, Rfl: 1   ondansetron (ZOFRAN-ODT) 4 MG disintegrating tablet, Take 1 tablet (4 mg total) by mouth every 8 (eight) hours as needed for nausea or vomiting., Disp: 20 tablet, Rfl: 0   rosuvastatin (CRESTOR) 10 MG tablet, TAKE 1 TABLET BY MOUTH EVERY DAY, Disp: 90 tablet, Rfl: 1   Semaglutide,0.25 or 0.'5MG'$ /DOS, (OZEMPIC, 0.25 OR 0.5 MG/DOSE,) 2 MG/3ML SOPN, Inject 0.25 mg into the skin once a week., Disp: 6 mL, Rfl: 1   sildenafil (VIAGRA) 50 MG tablet, Take 1 tablet (50 mg total) by mouth daily as needed for erectile dysfunction., Disp: 30 tablet, Rfl: 0   telmisartan (MICARDIS) 20 MG tablet, TAKE 1 TABLET BY MOUTH EVERY DAY, Disp: 90 tablet, Rfl: 1   triamcinolone  cream (KENALOG) 0.1 %, APPLY TO AFFECTED AREA on hand TWICE DAILY AS NEEDED, Disp: 45 g, Rfl: 0   famotidine (PEPCID) 20 MG tablet, TAKE 1 TABLET BY MOUTH EVERY DAY, Disp: 90 tablet, Rfl: 1  Current Facility-Administered Medications:    cyanocobalamin ((VITAMIN B-12)) injection 1,000 mcg, 1,000 mcg, Intramuscular, Once, Glendale Chard, MD   No Known Allergies   Review of Systems  Constitutional: Negative.   HENT:  Positive for postnasal drip.   Eyes: Negative.   Respiratory: Negative.    Cardiovascular: Negative.   Gastrointestinal: Negative.   Musculoskeletal: Negative.   Skin: Negative.   Neurological: Negative.   Psychiatric/Behavioral: Negative.       Today's Vitals   11/29/22 1240  BP: 118/80  Pulse: (!) 58  Weight: 177 lb (80.3 kg)  Height: '5\' 10"'$  (1.778 m)  PainSc: 0-No pain   Body mass index is 25.4 kg/m.  Wt Readings from Last 3 Encounters:  11/29/22 177 lb (80.3 kg)  11/23/22 162 lb (73.5 kg)  11/10/22 163 lb (73.9 kg)     Objective:  Physical Exam Vitals and nursing note reviewed.  Constitutional:      Appearance: Normal appearance.  HENT:     Head: Normocephalic and atraumatic.  Cardiovascular:     Rate and Rhythm: Normal rate and regular rhythm.     Heart sounds: Normal heart sounds.  Pulmonary:     Effort: Pulmonary effort is normal.     Breath sounds: Normal breath sounds.  Musculoskeletal:     Cervical back: Normal range of motion.  Skin:    General: Skin is warm.  Neurological:     General: No focal deficit present.     Mental Status: He is alert.  Psychiatric:        Mood and Affect: Mood normal.      Assessment And Plan:     1. Atypical chest pain Comments: ER records reviewed. He is encouraged to f/u w/ Cardiology as scheduled. I will send rx famotidine to take once daily, sx may be GI related.  2. Gastroesophageal reflux disease without esophagitis Comments: I will send rx famotidine '20mg'$  daily. Reminded to stop eating 3 hrs  prior to lying down.  3. Postnasal drip Comments: He can try loratadine '10mg'$  daily. He may also benefit from avoiding dairy.  4. Benign hypertensive heart and renal disease Comments: Chronic, controlled. Encouraged to follow heart healthy lifestyle.  5. Coronary artery disease involving native coronary artery of native heart with other form of angina pectoris ([place]) Comments: Chronic, s/p CABG. He is encouraged to keep all Cardiology  appts.  6. Type 2 diabetes mellitus with stage 3a chronic kidney disease, without long-term current use of insulin ([place]) Comments: Chronic, he is advised that semaglutide may be worsening his GI sx/reflux. May need to change dosing schedule and increase SGLT2 inhibitor.  7. BMI 25.0-25.9,adult Comments: He is encouraged to aim for at least 150 minutes of exercise/week.   Patient was given opportunity to ask questions. Patient verbalized understanding of the plan and was able to repeat key elements of the plan. All questions were answered to their satisfaction.   I, [name], MD, have reviewed all documentation for this visit. The documentation on 11/29/22 for the exam, diagnosis, procedures, and orders are all accurate and complete.   IF YOU HAVE BEEN REFERRED TO A SPECIALIST, IT MAY TAKE 1-2 WEEKS TO SCHEDULE/PROCESS THE REFERRAL. IF YOU HAVE NOT HEARD FROM US/SPECIALIST IN TWO WEEKS, PLEASE GIVE [place] A CALL AT [phone] X [phone].   THE PATIENT IS ENCOURAGED TO PRACTICE SOCIAL DISTANCING DUE TO THE COVID-19 PANDEMIC.

## 2022-12-04 NOTE — Progress Notes (Deleted)
Cardiology Office Note:    Date:  12/04/2022   ID:  Jose Ponce, DOB 03/01/1941, MRN [id number]  PCP:  Glendale Chard, [place] [facility] Cardiologist:  Quay Burow, MD { Click to update primary MD,subspecialty MD or APP then REFRESH:1}    Referring MD: Samuella Cota, MD   No chief complaint on file. ***  History of Present Illness:    Jose Ponce is a 82 y.o. male with a hx of coronary artery disease, CABG (LIMA-->LAD, SVG-->D, SVG-->OM, SVG-->LCx in 2005), hypertension, hyperlipidemia, stage IIIa CKD, type 2 diabetes mellitus.  He was last seen in our office on 03/07/22 by Dr. Oval Linsey, at that time he was doing relatively well from a cardiac perspective.  Occasional issues with reflux.   11/23/2022 he presented to the emergency department with chest pain for 2 days.  EKG showed a new RBBB with LA FB, troponins were negative x 3.  He was treated with a GI cocktail and initially agreed to be admitted however ultimately decided to discharge and follow-up with cardiology.    **stress testing -  Past Medical History:  Diagnosis Date   Chronic kidney disease    Coronary artery disease    Gout    Hyperlipidemia    Hypertension    Prostate cancer ([place])    Type 2 diabetes mellitus ([place])     Past Surgical History:  Procedure Laterality Date   CARDIAC CATHETERIZATION  01/16/2004   multi-vessel coronary disease 40-50% Left main, 100% mid LAD75-80% ostial stenosis   CARDIOVASCULAR STRESS TEST  08/09/2004   post CABG, low risk scan   CARDIOVASCULAR STRESS TEST  10/23/2003   mild septal ischemia, EF43%,    CORONARY ARTERY BYPASS GRAFT  02/05/2004   CABGx3 Dr.Bartle   Lower Ext. Doppler  10/20/2011   Right Anterior Tibial: occlusive disease, Left Anterior Tibial occluded w/reconstitution at ankle, mildly abnormal LEA   NM MYOCAR PERF WALL MOTION  06/07/2011   protocol:Persantine, EF60%, negative for ischemia, low risk scan   NM MYOCAR PERF WALL  MOTION  08/05/2009   protocol: Persantine, EF57%. no evidence of ischemia, low risk scan.    TRANSPERINEAL IMPLANT OF RADIATION SEEDS W/ ULTRASOUND      Current Medications: No outpatient medications have been marked as taking for the 12/05/22 encounter (Appointment) with Loel Dubonnet, NP.   Current Facility-Administered Medications for the 12/05/22 encounter (Appointment) with Loel Dubonnet, NP  Medication   cyanocobalamin ((VITAMIN B-12)) injection 1,000 mcg     Allergies:   Patient has no known allergies.   Social History   Socioeconomic History   Marital status: Divorced    Spouse name: Not on file   Number of children: Not on file   Years of education: Not on file   Highest education level: Not on file  Occupational History   Occupation: retired  Tobacco Use   Smoking status: Former    Packs/day: 0.25    Years: 20.00    Total pack years: 5.00    Types: Cigarettes    Quit date: 11/21/1984    Years since quitting: 38.0   Smokeless tobacco: Never  Vaping Use   Vaping Use: Never used  Substance and Sexual Activity   Alcohol use: Not Currently   Drug use: Not Currently   Sexual activity: Yes  Other Topics Concern   Not on file  Social History Narrative   Not on file   Social Determinants of Health  Financial Resource Strain: Low Risk  (01/05/2022)   Overall Financial Resource Strain (CARDIA)    Difficulty of Paying Living Expenses: Not hard at all  Food Insecurity: No Food Insecurity (01/05/2022)   Hunger Vital Sign    Worried About Running Out of Food in the Last Year: Never true    Ran Out of Food in the Last Year: Never true  Transportation Needs: No Transportation Needs (01/05/2022)   PRAPARE - Hydrologist (Medical): No    Lack of Transportation (Non-Medical): No  Physical Activity: Insufficiently Active (01/05/2022)   Exercise Vital Sign    Days of Exercise per Week: 3 days    Minutes of Exercise per Session: 20 min   Stress: No Stress Concern Present (01/05/2022)   [place]    Feeling of Stress : Not at all  Social Connections: Moderately Integrated (08/25/2021)   Social Connection and Isolation Panel [NHANES]    Frequency of Communication with Friends and Family: More than three times a week    Frequency of Social Gatherings with Friends and Family: More than three times a week    Attends Religious Services: More than 4 times per year    Active Member of [employer] or Organizations: Yes    Attends Archivist Meetings: 1 to 4 times per year    Marital Status: Divorced     Family History: The patient's ***family history includes Asthma in his mother; Cancer (age of onset: 76) in his sister; Diabetes in his brother; Heart attack (age of onset: 72) in his father; Heart disease in his father and maternal grandmother; Stroke in his father.  ROS:   Please see the history of present illness.    *** All other systems reviewed and are negative.  EKGs/Labs/Other Studies Reviewed:    The following studies were reviewed today:  06/07/11 myocardial perfusion - normal exam, no ischemia  EKG:  EKG is *** ordered today.  The ekg ordered today demonstrates ***  Recent Labs: 03/24/2022: TSH 2.200 11/23/2022: BUN 17; Creatinine, Ser 1.45; Hemoglobin 15.0; Platelets 195; Potassium 4.2; Sodium 140 11/24/2022: ALT 18  Recent Lipid Panel    Component Value Date/Time   CHOL 123 03/28/2022 1222   TRIG 71 03/28/2022 1222   HDL 52 03/28/2022 1222   CHOLHDL 2.4 03/28/2022 1222   LDLCALC 56 03/28/2022 1222     Risk Assessment/Calculations:   {Does this patient have ATRIAL FIBRILLATION?:[phone]}  No BP recorded.  {Refresh Note OR Click here to enter BP  :1}***         Physical Exam:    VS:  There were no vitals taken for this visit.    Wt Readings from Last 3 Encounters:  11/29/22 177 lb (80.3 kg)  11/23/22 162 lb (73.5 kg)  11/10/22  163 lb (73.9 kg)     GEN: *** Well nourished, well developed in no acute distress HEENT: Normal NECK: No JVD; No carotid bruits LYMPHATICS: No lymphadenopathy CARDIAC: ***RRR, no murmurs, rubs, gallops RESPIRATORY:  Clear to auscultation without rales, wheezing or rhonchi  ABDOMEN: Soft, non-tender, non-distended MUSCULOSKELETAL:  No edema; No deformity  SKIN: Warm and dry NEUROLOGIC:  Alert and oriented x 3 PSYCHIATRIC:  Normal affect   ASSESSMENT:    No diagnosis found. PLAN:    In order of problems listed above:  Chest pain Coronary artery disease Hypertension Hyperlipidemia      {Are you ordering a CV Procedure (e.g.  stress test, cath, DCCV, TEE, etc)?   Press F2        :[id number]    Medication Adjustments/Labs and Tests Ordered: Current medicines are reviewed at length with the patient today.  Concerns regarding medicines are outlined above.  No orders of the defined types were placed in this encounter.  No orders of the defined types were placed in this encounter.   There are no Patient Instructions on file for this visit.   Signed, Trudi Ida, NP  12/04/2022 9:44 AM    [place]

## 2022-12-05 ENCOUNTER — Ambulatory Visit (HOSPITAL_BASED_OUTPATIENT_CLINIC_OR_DEPARTMENT_OTHER): Payer: Medicare PPO | Admitting: Cardiology

## 2022-12-05 DIAGNOSIS — I10 Essential (primary) hypertension: Secondary | ICD-10-CM

## 2022-12-05 DIAGNOSIS — E7800 Pure hypercholesterolemia, unspecified: Secondary | ICD-10-CM

## 2022-12-05 DIAGNOSIS — I2510 Atherosclerotic heart disease of native coronary artery without angina pectoris: Secondary | ICD-10-CM

## 2022-12-05 DIAGNOSIS — R079 Chest pain, unspecified: Secondary | ICD-10-CM

## 2022-12-20 ENCOUNTER — Ambulatory Visit (HOSPITAL_BASED_OUTPATIENT_CLINIC_OR_DEPARTMENT_OTHER): Payer: Medicare PPO | Admitting: Family

## 2022-12-20 NOTE — Progress Notes (Deleted)
Office Visit    Patient Name: Jose Ponce Date of Encounter: 12/20/2022  PCP:  Glendale Chard, [facility]  Cardiologist:  Quay Burow, MD  Advanced Practice Provider:  No care team member to display Electrophysiologist:  None      Chief Complaint    Jose Ponce is a 82 y.o. male presents today for chest pain   Past Medical History    Past Medical History:  Diagnosis Date   Chronic kidney disease    Coronary artery disease    Gout    Hyperlipidemia    Hypertension    Prostate cancer ([place])    Type 2 diabetes mellitus ([place])    Past Surgical History:  Procedure Laterality Date   CARDIAC CATHETERIZATION  01/16/2004   multi-vessel coronary disease 40-50% Left main, 100% mid LAD75-80% ostial stenosis   CARDIOVASCULAR STRESS TEST  08/09/2004   post CABG, low risk scan   CARDIOVASCULAR STRESS TEST  10/23/2003   mild septal ischemia, EF43%,    CORONARY ARTERY BYPASS GRAFT  02/05/2004   CABGx3 Dr.Bartle   Lower Ext. Doppler  10/20/2011   Right Anterior Tibial: occlusive disease, Left Anterior Tibial occluded w/reconstitution at ankle, mildly abnormal LEA   NM MYOCAR PERF WALL MOTION  06/07/2011   protocol:Persantine, EF60%, negative for ischemia, low risk scan   NM MYOCAR PERF WALL MOTION  08/05/2009   protocol: Persantine, EF57%. no evidence of ischemia, low risk scan.    TRANSPERINEAL IMPLANT OF RADIATION SEEDS W/ ULTRASOUND      Allergies  No Known Allergies  History of Present Illness    Jose Ponce is a 82 y.o. male with a hx of appendectomy, hypertension, diabetes, hyperlipidemia last seen ***.  *Primary care provider 11/29/2022 after ED visit 11/23/2022 for chest pain.  ED visit with EKG with new right bundle branch block and LAFB, negative troponin 6 3.  Treated with GI cocktail. No further episodes since discharge.   EKGs/Labs/Other Studies Reviewed:   The following studies were reviewed today: ***  EKG:  EKG is  ordered today.  The ekg ordered today demonstrates ***  Recent Labs: 03/24/2022: TSH 2.200 11/23/2022: BUN 17; Creatinine, Ser 1.45; Hemoglobin 15.0; Platelets 195; Potassium 4.2; Sodium 140 11/24/2022: ALT 18  Recent Lipid Panel    Component Value Date/Time   CHOL 123 03/28/2022 1222   TRIG 71 03/28/2022 1222   HDL 52 03/28/2022 1222   CHOLHDL 2.4 03/28/2022 1222   LDLCALC 56 03/28/2022 1222    Risk Assessment/Calculations:  {Does this patient have ATRIAL FIBRILLATION?[phone]}  Home Medications   No outpatient medications have been marked as taking for the 12/20/22 encounter (Appointment) with Loel Dubonnet, NP.   Current Facility-Administered Medications for the 12/20/22 encounter (Appointment) with Loel Dubonnet, NP  Medication   cyanocobalamin ((VITAMIN B-12)) injection 1,000 mcg     Review of Systems      All other systems reviewed and are otherwise negative except as noted above.  Physical Exam    VS:  There were no vitals taken for this visit. , BMI There is no height or weight on file to calculate BMI.  Wt Readings from Last 3 Encounters:  11/29/22 177 lb (80.3 kg)  11/23/22 162 lb (73.5 kg)  11/10/22 163 lb (73.9 kg)     GEN: Well nourished, well developed, in no acute distress. HEENT: normal. Neck: Supple, no JVD, carotid bruits, or masses. Cardiac: ***RRR, no murmurs, rubs, or  gallops. No clubbing, cyanosis, edema.  ***Radials/PT 2+ and equal bilaterally.  Respiratory:  ***Respirations regular and unlabored, clear to auscultation bilaterally. GI: Soft, nontender, nondistended. MS: No deformity or atrophy. Skin: Warm and dry, no rash. Neuro:  Strength and sensation are intact. Psych: Normal affect.  Assessment & Plan    CAD s/p CABG -  HTN -  HLD, LDL goal <70 -   No BP recorded.  {Refresh Note OR Click here to enter BP  :1}***      Disposition: Follow up {follow up:15908} with Quay Burow, MD or APP.  Signed, Loel Dubonnet,  NP 12/20/2022, 1:47 PM [facility]

## 2022-12-22 ENCOUNTER — Ambulatory Visit (HOSPITAL_BASED_OUTPATIENT_CLINIC_OR_DEPARTMENT_OTHER): Payer: Medicare PPO | Admitting: Family

## 2023-01-03 ENCOUNTER — Telehealth: Payer: Self-pay

## 2023-01-03 NOTE — Telephone Encounter (Signed)
After obtaining consent, and per orders of Dr. Baird Cancer, injection of Ozempic given by Marrian Salvage. Patient instructed to remain in clinic for 20 minutes afterwards, and to report any adverse reaction to me immediately.

## 2023-01-12 ENCOUNTER — Encounter (HOSPITAL_BASED_OUTPATIENT_CLINIC_OR_DEPARTMENT_OTHER): Payer: Self-pay | Admitting: Cardiology

## 2023-01-12 ENCOUNTER — Ambulatory Visit (INDEPENDENT_AMBULATORY_CARE_PROVIDER_SITE_OTHER): Payer: Medicare PPO | Admitting: Cardiology

## 2023-01-12 VITALS — BP 120/70 | HR 57 | Ht 70.0 in | Wt 161.6 lb

## 2023-01-12 DIAGNOSIS — I2510 Atherosclerotic heart disease of native coronary artery without angina pectoris: Secondary | ICD-10-CM | POA: Diagnosis not present

## 2023-01-12 DIAGNOSIS — E7800 Pure hypercholesterolemia, unspecified: Secondary | ICD-10-CM

## 2023-01-12 DIAGNOSIS — I10 Essential (primary) hypertension: Secondary | ICD-10-CM

## 2023-01-12 NOTE — Progress Notes (Signed)
Cardiology Office Note:    Date:  01/12/2023   ID:  Jose, Ponce 11-06-1941, MRN [id number]  PCP:  Glendale Chard, MD   [place] [facility] Cardiologist:  Skeet Latch, MD     Referring MD: Samuella Cota, MD   No chief complaint on file.  History of Present Illness:    Jose Ponce is a 82 y.o. male with a hx of coronary artery disease (s/p CABG x 3 2005 LIMA > LAD, vein graft > diagonal branch, obtuse marginal > circumflex), hypertension, DM 2, CKD 3 AA, HLD, prostate cancer.   He initially establish care with our office in 2015 for follow up of his CAD.  Most recently he was evaluated in office by Dr. Oval Linsey on 03/07/2022, at that time he was doing well from a cardiac perspective and was instructed to follow-up in a year.  He was recently evaluated in the emergency department on 11/23/2022 for chest pain, pain had been present 2 days prior.  EKG showed a new R BBB with LAFB, troponins were negative.  He was treated with a GI cocktail and had no further complaints of pain.  It was recommended that he be admitted for evaluation however he decided he would prefer to discharge and follow-up with cardiology as an outpatient.  He presents today for follow up after recent evaluation in the ED for what turned out to be likely gas pain. He has not had any recurrence of the above issues. He has had some ongoing issues with bloating, flatulence and gas since starting Ozempic. He is on a good bowel regimen now including Linzess, which has helped with his symptoms. He denies chest pain, palpitations, dyspnea, pnd, orthopnea, n, v, dizziness, syncope, edema, weight gain, or early satiety.   Past Medical History:  Diagnosis Date   Chronic kidney disease    Coronary artery disease    Gout    Hyperlipidemia    Hypertension    Prostate cancer ([place])    Type 2 diabetes mellitus ([place])     Past Surgical History:  Procedure Laterality Date   CARDIAC CATHETERIZATION   01/16/2004   multi-vessel coronary disease 40-50% Left main, 100% mid LAD75-80% ostial stenosis   CARDIOVASCULAR STRESS TEST  08/09/2004   post CABG, low risk scan   CARDIOVASCULAR STRESS TEST  10/23/2003   mild septal ischemia, EF43%,    CORONARY ARTERY BYPASS GRAFT  02/05/2004   CABGx3 Dr.Bartle   Lower Ext. Doppler  10/20/2011   Right Anterior Tibial: occlusive disease, Left Anterior Tibial occluded w/reconstitution at ankle, mildly abnormal LEA   NM MYOCAR PERF WALL MOTION  06/07/2011   protocol:Persantine, EF60%, negative for ischemia, low risk scan   NM MYOCAR PERF WALL MOTION  08/05/2009   protocol: Persantine, EF57%. no evidence of ischemia, low risk scan.    TRANSPERINEAL IMPLANT OF RADIATION SEEDS W/ ULTRASOUND      Current Medications: Current Meds  Medication Sig   allopurinol (ZYLOPRIM) 100 MG tablet TAKE 1/2 TABLET BY MOUTH EVERY DAY   aspirin 81 MG tablet Take 81 mg by mouth daily.   Cholecalciferol 125 MCG (5000 UT) CHEW Chew 5,000 Units by mouth.    dapagliflozin propanediol (FARXIGA) 5 MG TABS tablet Take by mouth daily.   Diclofenac Sodium 3 % GEL Place onto the skin.   famotidine (PEPCID) 20 MG tablet TAKE 1 TABLET BY MOUTH EVERY DAY   Flaxseed, Linseed, (FLAX SEEDS PO) Take 1 tablet by mouth daily.  ketoconazole (NIZORAL) 2 % cream Apply 1 application topically daily.   lidocaine (LIDODERM) 5 % Place 1 patch onto the skin daily. Remove & Discard patch within 12 hours or as directed by MD Place over left lower back.   linaclotide (LINZESS) 145 MCG CAPS capsule Take 145 mcg by mouth daily before breakfast.   metoprolol succinate (TOPROL-XL) 50 MG 24 hr tablet TAKE 1 TABLET BY MOUTH EVERY DAY   rosuvastatin (CRESTOR) 10 MG tablet TAKE 1 TABLET BY MOUTH EVERY DAY   Semaglutide,0.25 or 0.5MG/DOS, (OZEMPIC, 0.25 OR 0.5 MG/DOSE,) 2 MG/3ML SOPN Inject 0.25 mg into the skin once a week.   sildenafil (VIAGRA) 50 MG tablet Take 1 tablet (50 mg total) by mouth daily as  needed for erectile dysfunction.   telmisartan (MICARDIS) 20 MG tablet TAKE 1 TABLET BY MOUTH EVERY DAY   triamcinolone cream (KENALOG) 0.1 % APPLY TO AFFECTED AREA on hand TWICE DAILY AS NEEDED   [DISCONTINUED] alum & mag hydroxide-simeth (MAALOX MAX) 400-400-40 MG/5ML suspension Take 5 mLs by mouth every 6 (six) hours as needed for indigestion.   [DISCONTINUED] calcium carbonate (TUMS) 500 MG chewable tablet Chew 1 tablet (200 mg of elemental calcium total) by mouth 3 (three) times daily as needed for indigestion or heartburn.   [DISCONTINUED] ondansetron (ZOFRAN-ODT) 4 MG disintegrating tablet Take 1 tablet (4 mg total) by mouth every 8 (eight) hours as needed for nausea or vomiting.   Current Facility-Administered Medications for the 01/12/23 encounter (Office Visit) with Trudi Ida, NP  Medication   cyanocobalamin ((VITAMIN B-12)) injection 1,000 mcg     Allergies:   Patient has no known allergies.   Social History   Socioeconomic History   Marital status: Divorced    Spouse name: Not on file   Number of children: Not on file   Years of education: Not on file   Highest education level: Not on file  Occupational History   Occupation: retired  Tobacco Use   Smoking status: Former    Packs/day: 0.25    Years: 20.00    Total pack years: 5.00    Types: Cigarettes    Quit date: 11/21/1984    Years since quitting: 38.1   Smokeless tobacco: Never  Vaping Use   Vaping Use: Never used  Substance and Sexual Activity   Alcohol use: Not Currently   Drug use: Not Currently   Sexual activity: Yes  Other Topics Concern   Not on file  Social History Narrative   Not on file   Social Determinants of Health   Financial Resource Strain: Low Risk  (01/05/2022)   Overall Financial Resource Strain (CARDIA)    Difficulty of Paying Living Expenses: Not hard at all  Food Insecurity: No Food Insecurity (01/05/2022)   Hunger Vital Sign    Worried About Running Out of Food in the Last  Year: Never true    [place] in the Last Year: Never true  Transportation Needs: No Transportation Needs (01/05/2022)   PRAPARE - Hydrologist (Medical): No    Lack of Transportation (Non-Medical): No  Physical Activity: Insufficiently Active (01/05/2022)   Exercise Vital Sign    Days of Exercise per Week: 3 days    Minutes of Exercise per Session: 20 min  Stress: No Stress Concern Present (01/05/2022)   [place]    Feeling of Stress : Not at all  Social Connections: Moderately Integrated (08/25/2021)  Social Licensed conveyancer [NHANES]    Frequency of Communication with Friends and Family: More than three times a week    Frequency of Social Gatherings with Friends and Family: More than three times a week    Attends Religious Services: More than 4 times per year    Active Member of [employer] or Organizations: Yes    Attends Archivist Meetings: 1 to 4 times per year    Marital Status: Divorced     Family History: The patient's family history includes Asthma in his mother; Cancer (age of onset: 86) in his sister; Diabetes in his brother; Heart attack (age of onset: 14) in his father; Heart disease in his father and maternal grandmother; Stroke in his father.  ROS:   Please see the history of present illness.    All other systems reviewed and are negative.  EKGs/Labs/Other Studies Reviewed:    The following studies were reviewed today:  06/07/2011 -myocardial perfusion study was negative for ischemia  EKG:  EKG is not ordered today.    Recent Labs: 03/24/2022: TSH 2.200 11/23/2022: BUN 17; Creatinine, Ser 1.45; Hemoglobin 15.0; Platelets 195; Potassium 4.2; Sodium 140 11/24/2022: ALT 18  Recent Lipid Panel    Component Value Date/Time   CHOL 123 03/28/2022 1222   TRIG 71 03/28/2022 1222   HDL 52 03/28/2022 1222   CHOLHDL 2.4 03/28/2022 1222   LDLCALC 56  03/28/2022 1222     Risk Assessment/Calculations:                Physical Exam:    VS:  BP 120/70   Pulse (!) 57   Ht 5' 10"$  (1.778 m)   Wt 161 lb 9.6 oz (73.3 kg)   SpO2 96%   BMI 23.19 kg/m     Wt Readings from Last 3 Encounters:  01/12/23 161 lb 9.6 oz (73.3 kg)  11/29/22 177 lb (80.3 kg)  11/23/22 162 lb (73.5 kg)     GEN:  Well nourished, well developed in no acute distress HEENT: Normal NECK: No JVD; No carotid bruits LYMPHATICS: No lymphadenopathy CARDIAC: RRR, no murmurs, rubs, gallops RESPIRATORY:  Clear to auscultation without rales, wheezing or rhonchi  ABDOMEN: Soft, non-tender, non-distended MUSCULOSKELETAL:  No edema; No deformity  SKIN: Warm and dry NEUROLOGIC:  Alert and oriented x 3 PSYCHIATRIC:  Normal affect   ASSESSMENT:    1. Coronary artery disease involving native coronary artery of native heart without angina pectoris   2. Essential hypertension   3. Pure hypercholesterolemia    PLAN:    In order of problems listed above:  Coronary artery disease -Recently evaluated in the ED for sharp chest pain, troponins were negative, EKG was reassuring, his pain subsided after a GI cocktail in the ED, he eventually d/c'd home with plans to f/u with cardiology. S/p CABG x 3 in 2005, Stable with no anginal symptoms. No indication for ischemic evaluation. Continue ASA, metoprolol, rosuvastatin.   Hypertension -blood pressure today well controlled at 120/70. Continue current antihypertensive medication regimen.   Hyperlipidemia -LDL on 03/28/2022 was 56, currently well-controlled, managed by his PCP, continue rosuvastatin           Medication Adjustments/Labs and Tests Ordered: Current medicines are reviewed at length with the patient today.  Concerns regarding medicines are outlined above.  Orders Placed This Encounter  Procedures   EKG 12-Lead   No orders of the defined types were placed in this encounter.   Patient Instructions   Medication  Instructions:  Your physician recommends that you continue on your current medications as directed. Please refer to the Current Medication list given to you today.  *If you need a refill on your cardiac medications before your next appointment, please call your pharmacy*  Follow-Up: At [facility], you and your health needs are our priority.  As part of our continuing mission to provide you with exceptional heart care, we have created designated Provider Care Teams.  These Care Teams include your primary Cardiologist (physician) and Advanced Practice Providers (APPs -  Physician Assistants and Nurse Practitioners) who all work together to provide you with the care you need, when you need it.  We recommend signing up for the patient portal called "MyChart".  Sign up information is provided on this After Visit Summary.  MyChart is used to connect with patients for Virtual Visits (Telemedicine).  Patients are able to view lab/test results, encounter notes, upcoming appointments, etc.  Non-urgent messages can be sent to your provider as well.   To learn more about what you can do with MyChart, go to [email or web address].    Your next appointment:   6 month(s)  Provider:   Skeet Latch, MD       Signed, Trudi Ida, NP  01/12/2023 4:18 PM    [place]

## 2023-01-12 NOTE — Patient Instructions (Signed)
Medication Instructions:  Your physician recommends that you continue on your current medications as directed. Please refer to the Current Medication list given to you today.  *If you need a refill on your cardiac medications before your next appointment, please call your pharmacy*  Follow-Up: At [facility], you and your health needs are our priority.  As part of our continuing mission to provide you with exceptional heart care, we have created designated Provider Care Teams.  These Care Teams include your primary Cardiologist (physician) and Advanced Practice Providers (APPs -  Physician Assistants and Nurse Practitioners) who all work together to provide you with the care you need, when you need it.  We recommend signing up for the patient portal called "MyChart".  Sign up information is provided on this After Visit Summary.  MyChart is used to connect with patients for Virtual Visits (Telemedicine).  Patients are able to view lab/test results, encounter notes, upcoming appointments, etc.  Non-urgent messages can be sent to your provider as well.   To learn more about what you can do with MyChart, go to [email or web address].    Your next appointment:   6 month(s)  Provider:   Skeet Latch, MD

## 2023-01-25 ENCOUNTER — Ambulatory Visit (INDEPENDENT_AMBULATORY_CARE_PROVIDER_SITE_OTHER): Payer: Medicare PPO

## 2023-01-25 ENCOUNTER — Ambulatory Visit: Payer: Medicare PPO | Admitting: Internal Medicine

## 2023-01-25 ENCOUNTER — Encounter: Payer: Self-pay | Admitting: Internal Medicine

## 2023-01-25 VITALS — BP 106/60 | HR 58 | Temp 97.4°F | Ht 67.0 in | Wt 165.0 lb

## 2023-01-25 VITALS — BP 106/60 | HR 58 | Temp 97.4°F | Ht 67.0 in | Wt 165.6 lb

## 2023-01-25 DIAGNOSIS — I1310 Hypertensive heart and chronic kidney disease without heart failure, with stage 1 through stage 4 chronic kidney disease, or unspecified chronic kidney disease: Secondary | ICD-10-CM | POA: Diagnosis not present

## 2023-01-25 DIAGNOSIS — E1122 Type 2 diabetes mellitus with diabetic chronic kidney disease: Secondary | ICD-10-CM | POA: Diagnosis not present

## 2023-01-25 DIAGNOSIS — N1831 Chronic kidney disease, stage 3a: Secondary | ICD-10-CM | POA: Diagnosis not present

## 2023-01-25 DIAGNOSIS — Z0000 Encounter for general adult medical examination without abnormal findings: Secondary | ICD-10-CM | POA: Diagnosis not present

## 2023-01-25 DIAGNOSIS — I25118 Atherosclerotic heart disease of native coronary artery with other forms of angina pectoris: Secondary | ICD-10-CM | POA: Diagnosis not present

## 2023-01-25 DIAGNOSIS — K219 Gastro-esophageal reflux disease without esophagitis: Secondary | ICD-10-CM | POA: Diagnosis not present

## 2023-01-25 DIAGNOSIS — R2232 Localized swelling, mass and lump, left upper limb: Secondary | ICD-10-CM

## 2023-01-25 MED ORDER — FAMOTIDINE 20 MG PO TABS: ORAL_TABLET | ORAL | 1 refills | Status: DC

## 2023-01-25 NOTE — Patient Instructions (Signed)

## 2023-01-25 NOTE — Progress Notes (Unsigned)
Barnet Glasgow Martin,acting as a Education administrator for [name], MD.,have documented all relevant documentation on the behalf of [name], MD,as directed by  [name], MD while in the presence of [name], MD.    Subjective:     Patient ID: Jose Ponce , male    DOB: 1941/07/20 , 82 y.o.   MRN: [id number]   Chief Complaint  Patient presents with   Diabetes   Hypertension    HPI  Pt is here today for diabetes, and blood pressure check. He reports compliance with meds. He denies headaches, chest pain and shortness of breath. Patient has 2 small round bumps on the inside of left hand. Patient states one showed up about 2 weeks ago and the other about 2 days ago. Patient states he also has post nasal drip when waking up. Patient is no longer having any reflux.   He was also seen by [facility] for his AWV today.   BP Readings from Last 3 Encounters: 01/25/23 : 106/60 01/25/23 : 106/60 01/12/23 : 120/70    Diabetes He presents for his follow-up diabetic visit. He has type 2 diabetes mellitus. His disease course has been stable. There are no hypoglycemic associated symptoms. Pertinent negatives for hypoglycemia include no dizziness or headaches. Pertinent negatives for diabetes include no blurred vision, no fatigue, no polydipsia, no polyphagia and no polyuria. There are no hypoglycemic complications. Diabetic complications include nephropathy. Risk factors for coronary artery disease include diabetes mellitus, dyslipidemia, hypertension and male sex. He is following a diabetic diet. He participates in exercise three times a week. An ACE inhibitor/angiotensin II receptor blocker is being taken.  Hypertension This is a chronic problem. The current episode started more than 1 year ago. The problem has been gradually improving since onset. Pertinent negatives include no blurred vision or headaches. The current treatment provides moderate improvement. Compliance problems include  exercise.  Hypertensive end-organ damage includes kidney disease and CAD/MI.     Past Medical History:  Diagnosis Date   Chronic kidney disease    Coronary artery disease    Gout    Hyperlipidemia    Hypertension    Prostate cancer ([place])    Type 2 diabetes mellitus ([place])      Family History  Problem Relation Age of Onset   Asthma Mother    Heart disease Father    Stroke Father    Heart attack Father 10   Cancer Sister 26   Diabetes Brother    Heart disease Maternal Grandmother      Current Outpatient Medications:    allopurinol (ZYLOPRIM) 100 MG tablet, TAKE 1/2 TABLET BY MOUTH EVERY DAY, Disp: 45 tablet, Rfl: 1   aspirin 81 MG tablet, Take 81 mg by mouth daily., Disp: , Rfl:    Cholecalciferol 125 MCG (5000 UT) CHEW, Chew 5,000 Units by mouth. , Disp: , Rfl:    dapagliflozin propanediol (FARXIGA) 5 MG TABS tablet, Take by mouth daily., Disp: , Rfl:    Diclofenac Sodium 3 % GEL, Place onto the skin., Disp: , Rfl:    famotidine (PEPCID) 20 MG tablet, TAKE 1 TABLET BY MOUTH twice daily, Disp: 180 tablet, Rfl: 1   Flaxseed, Linseed, (FLAX SEEDS PO), Take 1 tablet by mouth daily., Disp: , Rfl:    ketoconazole (NIZORAL) 2 % cream, Apply 1 application topically daily., Disp: , Rfl:    lidocaine (LIDODERM) 5 %, Place 1 patch onto the skin daily. Remove & Discard patch within  12 hours or as directed by MD Place over left lower back., Disp: 30 patch, Rfl: 0   linaclotide (LINZESS) 145 MCG CAPS capsule, Take 145 mcg by mouth daily before breakfast., Disp: , Rfl:    metoprolol succinate (TOPROL-XL) 50 MG 24 hr tablet, TAKE 1 TABLET BY MOUTH EVERY DAY, Disp: 90 tablet, Rfl: 1   rosuvastatin (CRESTOR) 10 MG tablet, TAKE 1 TABLET BY MOUTH EVERY DAY, Disp: 90 tablet, Rfl: 1   Semaglutide,0.25 or 0.'5MG'$ /DOS, (OZEMPIC, 0.25 OR 0.5 MG/DOSE,) 2 MG/3ML SOPN, Inject 0.25 mg into the skin once a week., Disp: 6 mL, Rfl: 1   sildenafil (VIAGRA) 50 MG tablet, Take 1 tablet (50 mg total) by mouth daily  as needed for erectile dysfunction., Disp: 30 tablet, Rfl: 0   telmisartan (MICARDIS) 20 MG tablet, TAKE 1 TABLET BY MOUTH EVERY DAY, Disp: 90 tablet, Rfl: 1   triamcinolone cream (KENALOG) 0.1 %, APPLY TO AFFECTED AREA on hand TWICE DAILY AS NEEDED, Disp: 45 g, Rfl: 0  Current Facility-Administered Medications:    cyanocobalamin ((VITAMIN B-12)) injection 1,000 mcg, 1,000 mcg, Intramuscular, Once, [place] Chard, MD   No Known Allergies   Review of Systems  Constitutional: Negative.  Negative for fatigue.  Eyes:  Negative for blurred vision.  Respiratory: Negative.    Cardiovascular: Negative.   Gastrointestinal: Negative.   Endocrine: Negative.  Negative for polydipsia, polyphagia and polyuria.  Musculoskeletal: Negative.   Skin: Negative.         Patient has 2 small round bumps on the inside of left hand. Patient states one showed up about 2 weeks ago and the other about 2 days ago.   Allergic/Immunologic: Negative.   Neurological:  Negative for dizziness and headaches.  Hematological: Negative.      Today's Vitals   01/25/23 1507  BP: 106/60  Pulse: (!) 58  Temp: (!) 97.4 F (36.3 C)  TempSrc: Oral  Weight: 165 lb (74.8 kg)  Height: '5\' 7"'$  (1.702 m)  PainSc: 0-No pain   Body mass index is 25.84 kg/m.  Wt Readings from Last 3 Encounters:  01/25/23 165 lb (74.8 kg)  01/25/23 165 lb 9.6 oz (75.1 kg)  01/12/23 161 lb 9.6 oz (73.3 kg)    Objective:  Physical Exam Vitals and nursing note reviewed.  Constitutional:      Appearance: Normal appearance.  HENT:     Head: Normocephalic and atraumatic.     Nose:     Comments: Masked     Mouth/Throat:     Comments: Masked  Eyes:     Extraocular Movements: Extraocular movements intact.  Cardiovascular:     Rate and Rhythm: Normal rate and regular rhythm.     Heart sounds: Normal heart sounds.  Pulmonary:     Effort: Pulmonary effort is normal.     Breath sounds: Normal breath sounds.  Musculoskeletal:     Cervical  back: Normal range of motion.  Skin:    General: Skin is warm.  Neurological:     General: No focal deficit present.     Mental Status: He is alert.  Psychiatric:        Mood and Affect: Mood normal.         Assessment And Plan:     1. Type 2 diabetes mellitus with stage 3a chronic kidney disease, without long-term current use of insulin (HCC) - Hemoglobin A1c  2. Benign hypertensive heart and renal disease - BMP8+eGFR  3. Coronary artery disease involving native coronary artery of  native heart with other form of angina pectoris ([place])  4. Stage 3a chronic kidney disease ([place])  5. Gastroesophageal reflux disease without esophagitis   Patient was given opportunity to ask questions. Patient verbalized understanding of the plan and was able to repeat key elements of the plan. All questions were answered to their satisfaction.   I, [name], MD, have reviewed all documentation for this visit. The documentation on 01/25/23 for the exam, diagnosis, procedures, and orders are all accurate and complete.   IF YOU HAVE BEEN REFERRED TO A SPECIALIST, IT MAY TAKE 1-2 WEEKS TO SCHEDULE/PROCESS THE REFERRAL. IF YOU HAVE NOT HEARD FROM US/SPECIALIST IN TWO WEEKS, PLEASE GIVE [place] A CALL AT [phone].   THE PATIENT IS ENCOURAGED TO PRACTICE SOCIAL DISTANCING DUE TO THE COVID-19 PANDEMIC.

## 2023-01-25 NOTE — Progress Notes (Signed)
Subjective:   Jose Ponce is a 82 y.o. male who presents for Medicare Annual/Subsequent preventive examination.  Review of Systems     Cardiac Risk Factors include: advanced age (>46mn, >>72women);diabetes mellitus;dyslipidemia;hypertension;male gender     Objective:    Today's Vitals   01/25/23 1441  BP: 106/60  Pulse: (!) 58  Temp: (!) 97.4 F (36.3 C)  TempSrc: Oral  SpO2: 96%  Weight: 165 lb 9.6 oz (75.1 kg)  Height: '5\' 7"'$  (1.702 m)   Body mass index is 25.94 kg/m.     01/25/2023    2:53 PM 11/23/2022    5:40 PM 01/05/2022    4:01 PM 01/06/2021    3:27 PM 12/04/2019    2:58 PM 06/18/2019    2:39 PM 11/08/2018   11:48 AM  Advanced Directives  Does Patient Have a Medical Advance Directive? Yes No No No Yes Yes Yes  Type of [place]amed[place]of [place] will    [place] will Living will  Does patient want to make changes to medical advance directive?      No - Patient declined   Copy of [place]? No - copy requested    No - copy requested No - copy requested   Would patient like information on creating a medical advance directive?   No - Patient declined No - Patient declined       Current Medications (verified) Outpatient Encounter Medications as of 01/25/2023  Medication Sig   allopurinol (ZYLOPRIM) 100 MG tablet TAKE 1/2 TABLET BY MOUTH EVERY DAY   aspirin 81 MG tablet Take 81 mg by mouth daily.   Cholecalciferol 125 MCG (5000 UT) CHEW Chew 5,000 Units by mouth.    dapagliflozin propanediol (FARXIGA) 5 MG TABS tablet Take by mouth daily.   Diclofenac Sodium 3 % GEL Place onto the skin.   famotidine (PEPCID) 20 MG tablet TAKE 1 TABLET BY MOUTH EVERY DAY   Flaxseed, Linseed, (FLAX SEEDS PO) Take 1 tablet by mouth daily.   ketoconazole (NIZORAL) 2 % cream Apply 1 application topically daily.   lidocaine (LIDODERM) 5 % Place 1 patch onto the skin  daily. Remove & Discard patch within 12 hours or as directed by MD Place over left lower back.   linaclotide (LINZESS) 145 MCG CAPS capsule Take 145 mcg by mouth daily before breakfast.   metoprolol succinate (TOPROL-XL) 50 MG 24 hr tablet TAKE 1 TABLET BY MOUTH EVERY DAY   rosuvastatin (CRESTOR) 10 MG tablet TAKE 1 TABLET BY MOUTH EVERY DAY   Semaglutide,0.25 or 0.'5MG'$ /DOS, (OZEMPIC, 0.25 OR 0.5 MG/DOSE,) 2 MG/3ML SOPN Inject 0.25 mg into the skin once a week.   sildenafil (VIAGRA) 50 MG tablet Take 1 tablet (50 mg total) by mouth daily as needed for erectile dysfunction.   telmisartan (MICARDIS) 20 MG tablet TAKE 1 TABLET BY MOUTH EVERY DAY   triamcinolone cream (KENALOG) 0.1 % APPLY TO AFFECTED AREA on hand TWICE DAILY AS NEEDED   Facility-Administered Encounter Medications as of 01/25/2023  Medication   cyanocobalamin ((VITAMIN B-12)) injection 1,000 mcg    Allergies (verified) Patient has no known allergies.   History: Past Medical History:  Diagnosis Date   Chronic kidney disease    Coronary artery disease    Gout    Hyperlipidemia    Hypertension    Prostate cancer ([place]    [place] 2 diabetes mellitus [place]    Past  Surgical History:  Procedure Laterality Date   CARDIAC CATHETERIZATION  01/16/2004   multi-vessel coronary disease 40-50% Left main, 100% mid LAD75-80% ostial stenosis   CARDIOVASCULAR STRESS TEST  08/09/2004   post CABG, low risk scan   CARDIOVASCULAR STRESS TEST  10/23/2003   mild septal ischemia, EF43%,    CORONARY ARTERY BYPASS GRAFT  02/05/2004   CABGx3 Dr.Bartle   Lower Ext. Doppler  10/20/2011   Right Anterior Tibial: occlusive disease, Left Anterior Tibial occluded w/reconstitution at ankle, mildly abnormal LEA   NM MYOCAR PERF WALL MOTION  06/07/2011   protocol:Persantine, EF60%, negative for ischemia, low risk scan   NM MYOCAR PERF WALL MOTION  08/05/2009   protocol: Persantine, EF57%. no evidence of ischemia, low risk scan.    TRANSPERINEAL IMPLANT  OF RADIATION SEEDS W/ ULTRASOUND     Family History  Problem Relation Age of Onset   Asthma Mother    Heart disease Father    Stroke Father    Heart attack Father 69   Cancer Sister 72   Diabetes Brother    Heart disease Maternal Grandmother    Social History   Socioeconomic History   Marital status: Divorced    Spouse name: Not on file   Number of children: Not on file   Years of education: Not on file   Highest education level: Not on file  Occupational History   Occupation: retired  Tobacco Use   Smoking status: Former    Packs/day: 0.25    Years: 20.00    Total pack years: 5.00    Types: Cigarettes    Quit date: 11/21/1984    Years since quitting: 38.2   Smokeless tobacco: Never  Vaping Use   Vaping Use: Never used  Substance and Sexual Activity   Alcohol use: Not Currently   Drug use: Not Currently   Sexual activity: Yes  Other Topics Concern   Not on file  Social History Narrative   Not on file   Social Determinants of Health   Financial Resource Strain: Low Risk  (01/25/2023)   Overall Financial Resource Strain (CARDIA)    Difficulty of Paying Living Expenses: Not hard at all  Food Insecurity: No Food Insecurity (01/25/2023)   Hunger Vital Sign    Worried About Running Out of Food in the Last Year: Never true    [place] in the Last Year: Never true  Transportation Needs: No Transportation Needs (01/25/2023)   PRAPARE - Hydrologist (Medical): No    Lack of Transportation (Non-Medical): No  Physical Activity: Sufficiently Active (01/25/2023)   Exercise Vital Sign    Days of Exercise per Week: 7 days    Minutes of Exercise per Session: 60 min  Stress: No Stress Concern Present (01/25/2023)   [place]    Feeling of Stress : Only a little  Social Connections: Socially Integrated (01/25/2023)   Social Connection and Isolation Panel [NHANES]    Frequency of  Communication with Friends and Family: More than three times a week    Frequency of Social Gatherings with Friends and Family: More than three times a week    Attends Religious Services: More than 4 times per year    Active Member of [employer] or Organizations: Yes    Attends Music therapist: More than 4 times per year    Marital Status: Married    Tobacco Counseling Counseling given: Not Answered  Clinical Intake:  Pre-visit preparation completed: Yes  Pain : No/denies pain     Nutritional Status: BMI 25 -29 Overweight Nutritional Risks: None Diabetes: Yes  How often do you need to have someone help you when you read instructions, pamphlets, or other written materials from your doctor or pharmacy?: 1 - Never  Diabetic? Yes Nutrition Risk Assessment:  Has the patient had any N/V/D within the last 2 months?  No  Does the patient have any non-healing wounds?  No  Has the patient had any unintentional weight loss or weight gain?  No   Diabetes:  Is the patient diabetic?  Yes  If diabetic, was a CBG obtained today?  No  Did the patient bring in their glucometer from home?  No  How often do you monitor your CBG's? daily.   Financial Strains and Diabetes Management:  Are you having any financial strains with the device, your supplies or your medication? No .  Does the patient want to be seen by Chronic Care Management for management of their diabetes?  No  Would the patient like to be referred to a Nutritionist or for Diabetic Management?  No   Diabetic Exams:  Diabetic Eye Exam: Overdue for diabetic eye exam. Pt has been advised about the importance in completing this exam. Patient advised to call and schedule an eye exam. Diabetic Foot Exam: Completed 03/24/2022   Interpreter Needed?: No  Information entered by :: NAllen LPN   Activities of Daily Living    01/25/2023    2:55 PM  In your present state of health, do you have any difficulty performing the  following activities:  Hearing? 0  Vision? 0  Difficulty concentrating or making decisions? 0  Walking or climbing stairs? 0  Dressing or bathing? 0  Doing errands, shopping? 0  Preparing Food and eating ? N  Using the Toilet? N  In the past six months, have you accidently leaked urine? N  Do you have problems with loss of bowel control? N  Managing your Medications? N  Managing your Finances? N  Housekeeping or managing your Housekeeping? N    Patient Care Team: Glendale Chard, MD as PCP - General (Internal Medicine) Skeet Latch, MD as PCP - Cardiology (Cardiology) Mayford Knife, [facility] as Pharmacist (Pharmacist)  Indicate any recent Medical Services you may have received from other than [facility] providers in the past year (date may be approximate).     Assessment:   This is a routine wellness examination for Day[place].  Hearing/Vision screen Vision Screening - Comments:: Regular eye exams, Dr. Venetia Maxon  Dietary issues and exercise activities discussed: Current Exercise Habits: Home exercise routine, Type of exercise: walking, Time (Minutes): 60, Frequency (Times/Week): 7, Weekly Exercise (Minutes/Week): 420   Goals Addressed             This Visit's Progress    Patient Stated       01/25/2023, wants to see what is wrong with hand       Depression Screen    01/25/2023    2:55 PM 01/05/2022    4:03 PM 01/06/2021    3:28 PM 01/06/2021    2:40 PM 12/04/2019    2:59 PM 06/18/2019    2:41 PM 03/14/2019   12:10 PM  PHQ 2/9 Scores  PHQ - 2 Score 0 0 0 0 0 0 0  PHQ- 9 Score     0 0     Fall Risk    01/25/2023  2:55 PM 01/05/2022    4:02 PM 01/06/2021    3:28 PM 01/06/2021    2:40 PM 12/04/2019    2:58 PM  Fall Risk   Falls in the past year? 0 0 0 0 0  Number falls in past yr: 0      Injury with Fall? 0      Risk for fall due to : Medication side effect Medication side effect Medication side effect  Medication side effect  Follow up Falls prevention  discussed;Education provided;Falls evaluation completed Falls evaluation completed;Education provided;Falls prevention discussed Falls evaluation completed;Education provided;Falls prevention discussed  Falls evaluation completed;Education provided;Falls prevention discussed    FALL RISK PREVENTION PERTAINING TO THE HOME:  Any stairs in or around the home? Yes  If so, are there any without handrails? No  Home free of loose throw rugs in walkways, pet beds, electrical cords, etc? Yes  Adequate lighting in your home to reduce risk of falls? Yes   ASSISTIVE DEVICES UTILIZED TO PREVENT FALLS:  Life alert? No  Use of a cane, walker or w/c? No  Grab bars in the bathroom? No  Shower chair or bench in shower? No  Elevated toilet seat or a handicapped toilet? Yes   TIMED UP AND GO:  Was the test performed? Yes .  Length of time to ambulate 10 feet: 5 sec.   Gait steady and fast without use of assistive device  Cognitive Function:        01/25/2023    2:55 PM 08/04/2022    2:32 PM 01/05/2022    4:04 PM 08/25/2021    4:14 PM 01/06/2021    3:30 PM  6CIT Screen  What Year? 0 points  0 points 0 points 0 points  What month? 0 points 0 points 0 points 0 points 0 points  What time? 0 points 0 points 0 points 0 points 0 points  Count back from 20 0 points 0 points 0 points 0 points 0 points  Months in reverse 0 points 0 points 0 points 2 points 0 points  Repeat phrase 8 points 4 points 4 points 8 points 0 points  Total Score 8 points  4 points 10 points 0 points    Immunizations Immunization History  Administered Date(s) Administered   Fluad Quad(high Dose 65+) 08/26/2020, 08/02/2022   Influenza, High Dose Seasonal PF 07/22/2017   Influenza,inj,Quad PF,6+ Mos 08/02/2019   Influenza-Unspecified 08/21/2018, 08/03/2021   PFIZER(Purple Top)SARS-COV-2 Vaccination 12/10/2019, 12/30/2019, 08/17/2020, 03/01/2021, 08/03/2021   Pneumococcal Conjugate-13 09/11/2019   Pneumococcal Polysaccharide-23  11/04/2009, 10/04/2016, 12/22/2017   Tdap 03/24/2022   Unspecified SARS-COV-2 Vaccination 08/17/2022   Zoster Recombinat (Shingrix) 07/22/2017, 12/22/2017    TDAP status: Up to date  Flu Vaccine status: Up to date  Pneumococcal vaccine status: Up to date  Covid-19 vaccine status: Completed vaccines  Qualifies for Shingles Vaccine? Yes   Zostavax completed Yes   Shingrix Completed?: Yes  Screening Tests Health Maintenance  Topic Date Due   OPHTHALMOLOGY EXAM  01/21/2022   COVID-19 Vaccine (7 - 2023-24 season) 10/12/2022   Medicare Annual Wellness (AWV)  01/05/2023   Diabetic kidney evaluation - Urine ACR  03/25/2023   FOOT EXAM  03/25/2023   HEMOGLOBIN A1C  05/12/2023   Diabetic kidney evaluation - eGFR measurement  11/24/2023   DTaP/Tdap/Td (2 - Td or Tdap) 03/24/2032   Pneumonia Vaccine 18+ Years old  Completed   INFLUENZA VACCINE  Completed   Zoster Vaccines- Shingrix  Completed   HPV VACCINES  Aged Out    Health Maintenance  Health Maintenance Due  Topic Date Due   OPHTHALMOLOGY EXAM  01/21/2022   COVID-19 Vaccine (7 - 2023-24 season) 10/12/2022   Medicare Annual Wellness (AWV)  01/05/2023    Colorectal cancer screening: No longer required.   Lung Cancer Screening: (Low Dose CT Chest recommended if Age 61-80 years, 30 pack-year currently smoking OR have quit w/in 15years.) does not qualify.   Lung Cancer Screening Referral: no  Additional Screening:  Hepatitis C Screening: does not qualify;   Vision Screening: Recommended annual ophthalmology exams for early detection of glaucoma and other disorders of the eye. Is the patient up to date with their annual eye exam?  No  Who is the provider or what is the name of the office in which the patient attends annual eye exams? Dr. Venetia Maxon If pt is not established with a provider, would they like to be referred to a provider to establish care? No .   Dental Screening: Recommended annual dental exams for proper  oral hygiene  Community Resource Referral / Chronic Care Management: CRR required this visit?  No   CCM required this visit?  No      Plan:     I have personally reviewed and noted the following in the patient's chart:   Medical and social history Use of alcohol, tobacco or illicit drugs  Current medications and supplements including opioid prescriptions. Patient is not currently taking opioid prescriptions. Functional ability and status Nutritional status Physical activity Advanced directives List of other physicians Hospitalizations, surgeries, and ER visits in previous 12 months Vitals Screenings to include cognitive, depression, and falls Referrals and appointments  In addition, I have reviewed and discussed with patient certain preventive protocols, quality metrics, and best practice recommendations. A written personalized care plan for preventive services as well as general preventive health recommendations were provided to patient.     Kellie Simmering, LPN   [id number]   Nurse Notes: none

## 2023-01-25 NOTE — Patient Instructions (Signed)
Jose Ponce , Thank you for taking time to come for your Medicare Wellness Visit. I appreciate your ongoing commitment to your health goals. Please review the following plan we discussed and let me know if I can assist you in the future.   These are the goals we discussed:  Goals       DIET - INCREASE WATER INTAKE (pt-stated)      Exercise 150 min/wk Moderate Activity (pt-stated)      Exercise 150 min/wk Moderate Activity      12/04/2019, wants to exercise more, decrease sweet intake, drink more water      Patient Stated      01/06/2021, wants to exercise more      Patient Stated      01/05/2022, no goals      Patient Stated      01/25/2023, wants to see what is wrong with hand      [place] (see longitudinal plan of care for additional care plan information)  Current Barriers:  Chronic Disease Management support, education, and care coordination needs related to Hypertension, Hyperlipidemia, Diabetes, and Gout   Hypertension BP Readings from Last 3 Encounters:  11/05/20 132/64  10/07/20 136/84  07/06/20 112/66  Pharmacist Clinical Goal(s): Over the next 90 days, patient will work with PharmD and providers to maintain BP goal <140/90 Current regimen:  Telmisartan 20 MG - Take 1 tablet daily Metoprolol Succinate 50 MG- Take 1 tablet daily  Interventions: Dietary and exercise recommendations provided Limit salt intake Increase physical activity to 30 minutes 3 times weekly, with a long term goal of 30 minutes 5 times weekly Patient self care activities - Over the next 30 days, patient will: Check BP once a week, document, and provide at future appointments Ensure daily salt intake < 2300 mg/day  Hyperlipidemia Lab Results  Component Value Date/Time   LDLCALC 66 12/04/2019 05:00 PM  Pharmacist Clinical Goal(s): Over the next 90 days, patient will work with PharmD and providers to maintain LDL goal < 70 Current regimen:  Crestor 10 MG - Take  daily ASA 81 MG- Take daily Interventions: Dietary and exercise recommendations provided Increase intake of healthy fats (i.e avocados, walnuts, flaxseed) Decrease intake of foods high in saturated and trans fat (i.e. chips)  Patient self care activities - Over the next 90 days, patient will: Work to improve diet, increase heart healthy fats and decrease trans/saturated fats Try to exercise for 30 minutes daily 5 times per week  Diabetes Lab Results  Component Value Date/Time   HGBA1C 8.8 (H) 10/07/2020 03:52 PM   HGBA1C 7.4 (H) 07/06/2020 04:53 PM  Pharmacist Clinical Goal(s): Over the next 90 days, patient will work with PharmD and providers to achieve A1c goal <8% Current regimen:  Farxiga 5 MG - take 1 tablet by mouth daily Ozempic 0.5 MG - inject weekly into the skin on Wednesday  Interventions: Advised patient to continue checking blood sugar daily and record Provided dietary and exercise recommendations Discussed the importance of diet and exercise to reach hemoglobin A1c goal of less than 7% Patient self care activities - Over the next 90 days, patient will: Check blood sugar once daily, document, and provide at future appointments Contact provider with any episodes of hypoglycemia  GOUT Pharmacist Clinical Goal(s) Over the next 90 days, patient will work with PharmD and providers to reduce flare ups Current regimen:  Allopurinol 100 MG - take 1/2 tablet by mouth everyday  Interventions: Will avoid foods that may cause gout symptoms.  Patient self care activities - Over the next 90 days, patient will: Limit gout flare ups  Medication management Pharmacist Clinical Goal(s): Over the next 90 days, patient will work with PharmD and providers to maintain optimal medication adherence Current pharmacy: [facility] Interventions Comprehensive medication review performed. Continue current medication management strategy Patient self care activities - Over the next 90 days,  patient will: Focus on medication adherence by taking medication everyday on a routine basis Take medications as prescribed Report any questions or concerns to PharmD and/or provider(s)  Initial goal documentation         This is a list of the screening recommended for you and due dates:  Health Maintenance  Topic Date Due   Eye exam for diabetics  01/21/2022   COVID-19 Vaccine (7 - 2023-24 season) 10/12/2022   Yearly kidney health urinalysis for diabetes  03/25/2023   Complete foot exam   03/25/2023   Hemoglobin A1C  05/12/2023   Yearly kidney function blood test for diabetes  11/24/2023   Medicare Annual Wellness Visit  01/25/2024   DTaP/Tdap/Td vaccine (2 - Td or Tdap) 03/24/2032   Pneumonia Vaccine  Completed   Flu Shot  Completed   Zoster (Shingles) Vaccine  Completed   HPV Vaccine  Aged Out    Advanced directives: Please bring a copy of your POA (Power of [place]) and/or Living Will to your next appointment.   Conditions/risks identified: none  Next appointment: Follow up in one year for your annual wellness visit.   Preventive Care 82 Years and Older, Male  Preventive care refers to lifestyle choices and visits with your health care provider that can promote health and wellness. What does preventive care include? A yearly physical exam. This is also called an annual well check. Dental exams once or twice a year. Routine eye exams. Ask your health care provider how often you should have your eyes checked. Personal lifestyle choices, including: Daily care of your teeth and gums. Regular physical activity. Eating a healthy diet. Avoiding tobacco and drug use. Limiting alcohol use. Practicing safe sex. Taking low doses of aspirin every day. Taking vitamin and mineral supplements as recommended by your health care provider. What happens during an annual well check? The services and screenings done by your health care provider during your annual well check will  depend on your age, overall health, lifestyle risk factors, and family history of disease. Counseling  Your health care provider may ask you questions about your: Alcohol use. Tobacco use. Drug use. Emotional well-being. Home and relationship well-being. Sexual activity. Eating habits. History of falls. Memory and ability to understand (cognition). Work and work Statistician. Screening  You may have the following tests or measurements: Height, weight, and BMI. Blood pressure. Lipid and cholesterol levels. These may be checked every 5 years, or more frequently if you are over 84 years old. Skin check. Lung cancer screening. You may have this screening every year starting at age 60 if you have a 30-pack-year history of smoking and currently smoke or have quit within the past 15 years. Fecal occult blood test (FOBT) of the stool. You may have this test every year starting at age 30. Flexible sigmoidoscopy or colonoscopy. You may have a sigmoidoscopy every 5 years or a colonoscopy every 10 years starting at age 90. Prostate cancer screening. Recommendations will vary depending on your family history and other risks. Hepatitis C blood test. Hepatitis B blood test. Sexually  transmitted disease (STD) testing. Diabetes screening. This is done by checking your blood sugar (glucose) after you have not eaten for a while (fasting). You may have this done every 1-3 years. Abdominal aortic aneurysm (AAA) screening. You may need this if you are a current or former smoker. Osteoporosis. You may be screened starting at age 67 if you are at high risk. Talk with your health care provider about your test results, treatment options, and if necessary, the need for more tests. Vaccines  Your health care provider may recommend certain vaccines, such as: Influenza vaccine. This is recommended every year. Tetanus, diphtheria, and acellular pertussis (Tdap, Td) vaccine. You may need a Td booster every 10  years. Zoster vaccine. You may need this after age 39. Pneumococcal 13-valent conjugate (PCV13) vaccine. One dose is recommended after age 35. Pneumococcal polysaccharide (PPSV23) vaccine. One dose is recommended after age 43. Talk to your health care provider about which screenings and vaccines you need and how often you need them. This information is not intended to replace advice given to you by your health care provider. Make sure you discuss any questions you have with your health care provider. Document Released: 12/04/2015 Document Revised: 07/27/2016 Document Reviewed: 09/08/2015 Elsevier Interactive Patient Education  2017 [place] Prevention in the Home Falls can cause injuries. They can happen to people of all ages. There are many things you can do to make your home safe and to help prevent falls. What can I do on the outside of my home? Regularly fix the edges of walkways and driveways and fix any cracks. Remove anything that might make you trip as you walk through a door, such as a raised step or threshold. Trim any bushes or trees on the path to your home. Use bright outdoor lighting. Clear any walking paths of anything that might make someone trip, such as rocks or tools. Regularly check to see if handrails are loose or broken. Make sure that both sides of any steps have handrails. Any raised decks and porches should have guardrails on the edges. Have any leaves, snow, or ice cleared regularly. Use sand or salt on walking paths during winter. Clean up any spills in your garage right away. This includes oil or grease spills. What can I do in the bathroom? Use night lights. Install grab bars by the toilet and in the tub and shower. Do not use towel bars as grab bars. Use non-skid mats or decals in the tub or shower. If you need to sit down in the shower, use a plastic, non-slip stool. Keep the floor dry. Clean up any water that spills on the floor as soon as it  happens. Remove soap buildup in the tub or shower regularly. Attach bath mats securely with double-sided non-slip rug tape. Do not have throw rugs and other things on the floor that can make you trip. What can I do in the bedroom? Use night lights. Make sure that you have a light by your bed that is easy to reach. Do not use any sheets or blankets that are too big for your bed. They should not hang down onto the floor. Have a firm chair that has side arms. You can use this for support while you get dressed. Do not have throw rugs and other things on the floor that can make you trip. What can I do in the kitchen? Clean up any spills right away. Avoid walking on wet floors. Keep items that you use  a lot in easy-to-reach places. If you need to reach something above you, use a strong step stool that has a grab bar. Keep electrical cords out of the way. Do not use floor polish or wax that makes floors slippery. If you must use wax, use non-skid floor wax. Do not have throw rugs and other things on the floor that can make you trip. What can I do with my stairs? Do not leave any items on the stairs. Make sure that there are handrails on both sides of the stairs and use them. Fix handrails that are broken or loose. Make sure that handrails are as long as the stairways. Check any carpeting to make sure that it is firmly attached to the stairs. Fix any carpet that is loose or worn. Avoid having throw rugs at the top or bottom of the stairs. If you do have throw rugs, attach them to the floor with carpet tape. Make sure that you have a light switch at the top of the stairs and the bottom of the stairs. If you do not have them, ask someone to add them for you. What else can I do to help prevent falls? Wear shoes that: Do not have high heels. Have rubber bottoms. Are comfortable and fit you well. Are closed at the toe. Do not wear sandals. If you use a stepladder: Make sure that it is fully opened.  Do not climb a closed stepladder. Make sure that both sides of the stepladder are locked into place. Ask someone to hold it for you, if possible. Clearly mark and make sure that you can see: Any grab bars or handrails. First and last steps. Where the edge of each step is. Use tools that help you move around (mobility aids) if they are needed. These include: Canes. Walkers. Scooters. Crutches. Turn on the lights when you go into a dark area. Replace any light bulbs as soon as they burn out. Set up your furniture so you have a clear path. Avoid moving your furniture around. If any of your floors are uneven, fix them. If there are any pets around you, be aware of where they are. Review your medicines with your doctor. Some medicines can make you feel dizzy. This can increase your chance of falling. Ask your doctor what other things that you can do to help prevent falls. This information is not intended to replace advice given to you by your health care provider. Make sure you discuss any questions you have with your health care provider. Document Released: 09/03/2009 Document Revised: 04/14/2016 Document Reviewed: 12/12/2014 Elsevier Interactive Patient Education  2017 [employer].

## 2023-01-26 LAB — BMP8+EGFR
BUN/Creatinine Ratio: 8 — ABNORMAL LOW (ref 10–24)
BUN: 12 mg/dL (ref 8–27)
CO2: 23 mmol/L (ref 20–29)
Calcium: 9.2 mg/dL (ref 8.6–10.2)
Chloride: 104 mmol/L (ref 96–106)
Creatinine, Ser: 1.42 mg/dL — ABNORMAL HIGH (ref 0.76–1.27)
Glucose: 141 mg/dL — ABNORMAL HIGH (ref 70–99)
Potassium: 4.8 mmol/L (ref 3.5–5.2)
Sodium: 140 mmol/L (ref 134–144)
eGFR: 50 mL/min/{1.73_m2} — ABNORMAL LOW (ref >59)

## 2023-01-26 LAB — HEMOGLOBIN A1C
Est. average glucose Bld gHb Est-mCnc: 186 mg/dL
Hgb A1c MFr Bld: 8.1 % — ABNORMAL HIGH (ref 4.8–5.6)

## 2023-01-27 ENCOUNTER — Telehealth: Payer: Self-pay | Admitting: *Deleted

## 2023-01-27 NOTE — Telephone Encounter (Signed)
Contacted regarding PREP Class referral. Left voice message to return call for more information. 

## 2023-01-31 ENCOUNTER — Other Ambulatory Visit: Payer: Self-pay

## 2023-01-31 MED ORDER — DAPAGLIFLOZIN PROPANEDIOL 10 MG PO TABS: 10.0000 mg | ORAL_TABLET | Freq: Every day | ORAL | 2 refills | Status: DC

## 2023-02-23 ENCOUNTER — Other Ambulatory Visit: Payer: Self-pay | Admitting: Internal Medicine

## 2023-02-23 DIAGNOSIS — C61 Malignant neoplasm of prostate: Secondary | ICD-10-CM | POA: Diagnosis not present

## 2023-02-28 ENCOUNTER — Encounter: Payer: Self-pay | Admitting: Internal Medicine

## 2023-02-28 ENCOUNTER — Ambulatory Visit: Payer: Medicare PPO | Admitting: Internal Medicine

## 2023-02-28 VITALS — BP 110/62 | HR 60 | Temp 97.4°F | Ht 67.0 in | Wt 164.0 lb

## 2023-02-28 DIAGNOSIS — N1831 Chronic kidney disease, stage 3a: Secondary | ICD-10-CM

## 2023-02-28 DIAGNOSIS — Z79899 Other long term (current) drug therapy: Secondary | ICD-10-CM | POA: Diagnosis not present

## 2023-02-28 DIAGNOSIS — E1122 Type 2 diabetes mellitus with diabetic chronic kidney disease: Secondary | ICD-10-CM

## 2023-02-28 NOTE — Progress Notes (Deleted)
I,Jose Ponce,acting as a scribe for Jose Aliment, MD.,have documented all relevant documentation on the behalf of Jose Aliment, MD,as directed by  Jose Aliment, MD while in the presence of Jose Aliment, MD.    Subjective:     Patient ID: Jose Ponce , male    DOB: 12-25-1940 , 82 y.o.   MRN: [id number]   Chief Complaint  Patient presents with   Diabetes   Hypertension    HPI  Pt presents today for Farxiga follow up along with bpc. He reports compliance with 10mg  dose. He reports no specific questions or concerns. Denies headache, chest pain, SOB.  He states his sugars are between 80-92. He states he is now walking twice weekly.   Diabetes He presents for his follow-up diabetic visit. He has type 2 diabetes mellitus. His disease course has been stable. There are no hypoglycemic associated symptoms. Pertinent negatives for hypoglycemia include no dizziness or headaches. Pertinent negatives for diabetes include no blurred vision, no fatigue, no polydipsia, no polyphagia and no polyuria. There are no hypoglycemic complications. Diabetic complications include nephropathy. Risk factors for coronary artery disease include diabetes mellitus, dyslipidemia, hypertension and male sex. He is following a diabetic diet. He participates in exercise three times a week. An ACE inhibitor/angiotensin II receptor blocker is being taken.     Past Medical History:  Diagnosis Date   Chronic kidney disease    Coronary artery disease    Gout    Hyperlipidemia    Hypertension    Prostate cancer    Type 2 diabetes mellitus      Family History  Problem Relation Age of Onset   Asthma Mother    Heart disease Father    Stroke Father    Heart attack Father 55   Cancer Sister 65   Diabetes Brother    Heart disease Maternal Grandmother      Current Outpatient Medications:    allopurinol (ZYLOPRIM) 100 MG tablet, TAKE 1/2 TABLET BY MOUTH EVERY DAY, Disp: 45 tablet, Rfl: 1   aspirin  81 MG tablet, Take 81 mg by mouth daily., Disp: , Rfl:    Cholecalciferol 125 MCG (5000 UT) CHEW, Chew 5,000 Units by mouth. , Disp: , Rfl:    dapagliflozin propanediol (FARXIGA) 10 MG TABS tablet, Take 1 tablet (10 mg total) by mouth daily before breakfast., Disp: 30 tablet, Rfl: 2   Diclofenac Sodium 3 % GEL, Place onto the skin., Disp: , Rfl:    famotidine (PEPCID) 20 MG tablet, TAKE 1 TABLET BY MOUTH twice daily, Disp: 180 tablet, Rfl: 1   Flaxseed, Linseed, (FLAX SEEDS PO), Take 1 tablet by mouth daily., Disp: , Rfl:    ketoconazole (NIZORAL) 2 % cream, Apply 1 application topically daily., Disp: , Rfl:    lidocaine (LIDODERM) 5 %, Place 1 patch onto the skin daily. Remove & Discard patch within 12 hours or as directed by MD Place over left lower back., Disp: 30 patch, Rfl: 0   linaclotide (LINZESS) 145 MCG CAPS capsule, Take 145 mcg by mouth daily before breakfast., Disp: , Rfl:    metoprolol succinate (TOPROL-XL) 50 MG 24 hr tablet, TAKE 1 TABLET BY MOUTH EVERY DAY, Disp: 90 tablet, Rfl: 1   rosuvastatin (CRESTOR) 10 MG tablet, TAKE 1 TABLET BY MOUTH EVERY DAY, Disp: 90 tablet, Rfl: 1   Semaglutide,0.25 or 0.5MG /DOS, (OZEMPIC, 0.25 OR 0.5 MG/DOSE,) 2 MG/3ML SOPN, Inject 0.25 mg into the skin once a week., Disp: 6  mL, Rfl: 1   sildenafil (VIAGRA) 50 MG tablet, Take 1 tablet (50 mg total) by mouth daily as needed for erectile dysfunction., Disp: 30 tablet, Rfl: 0   telmisartan (MICARDIS) 20 MG tablet, TAKE 1 TABLET BY MOUTH EVERY DAY, Disp: 90 tablet, Rfl: 1   triamcinolone cream (KENALOG) 0.1 %, APPLY TO AFFECTED AREA on hand TWICE DAILY AS NEEDED, Disp: 45 g, Rfl: 0  Current Facility-Administered Medications:    cyanocobalamin ((VITAMIN B-12)) injection 1,000 mcg, 1,000 mcg, Intramuscular, Once, Dorothyann Peng, MD   No Known Allergies   Review of Systems  Constitutional: Negative.  Negative for fatigue.  Eyes:  Negative for blurred vision.  Respiratory: Negative.    Cardiovascular:  Negative.   Gastrointestinal: Negative.   Endocrine: Negative for polydipsia, polyphagia and polyuria.  Skin: Negative.   Allergic/Immunologic: Negative.   Neurological: Negative.  Negative for dizziness and headaches.  Hematological: Negative.      Today's Vitals   02/28/23 1157  BP: 110/62  Pulse: 60  Temp: (!) 97.4 F (36.3 C)  SpO2: 96%  Weight: 164 lb (74.4 kg)  Height: 5\' 7"  (1.702 m)   Body mass index is 25.69 kg/m.  Wt Readings from Last 3 Encounters:  02/28/23 164 lb (74.4 kg)  01/25/23 165 lb (74.8 kg)  01/25/23 165 lb 9.6 oz (75.1 kg)    Objective:  Physical Exam Vitals and nursing note reviewed.  Constitutional:      Appearance: Normal appearance.  HENT:     Head: Normocephalic and atraumatic.     Nose:     Comments: Masked     Mouth/Throat:     Comments: Masked Eyes:     Extraocular Movements: Extraocular movements intact.  Cardiovascular:     Rate and Rhythm: Normal rate and regular rhythm.     Heart sounds: Normal heart sounds.  Pulmonary:     Effort: Pulmonary effort is normal.     Breath sounds: Normal breath sounds.  Musculoskeletal:     Cervical back: Normal range of motion.  Skin:    General: Skin is warm.  Neurological:     General: No focal deficit present.     Mental Status: He is alert.  Psychiatric:        Mood and Affect: Mood normal.      Assessment And Plan:     1. Type 2 diabetes mellitus with stage 3a chronic kidney disease, without long-term current use of insulin Comments: He has tolerated Farxiga 10mg  daily. I will check a BMP today.  He will rto in 2 months his next a1c check. - BMP8+eGFR  2. Polypharmacy   Patient was given opportunity to ask questions. Patient verbalized understanding of the plan and was able to repeat key elements of the plan. All questions were answered to their satisfaction.   I, Jose Aliment, MD, have reviewed all documentation for this visit. The documentation on 02/28/23 for the exam,  diagnosis, procedures, and orders are all accurate and complete.   IF YOU HAVE BEEN REFERRED TO A SPECIALIST, IT MAY TAKE 1-2 WEEKS TO SCHEDULE/PROCESS THE REFERRAL. IF YOU HAVE NOT HEARD FROM US/SPECIALIST IN TWO WEEKS, PLEASE GIVE [place] A CALL AT [phone].   THE PATIENT IS ENCOURAGED TO PRACTICE SOCIAL DISTANCING DUE TO THE COVID-19 PANDEMIC.

## 2023-02-28 NOTE — Patient Instructions (Signed)

## 2023-02-28 NOTE — Progress Notes (Signed)
I,[name],acting as a scribe for [name], MD.,have documented all relevant documentation on the behalf of [name], MD,as directed by  [name], MD while in the presence of [name], MD.    Subjective:     Patient ID: Jose Ponce , male    DOB: 10/26/1941 , 82 y.o.   MRN: [id number]   Chief Complaint  Patient presents with   Diabetes   Hypertension    HPI  Pt presents today for Farxiga follow up.  The dose was increased to 10mg  at his last visit for improved glycemic control. He has not had any problems with the medication.  He denies having any specific questions or concerns. Denies headache, chest pain, and SOB.     Past Medical History:  Diagnosis Date   Chronic kidney disease    Coronary artery disease    Gout    Hyperlipidemia    Hypertension    Prostate cancer    Type 2 diabetes mellitus      Family History  Problem Relation Age of Onset   Asthma Mother    Heart disease Father    Stroke Father    Heart attack Father 50   Cancer Sister 24   Diabetes Brother    Heart disease Maternal Grandmother      Current Outpatient Medications:    allopurinol (ZYLOPRIM) 100 MG tablet, TAKE 1/2 TABLET BY MOUTH EVERY DAY, Disp: 45 tablet, Rfl: 1   aspirin 81 MG tablet, Take 81 mg by mouth daily., Disp: , Rfl:    Cholecalciferol 125 MCG (5000 UT) CHEW, Chew 5,000 Units by mouth. , Disp: , Rfl:    dapagliflozin propanediol (FARXIGA) 10 MG TABS tablet, Take 1 tablet (10 mg total) by mouth daily before breakfast., Disp: 30 tablet, Rfl: 2   Diclofenac Sodium 3 % GEL, Place onto the skin., Disp: , Rfl:    famotidine (PEPCID) 20 MG tablet, TAKE 1 TABLET BY MOUTH twice daily, Disp: 180 tablet, Rfl: 1   Flaxseed, Linseed, (FLAX SEEDS PO), Take 1 tablet by mouth daily., Disp: , Rfl:    ketoconazole (NIZORAL) 2 % cream, Apply 1 application topically daily., Disp: , Rfl:    lidocaine (LIDODERM) 5 %, Place 1 patch onto the skin daily. Remove &  Discard patch within 12 hours or as directed by MD Place over left lower back., Disp: 30 patch, Rfl: 0   linaclotide (LINZESS) 145 MCG CAPS capsule, Take 145 mcg by mouth daily before breakfast., Disp: , Rfl:    metoprolol succinate (TOPROL-XL) 50 MG 24 hr tablet, TAKE 1 TABLET BY MOUTH EVERY DAY, Disp: 90 tablet, Rfl: 1   rosuvastatin (CRESTOR) 10 MG tablet, TAKE 1 TABLET BY MOUTH EVERY DAY, Disp: 90 tablet, Rfl: 1   Semaglutide,0.25 or 0.5MG /DOS, (OZEMPIC, 0.25 OR 0.5 MG/DOSE,) 2 MG/3ML SOPN, Inject 0.25 mg into the skin once a week., Disp: 6 mL, Rfl: 1   sildenafil (VIAGRA) 50 MG tablet, Take 1 tablet (50 mg total) by mouth daily as needed for erectile dysfunction., Disp: 30 tablet, Rfl: 0   telmisartan (MICARDIS) 20 MG tablet, TAKE 1 TABLET BY MOUTH EVERY DAY, Disp: 90 tablet, Rfl: 1   triamcinolone cream (KENALOG) 0.1 %, APPLY TO AFFECTED AREA on hand TWICE DAILY AS NEEDED, Disp: 45 g, Rfl: 0  Current Facility-Administered Medications:    cyanocobalamin ((VITAMIN B-12)) injection 1,000 mcg, 1,000 mcg, Intramuscular, Once, Dorothyann Peng, MD   No Known Allergies   Review  of Systems  Constitutional: Negative.   Respiratory: Negative.    Cardiovascular: Negative.   Gastrointestinal: Negative.   Skin: Negative.   Allergic/Immunologic: Negative.   Neurological: Negative.   Hematological: Negative.      Today's Vitals   02/28/23 1157  BP: 110/62  Pulse: 60  Temp: (!) 97.4 F (36.3 C)  SpO2: 96%  Weight: 164 lb (74.4 kg)  Height: 5\' 7"  (1.702 m)   Body mass index is 25.69 kg/m.  Wt Readings from Last 3 Encounters:  02/28/23 164 lb (74.4 kg)  01/25/23 165 lb (74.8 kg)  01/25/23 165 lb 9.6 oz (75.1 kg)    Objective:  Physical Exam Vitals and nursing note reviewed.  Constitutional:      Appearance: Normal appearance.  HENT:     Head: Normocephalic and atraumatic.     Nose:     Comments: Masked     Mouth/Throat:     Comments: Masked  Eyes:     Extraocular Movements:  Extraocular movements intact.  Cardiovascular:     Rate and Rhythm: Normal rate and regular rhythm.     Heart sounds: Normal heart sounds.  Pulmonary:     Effort: Pulmonary effort is normal.     Breath sounds: Normal breath sounds.  Musculoskeletal:     Cervical back: Normal range of motion.  Skin:    General: Skin is warm.  Neurological:     General: No focal deficit present.     Mental Status: He is alert.  Psychiatric:        Mood and Affect: Mood normal.       Assessment And Plan:     1. Type 2 diabetes mellitus with stage 3a chronic kidney disease, without long-term current use of insulin Comments: He has tolerated [place] 10mg  daily. I will check a BMP today.  He will rto in 2 months his next a1c check. - BMP8+eGFR  2. Polypharmacy   Patient was given opportunity to ask questions. Patient verbalized understanding of the plan and was able to repeat key elements of the plan. All questions were answered to their satisfaction.   I, [name], MD, have reviewed all documentation for this visit. The documentation on 02/28/23 for the exam, diagnosis, procedures, and orders are all accurate and complete.   IF YOU HAVE BEEN REFERRED TO A SPECIALIST, IT MAY TAKE 1-2 WEEKS TO SCHEDULE/PROCESS THE REFERRAL. IF YOU HAVE NOT HEARD FROM US/SPECIALIST IN TWO WEEKS, PLEASE GIVE [place] A CALL AT [phone].   THE PATIENT IS ENCOURAGED TO PRACTICE SOCIAL DISTANCING DUE TO THE COVID-19 PANDEMIC.

## 2023-03-01 LAB — BMP8+EGFR
BUN/Creatinine Ratio: 16 (ref 10–24)
BUN: 25 mg/dL (ref 8–27)
CO2: 23 mmol/L (ref 20–29)
Calcium: 9.3 mg/dL (ref 8.6–10.2)
Chloride: 103 mmol/L (ref 96–106)
Creatinine, Ser: 1.59 mg/dL — ABNORMAL HIGH (ref 0.76–1.27)
Glucose: 159 mg/dL — ABNORMAL HIGH (ref 70–99)
Potassium: 4.3 mmol/L (ref 3.5–5.2)
Sodium: 142 mmol/L (ref 134–144)
eGFR: 43 mL/min/{1.73_m2} — ABNORMAL LOW (ref >59)

## 2023-03-21 ENCOUNTER — Other Ambulatory Visit: Payer: Self-pay | Admitting: Internal Medicine

## 2023-04-03 ENCOUNTER — Encounter: Payer: Self-pay | Admitting: Internal Medicine

## 2023-04-03 ENCOUNTER — Ambulatory Visit (INDEPENDENT_AMBULATORY_CARE_PROVIDER_SITE_OTHER): Payer: Medicare PPO | Admitting: Internal Medicine

## 2023-04-03 VITALS — BP 118/82 | HR 76 | Temp 97.7°F | Ht 67.0 in | Wt 163.2 lb

## 2023-04-03 DIAGNOSIS — E1122 Type 2 diabetes mellitus with diabetic chronic kidney disease: Secondary | ICD-10-CM | POA: Diagnosis not present

## 2023-04-03 DIAGNOSIS — Z0001 Encounter for general adult medical examination with abnormal findings: Secondary | ICD-10-CM | POA: Diagnosis not present

## 2023-04-03 DIAGNOSIS — I1310 Hypertensive heart and chronic kidney disease without heart failure, with stage 1 through stage 4 chronic kidney disease, or unspecified chronic kidney disease: Secondary | ICD-10-CM | POA: Diagnosis not present

## 2023-04-03 DIAGNOSIS — I25118 Atherosclerotic heart disease of native coronary artery with other forms of angina pectoris: Secondary | ICD-10-CM

## 2023-04-03 DIAGNOSIS — K219 Gastro-esophageal reflux disease without esophagitis: Secondary | ICD-10-CM

## 2023-04-03 DIAGNOSIS — N1831 Chronic kidney disease, stage 3a: Secondary | ICD-10-CM | POA: Diagnosis not present

## 2023-04-03 DIAGNOSIS — Z6825 Body mass index (BMI) 25.0-25.9, adult: Secondary | ICD-10-CM

## 2023-04-03 DIAGNOSIS — Z0000 Encounter for general adult medical examination without abnormal findings: Secondary | ICD-10-CM

## 2023-04-03 LAB — POCT URINALYSIS DIPSTICK
Bilirubin, UA: NEGATIVE
Glucose, UA: POSITIVE — AB
Ketones, UA: NEGATIVE
Leukocytes, UA: NEGATIVE
Nitrite, UA: NEGATIVE
Protein, UA: NEGATIVE
Spec Grav, UA: 1.025 (ref 1.010–1.025)
Urobilinogen, UA: 0.2 E.U./dL
pH, UA: 6 (ref 5.0–8.0)

## 2023-04-03 NOTE — Patient Instructions (Signed)
Health Maintenance, Male Adopting a healthy lifestyle and getting preventive care are important in promoting health and wellness. Ask your health care provider about: The right schedule for you to have regular tests and exams. Things you can do on your own to prevent diseases and keep yourself healthy. What should I know about diet, weight, and exercise? Eat a healthy diet  Eat a diet that includes plenty of vegetables, fruits, low-fat dairy products, and lean protein. Do not eat a lot of foods that are high in solid fats, added sugars, or sodium. Maintain a healthy weight Body mass index (BMI) is a measurement that can be used to identify possible weight problems. It estimates body fat based on height and weight. Your health care provider can help determine your BMI and help you achieve or maintain a healthy weight. Get regular exercise Get regular exercise. This is one of the most important things you can do for your health. Most adults should: Exercise for at least 150 minutes each week. The exercise should increase your heart rate and make you sweat (moderate-intensity exercise). Do strengthening exercises at least twice a week. This is in addition to the moderate-intensity exercise. Spend less time sitting. Even light physical activity can be beneficial. Watch cholesterol and blood lipids Have your blood tested for lipids and cholesterol at 81 years of age, then have this test every 5 years. You may need to have your cholesterol levels checked more often if: Your lipid or cholesterol levels are high. You are older than 81 years of age. You are at high risk for heart disease. What should I know about cancer screening? Many types of cancers can be detected early and may often be prevented. Depending on your health history and family history, you may need to have cancer screening at various ages. This may include screening for: Colorectal cancer. Prostate cancer. Skin cancer. Lung  cancer. What should I know about heart disease, diabetes, and high blood pressure? Blood pressure and heart disease High blood pressure causes heart disease and increases the risk of stroke. This is more likely to develop in people who have high blood pressure readings or are overweight. Talk with your health care provider about your target blood pressure readings. Have your blood pressure checked: Every 3-5 years if you are 18-39 years of age. Every year if you are 40 years old or older. If you are between the ages of 65 and 75 and are a current or former smoker, ask your health care provider if you should have a one-time screening for abdominal aortic aneurysm (AAA). Diabetes Have regular diabetes screenings. This checks your fasting blood sugar level. Have the screening done: Once every three years after age 45 if you are at a normal weight and have a low risk for diabetes. More often and at a younger age if you are overweight or have a high risk for diabetes. What should I know about preventing infection? Hepatitis B If you have a higher risk for hepatitis B, you should be screened for this virus. Talk with your health care provider to find out if you are at risk for hepatitis B infection. Hepatitis C Blood testing is recommended for: Everyone born from 1945 through 1965. Anyone with known risk factors for hepatitis C. Sexually transmitted infections (STIs) You should be screened each year for STIs, including gonorrhea and chlamydia, if: You are sexually active and are younger than 81 years of age. You are older than 81 years of age and your   health care provider tells you that you are at risk for this type of infection. Your sexual activity has changed since you were last screened, and you are at increased risk for chlamydia or gonorrhea. Ask your health care provider if you are at risk. Ask your health care provider about whether you are at high risk for HIV. Your health care provider  may recommend a prescription medicine to help prevent HIV infection. If you choose to take medicine to prevent HIV, you should first get tested for HIV. You should then be tested every 3 months for as long as you are taking the medicine. Follow these instructions at home: Alcohol use Do not drink alcohol if your health care provider tells you not to drink. If you drink alcohol: Limit how much you have to 0-2 drinks a day. Know how much alcohol is in your drink. In the [place] one drink equals one 12 oz bottle of beer (355 mL), one 5 oz glass of wine (148 mL), or one 1 oz glass of hard liquor (44 mL). Lifestyle Do not use any products that contain nicotine or tobacco. These products include cigarettes, chewing tobacco, and vaping devices, such as e-cigarettes. If you need help quitting, ask your health care provider. Do not use street drugs. Do not share needles. Ask your health care provider for help if you need support or information about quitting drugs. General instructions Schedule regular health, dental, and eye exams. Stay current with your vaccines. Tell your health care provider if: You often feel depressed. You have ever been abused or do not feel safe at home. Summary Adopting a healthy lifestyle and getting preventive care are important in promoting health and wellness. Follow your health care provider's instructions about healthy diet, exercising, and getting tested or screened for diseases. Follow your health care provider's instructions on monitoring your cholesterol and blood pressure. This information is not intended to replace advice given to you by your health care provider. Make sure you discuss any questions you have with your health care provider. Document Revised: 03/29/2021 Document Reviewed: 03/29/2021 Elsevier Patient Education  2023 Elsevier Inc.  

## 2023-04-03 NOTE — Progress Notes (Signed)
I,Jose Ponce,acting as a scribe for Jose Aliment, MD.,have documented all relevant documentation on the behalf of Jose Aliment, MD,as directed by  Jose Aliment, MD while in the presence of Jose Aliment, MD.   Subjective:     Patient ID: Jose Ponce , male    DOB: January 08, 1941 , 82 y.o.   MRN: [id number]   Chief Complaint  Patient presents with   Annual Exam   Diabetes   Hypertension    HPI  He is here today for a full physical examination. Reports compliance with meds, no concerns today.  He denies headaches, chest pain and shortness of breath.   Diabetes He presents for his follow-up diabetic visit. He has type 2 diabetes mellitus. His disease course has been stable. There are no hypoglycemic associated symptoms. Pertinent negatives for hypoglycemia include no headaches. Pertinent negatives for diabetes include no blurred vision and no chest pain. There are no hypoglycemic complications. Diabetic complications include nephropathy. Risk factors for coronary artery disease include diabetes mellitus, dyslipidemia, hypertension and male sex. He participates in exercise intermittently. An ACE inhibitor/angiotensin II receptor blocker is being taken. Eye exam is current.  Hypertension This is a chronic problem. The current episode started more than 1 year ago. The problem has been gradually improving since onset. Pertinent negatives include no blurred vision, chest pain, headaches, palpitations or shortness of breath. Risk factors for coronary artery disease include diabetes mellitus, dyslipidemia and male gender. The current treatment provides moderate improvement. Compliance problems include exercise.  Hypertensive end-organ damage includes CAD/MI.     Past Medical History:  Diagnosis Date   Chronic kidney disease    Coronary artery disease    Gout    Hyperlipidemia    Hypertension    Prostate cancer (HCC)    Type 2 diabetes mellitus (HCC)      Family History   Problem Relation Age of Onset   Asthma Mother    Heart disease Father    Stroke Father    Heart attack Father 52   Cancer Sister 70   Diabetes Brother    Heart disease Maternal Grandmother      Current Outpatient Medications:    allopurinol (ZYLOPRIM) 100 MG tablet, TAKE 1/2 TABLET BY MOUTH EVERY DAY, Disp: 45 tablet, Rfl: 1   aspirin 81 MG tablet, Take 81 mg by mouth daily., Disp: , Rfl:    Cholecalciferol 125 MCG (5000 UT) CHEW, Chew 5,000 Units by mouth. , Disp: , Rfl:    dapagliflozin propanediol (FARXIGA) 10 MG TABS tablet, Take 1 tablet (10 mg total) by mouth daily before breakfast., Disp: 30 tablet, Rfl: 2   Diclofenac Sodium 3 % GEL, Place onto the skin., Disp: , Rfl:    famotidine (PEPCID) 20 MG tablet, TAKE 1 TABLET BY MOUTH twice daily, Disp: 180 tablet, Rfl: 1   Flaxseed, Linseed, (FLAX SEEDS PO), Take 1 tablet by mouth daily., Disp: , Rfl:    ketoconazole (NIZORAL) 2 % cream, Apply 1 application topically daily., Disp: , Rfl:    lidocaine (LIDODERM) 5 %, Place 1 patch onto the skin daily. Remove & Discard patch within 12 hours or as directed by MD Place over left lower back., Disp: 30 patch, Rfl: 0   linaclotide (LINZESS) 145 MCG CAPS capsule, Take 145 mcg by mouth daily before breakfast., Disp: , Rfl:    metoprolol succinate (TOPROL-XL) 50 MG 24 hr tablet, TAKE 1 TABLET BY MOUTH EVERY DAY, Disp: 90 tablet, Rfl: 1  rosuvastatin (CRESTOR) 10 MG tablet, TAKE 1 TABLET BY MOUTH EVERY DAY, Disp: 90 tablet, Rfl: 1   Semaglutide,0.25 or 0.5MG /DOS, (OZEMPIC, 0.25 OR 0.5 MG/DOSE,) 2 MG/3ML SOPN, Inject 0.25 mg into the skin once a week., Disp: 6 mL, Rfl: 1   sildenafil (VIAGRA) 50 MG tablet, Take 1 tablet (50 mg total) by mouth daily as needed for erectile dysfunction., Disp: 30 tablet, Rfl: 0   telmisartan (MICARDIS) 20 MG tablet, TAKE 1 TABLET BY MOUTH EVERY DAY, Disp: 90 tablet, Rfl: 1   triamcinolone cream (KENALOG) 0.1 %, APPLY TO AFFECTED AREA on hand TWICE DAILY AS NEEDED,  Disp: 45 g, Rfl: 0  Current Facility-Administered Medications:    cyanocobalamin ((VITAMIN B-12)) injection 1,000 mcg, 1,000 mcg, Intramuscular, Once, Dorothyann Peng, MD   No Known Allergies   Men's preventive visit. Patient Health Questionnaire (PHQ-2) is  Flowsheet Row Clinical Support from 01/25/2023 in [facility]  PHQ-2 Total Score 0     . Patient is on a heart healthy diet. Marital status: Divorced. Relevant history for alcohol use is:  Social History   Substance and Sexual Activity  Alcohol Use Not Currently  . Relevant history for tobacco use is:  Social History   Tobacco Use  Smoking Status Former   Packs/day: 0.25   Years: 20.00   Additional pack years: 0.00   Total pack years: 5.00   Types: Cigarettes   Quit date: 11/21/1984   Years since quitting: 38.4  Smokeless Tobacco Never  .   Review of Systems  Constitutional: Negative.   HENT: Negative.    Eyes: Negative.  Negative for blurred vision.  Respiratory: Negative.  Negative for shortness of breath.   Cardiovascular:  Negative for chest pain and palpitations.  Gastrointestinal: Negative.   Endocrine: Negative.   Genitourinary: Negative.   Musculoskeletal: Negative.   Skin: Negative.   Allergic/Immunologic: Negative.   Neurological: Negative.  Negative for headaches.  Hematological: Negative.   Psychiatric/Behavioral: Negative.       Today's Vitals   04/03/23 1436  BP: 118/82  Pulse: 76  Temp: 97.7 F (36.5 C)  SpO2: 98%  Weight: 163 lb 3.2 oz (74 kg)  Height: 5\' 7"  (1.702 m)   Body mass index is 25.56 kg/m.  Wt Readings from Last 3 Encounters:  04/03/23 163 lb 3.2 oz (74 kg)  02/28/23 164 lb (74.4 kg)  01/25/23 165 lb (74.8 kg)    Objective:  Physical Exam Vitals and nursing note reviewed.  Constitutional:      Appearance: Normal appearance.  HENT:     Head: Normocephalic and atraumatic.     Right Ear: Tympanic membrane, ear canal and external ear  normal.     Left Ear: Tympanic membrane, ear canal and external ear normal.     Nose: Nose normal.     Mouth/Throat:     Mouth: Mucous membranes are moist.     Pharynx: Oropharynx is clear.  Eyes:     Extraocular Movements: Extraocular movements intact.     Conjunctiva/sclera: Conjunctivae normal.     Pupils: Pupils are equal, round, and reactive to light.  Cardiovascular:     Rate and Rhythm: Normal rate and regular rhythm.     Pulses: Normal pulses.          Dorsalis pedis pulses are 2+ on the right side and 2+ on the left side.     Heart sounds: Normal heart sounds.  Pulmonary:     Effort: Pulmonary effort  is normal.     Breath sounds: Normal breath sounds.  Chest:  Breasts:    Right: Normal. No swelling, bleeding, inverted nipple, mass or nipple discharge.     Left: Normal. No swelling, bleeding, inverted nipple, mass or nipple discharge.  Abdominal:     General: Abdomen is flat. Bowel sounds are normal.     Palpations: Abdomen is soft.  Genitourinary:    Comments: Deferred  Musculoskeletal:        General: Normal range of motion.     Cervical back: Normal range of motion and neck supple.  Feet:     Right foot:     Protective Sensation: 5 sites tested.  5 sites sensed.     Skin integrity: Skin integrity normal.     Toenail Condition: Right toenails are normal.     Left foot:     Protective Sensation: 5 sites tested.  5 sites sensed.     Skin integrity: Skin integrity normal.     Toenail Condition: Left toenails are normal.  Skin:    General: Skin is warm.  Neurological:     General: No focal deficit present.     Mental Status: He is alert.  Psychiatric:        Mood and Affect: Mood normal.        Behavior: Behavior normal.      Assessment And Plan:    1. Encounter for annual health examination Comments: A full exam was performed. DRE deferred.  He has DRE performed at Urology appts.  PATIENT IS ADVISED TO GET 30-45 MINUTES REGULAR EXERCISE NO LESS THAN FOUR TO  FIVE DAYS PER WEEK - BOTH WEIGHTBEARING EXERCISES AND AEROBIC ARE RECOMMENDED.  PATIENT IS ADVISED TO FOLLOW A HEALTHY DIET WITH AT LEAST SIX FRUITS/VEGGIES PER DAY, DECREASE INTAKE OF RED MEAT, AND TO INCREASE FISH INTAKE TO TWO DAYS PER WEEK.  MEATS/FISH SHOULD NOT BE FRIED, BAKED OR BROILED IS PREFERABLE.  IT IS ALSO IMPORTANT TO CUT BACK ON YOUR SUGAR INTAKE. PLEASE AVOID ANYTHING WITH ADDED SUGAR, CORN SYRUP OR OTHER SWEETENERS. IF YOU MUST USE A SWEETENER, YOU CAN TRY STEVIA. IT IS ALSO IMPORTANT TO AVOID ARTIFICIALLY SWEETENERS AND DIET BEVERAGES. LASTLY, I SUGGEST WEARING SPF 50 SUNSCREEN ON EXPOSED PARTS AND ESPECIALLY WHEN IN THE DIRECT SUNLIGHT FOR AN EXTENDED PERIOD OF TIME.  PLEASE AVOID FAST FOOD RESTAURANTS AND INCREASE YOUR WATER INTAKE.  2. Type 2 diabetes mellitus with stage 3a chronic kidney disease, without long-term current use of insulin (HCC) Comments: Chronic, diabetic foot exam performed. No med changes. He will c/w Farxiga 10mg  and Ozempic 0.25mg  weekly.  He will f/u in 4 months.  I DISCUSSED WITH THE PATIENT AT LENGTH REGARDING THE GOALS OF GLYCEMIC CONTROL AND POSSIBLE LONG-TERM COMPLICATIONS.  I  ALSO STRESSED THE IMPORTANCE OF COMPLIANCE WITH HOME GLUCOSE MONITORING, DIETARY RESTRICTIONS INCLUDING AVOIDANCE OF SUGARY DRINKS/PROCESSED FOODS,  ALONG WITH REGULAR EXERCISE.  I  ALSO STRESSED THE IMPORTANCE OF ANNUAL EYE EXAMS, SELF FOOT CARE AND COMPLIANCE WITH OFFICE VISITS. - Liver Profile - Lipid panel - Hemoglobin A1c  3. Benign hypertensive heart and renal disease Comments: Chronic, well controlled. EKG not performed, Jan 2024 ekg reviewed. He will c/w telmisartan 20mg  and metoprolol succinate 50mg  daily.  He is encouraged to follow low sodium diet. I will check renal function today. He will f/u in four to six months for re-evaluation.  - POCT Urinalysis Dipstick (81002) - Microalbumin / Creatinine Urine Ratio  4. Coronary artery disease involving  native coronary artery of  native heart with other form of angina pectoris (HCC) Comments: Chronic, s/p CABG. He will c/w ASA, statin therapy and BBlocker therapy. - Liver Profile - Lipid panel  5. Stage 3a chronic kidney disease (HCC) Comments: Chronic, reminded to avoid NSAIDs, stay well hydrated and keep BP/BS controlled to decrease risk of CKD progression.  6. Gastroesophageal reflux disease without esophagitis Comments: Chronic, sx are stable with once daily famotidine.  7. BMI 25.0-25.9,adult Comments: He is encouraged to aim for at least 150 minutes of exercise/week.    Return for 1 year HM, 4 month bp & dm--Tuesdays preferably.  Patient was given opportunity to ask questions. Patient verbalized understanding of the plan and was able to repeat key elements of the plan. All questions were answered to their satisfaction.   I, Jose Aliment, MD, have reviewed all documentation for this visit. The documentation on 04/03/23 for the exam, diagnosis, procedures, and orders are all accurate and complete.   THE PATIENT IS ENCOURAGED TO PRACTICE SOCIAL DISTANCING DUE TO THE COVID-19 PANDEMIC.

## 2023-04-04 LAB — HEPATIC FUNCTION PANEL
ALT: 20 IU/L (ref 0–44)
AST: 20 IU/L (ref 0–40)
Albumin: 4.5 g/dL (ref 3.7–4.7)
Alkaline Phosphatase: 83 IU/L (ref 44–121)
Bilirubin Total: 0.7 mg/dL (ref 0.0–1.2)
Bilirubin, Direct: 0.21 mg/dL (ref 0.00–0.40)
Total Protein: 7.7 g/dL (ref 6.0–8.5)

## 2023-04-04 LAB — MICROALBUMIN / CREATININE URINE RATIO
Creatinine, Urine: 69.1 mg/dL
Microalb/Creat Ratio: 22 mg/g creat (ref 0–29)
Microalbumin, Urine: 14.9 ug/mL

## 2023-04-04 LAB — HEMOGLOBIN A1C
Est. average glucose Bld gHb Est-mCnc: 186 mg/dL
Hgb A1c MFr Bld: 8.1 % — ABNORMAL HIGH (ref 4.8–5.6)

## 2023-04-04 LAB — LIPID PANEL
Chol/HDL Ratio: 2.5 ratio (ref 0.0–5.0)
Cholesterol, Total: 144 mg/dL (ref 100–199)
HDL: 58 mg/dL (ref >39)
LDL Chol Calc (NIH): 70 mg/dL (ref 0–99)
Triglycerides: 84 mg/dL (ref 0–149)
VLDL Cholesterol Cal: 16 mg/dL (ref 5–40)

## 2023-05-04 ENCOUNTER — Other Ambulatory Visit: Payer: Self-pay

## 2023-05-04 DIAGNOSIS — E1122 Type 2 diabetes mellitus with diabetic chronic kidney disease: Secondary | ICD-10-CM

## 2023-05-04 MED ORDER — DAPAGLIFLOZIN PROPANEDIOL 10 MG PO TABS
10.0000 mg | ORAL_TABLET | Freq: Every day | ORAL | 2 refills | Status: AC
Start: 2023-05-04 — End: ?

## 2023-05-16 ENCOUNTER — Other Ambulatory Visit: Payer: Self-pay | Admitting: Internal Medicine

## 2023-07-18 ENCOUNTER — Ambulatory Visit (INDEPENDENT_AMBULATORY_CARE_PROVIDER_SITE_OTHER): Payer: Medicare PPO

## 2023-07-18 VITALS — BP 102/70 | HR 56

## 2023-07-18 DIAGNOSIS — Z23 Encounter for immunization: Secondary | ICD-10-CM | POA: Diagnosis not present

## 2023-08-03 ENCOUNTER — Ambulatory Visit (HOSPITAL_BASED_OUTPATIENT_CLINIC_OR_DEPARTMENT_OTHER): Payer: Medicare PPO | Admitting: Cardiovascular Disease

## 2023-08-03 ENCOUNTER — Encounter (HOSPITAL_BASED_OUTPATIENT_CLINIC_OR_DEPARTMENT_OTHER): Payer: Self-pay | Admitting: Cardiovascular Disease

## 2023-08-03 VITALS — BP 125/65 | HR 66 | Ht 67.0 in | Wt 168.9 lb

## 2023-08-03 DIAGNOSIS — N1831 Chronic kidney disease, stage 3a: Secondary | ICD-10-CM | POA: Diagnosis not present

## 2023-08-03 DIAGNOSIS — I10 Essential (primary) hypertension: Secondary | ICD-10-CM | POA: Diagnosis not present

## 2023-08-03 DIAGNOSIS — I2510 Atherosclerotic heart disease of native coronary artery without angina pectoris: Secondary | ICD-10-CM

## 2023-08-03 DIAGNOSIS — E7800 Pure hypercholesterolemia, unspecified: Secondary | ICD-10-CM | POA: Diagnosis not present

## 2023-08-03 NOTE — Progress Notes (Deleted)
  Cardiology Office Note:  .   Date:  08/03/2023  ID:  Jose Ponce, DOB June 16, 1941, MRN [id number] PCP: Dorothyann Peng, MD  [facility] Providers Cardiologist:  Chilton Si, MD { Click to update primary MD,subspecialty MD or APP then REFRESH:1}   History of Present Illness: .   Jose Ponce is a 82 y.o. male with CAD s/p CABG (LIMA-->LAD, SVG-->D, SVG-->OM, SVG-->LCx in 2005), hypertension, hyperlipidemia, and DM who presents for follow up.  He last had a Lexiscan Myoview 05/2011 that revealed LVEF 57% and was negative for ischemia.  Prior to his CABG he noted his gait was unstable.  He was referred for a stress thest that was abnormal and subsequent cath showed obstructive CAD.  He underwent CABG in 2005.  Since that time he has been feeling well.  He has no chest pain or shortness of breath.  He does not get much formal exercise but is very active.  He denies lower extremity edema, orthopnea, or PND.  He also has not noted any lightheadedness or dizziness.  He was diagnosed with prostate cancer 5 years ago and had radioactive seeds implanted.  His PSA has been well-controlled since that time.  He quit smoking in 1986 after a 10 year history of smoking less than 1 ppd.    At his appointment 02/2022 GI symptoms but no cardiac complaints. He was seen in the ED 11/2022 with chest pain x2 days.  Cardiac enzymes were negative.  Symptoms resolved with GI cocktail.  He followed up with Wallis Bamberg, NP and symptoms had resolved.    ROS: ***  Studies Reviewed: .        *** Risk Assessment/Calculations:   {Does this patient have ATRIAL FIBRILLATION?[phone]} No BP recorded.  {Refresh Note OR Click here to enter BP  :1}***       Physical Exam:   VS:  There were no vitals taken for this visit.   Wt Readings from Last 3 Encounters:  04/03/23 163 lb 3.2 oz (74 kg)  02/28/23 164 lb (74.4 kg)  01/25/23 165 lb (74.8 kg)    GEN: Well nourished, well developed in no acute  distress NECK: No JVD; No carotid bruits CARDIAC: ***RRR, no murmurs, rubs, gallops RESPIRATORY:  Clear to auscultation without rales, wheezing or rhonchi  ABDOMEN: Soft, non-tender, non-distended EXTREMITIES:  No edema; No deformity   ASSESSMENT AND PLAN: .   ***    {Are you ordering a CV Procedure (e.g. stress test, cath, DCCV, TEE, etc)?   Press F2        :[id number]}  Dispo: ***  Signed, Chilton Si, MD

## 2023-08-03 NOTE — Patient Instructions (Signed)
Medication Instructions:  Your physician recommends that you continue on your current medications as directed. Please refer to the Current Medication list given to you today.  *If you need a refill on your cardiac medications before your next appointment, please call your pharmacy*  Lab Work: NONE  Testing/Procedures: NONE  Follow-Up: At [facility], you and your health needs are our priority.  As part of our continuing mission to provide you with exceptional heart care, we have created designated Provider Care Teams.  These Care Teams include your primary Cardiologist (physician) and Advanced Practice Providers (APPs -  Physician Assistants and Nurse Practitioners) who all work together to provide you with the care you need, when you need it.  We recommend signing up for the patient portal called "MyChart".  Sign up information is provided on this After Visit Summary.  MyChart is used to connect with patients for Virtual Visits (Telemedicine).  Patients are able to view lab/test results, encounter notes, upcoming appointments, etc.  Non-urgent messages can be sent to your provider as well.   To learn more about what you can do with MyChart, go to [email or web address].    Your next appointment:   6 month(s)  The format for your next appointment:   In Person  Provider:   DR [place] OR Ronn Melena NP   YOU HAVE BEEN REFERRED TO PREP, LORA OR PAM WILL REACH OUT TO YOU

## 2023-08-03 NOTE — Progress Notes (Signed)
Cardiology Office Note:  .    Date:  08/03/2023  ID:  Jose Ponce, DOB [birth year], MRN [id number] PCP: Dorothyann Peng, MD  [facility] Providers Cardiologist:  Chilton Si, MD     History of Present Illness: .    Jose Ponce is a 82 y.o. male with CAD s/p CABG (LIMA-->LAD, SVG-->D, SVG-->OM, SVG-->LCx in 2005), hypertension, hyperlipidemia, and DM who presents for follow up.  He last had a Lexiscan Myoview 05/2011 that revealed LVEF 57% and was negative for ischemia.  Prior to his CABG he noted his gait was unstable.  He was referred for a stress thest that was abnormal and subsequent cath showed obstructive CAD.  He underwent CABG in 2005.  Since that time he has been feeling well.  He has no chest pain or shortness of breath.  He does not get much formal exercise but is very active.  He denies lower extremity edema, orthopnea, or PND.  He also has not noted any lightheadedness or dizziness.  He was diagnosed with prostate cancer 5 years ago and had radioactive seeds implanted.  His PSA has been well-controlled since that time.  He quit smoking in 1986 after a 10 year history of smoking less than 1 ppd.    At his appointment 02/2022 GI symptoms but no cardiac complaints. He was seen in the ED 11/2022 with chest pain x2 days.  Cardiac enzymes were negative.  Symptoms resolved with GI cocktail.  He followed up with Wallis Bamberg, NP and symptoms had resolved.  Today, his main complaint is that he is constantly feeling cold and has persistent nasal drip. He states that he is feeling far more chilled than he has felt in the past, often requiring 2 layers of clothing despite the Summer weather. He wonders if his regimen needs to be changed, which we discussed at length. He continues to take famotidine once in the morning and once before bed time. No issues with chest pain recently. For activity, he makes sure to walk and move throughout the day. Continues to mow his lawn. No anginal  symptoms. Regarding his diet, he plans to work on decreasing his snacking at night. He has noticed some weight gain as he is normally closer to 162 lbs (168 lbs in the office today). Normally has decent meals, cooks at home twice a week. Opts for healthier choices when ordering out. Never adds salt to his meals. He denies any palpitations, shortness of breath, peripheral edema, lightheadedness, headaches, syncope, orthopnea, or PND.  ROS:  Please see the history of present illness. All other systems are reviewed and negative.  (+) Persistent chills/feeling cold (+) Nasal drip (+) Weight gain  Studies Reviewed: .        Risk Assessment/Calculations:             Physical Exam:    VS:  BP 125/65 (BP Location: Right Arm, Patient Position: Sitting, Cuff Size: Normal)   Pulse 66   Ht 5\' 7"  (1.702 m)   Wt 168 lb 14.4 oz (76.6 kg)   SpO2 96%   BMI 26.45 kg/m  , BMI Body mass index is 26.45 kg/m. GENERAL:  Well appearing HEENT: Pupils equal round and reactive, fundi not visualized, oral mucosa unremarkable NECK:  No jugular venous distention, waveform within normal limits, carotid upstroke brisk and symmetric, no bruits, no thyromegaly LUNGS:  Clear to auscultation bilaterally HEART:  RRR.  PMI not displaced or sustained,S1 and S2 within normal limits, no S3,  no S4, no clicks, no rubs, no murmurs ABD:  Flat, positive bowel sounds normal in frequency in pitch, no bruits, no rebound, no guarding, no midline pulsatile mass, no hepatomegaly, no splenomegaly EXT:  2 plus pulses throughout, no edema, no cyanosis no clubbing SKIN:  No rashes no nodules NEURO:  Cranial nerves II through XII grossly intact, motor grossly intact throughout PSYCH:  Cognitively intact, oriented to person place and time  Wt Readings from Last 3 Encounters:  08/03/23 168 lb 14.4 oz (76.6 kg)  04/03/23 163 lb 3.2 oz (74 kg)  02/28/23 164 lb (74.4 kg)     ASSESSMENT AND PLAN: .    # Cold Intolerance Likely  secondary to aspirin use and age. Discussed the risk/benefit of continuing aspirin despite this side effect. -Continue aspirin.   # CAD s/p CABG:  # Chest Pain/GERD Recent ED visit for chest pain, diagnosed as heartburn. Symptoms improved with famotidine.  He has no exertional symptoms.  -Continue famotidine BID. -Continue aspirin, metoprolol and rosuvastatin.   # Hyperlipidemia:  Lipids are well-controlled on rosuvastatin  # Physical Activity Patient is active with daily walking and yard work, but lacks structured exercise. -Encouraged to join a program at [employer] for structured exercise and diet education. (PREP)  # Diet Recent weight gain. Patient reports generally healthy eating habits but struggles with late-night snacking. -Encouraged to plan healthier snack options and continue reading food labels, particularly for sodium and sugar content.  Follow-up in 6 months. If any issues arise before then, patient to contact the office.     Dispo:  FU with Mylez Venable C. Duke Salvia, MD, [facility] in 6 months.  I,Mathew Stumpf,acting as a Neurosurgeon for Chilton Si, MD.,have documented all relevant documentation on the behalf of Chilton Si, MD,as directed by  Chilton Si, MD while in the presence of Chilton Si, MD.  I, Ravin Denardo C. Duke Salvia, MD have reviewed all documentation for this visit.  The documentation of the exam, diagnosis, procedures, and orders on 08/03/2023 are all accurate and complete.   Signed, Chilton Si, MD

## 2023-08-04 ENCOUNTER — Telehealth: Payer: Self-pay

## 2023-08-04 NOTE — Patient Instructions (Signed)

## 2023-08-04 NOTE — Telephone Encounter (Signed)
VMT pt requesting call back reference PREP referral.   

## 2023-08-08 ENCOUNTER — Ambulatory Visit: Payer: Medicare PPO | Admitting: Internal Medicine

## 2023-08-08 ENCOUNTER — Encounter: Payer: Self-pay | Admitting: Internal Medicine

## 2023-08-08 VITALS — BP 102/60 | HR 57 | Temp 98.6°F | Ht 67.0 in | Wt 162.6 lb

## 2023-08-08 DIAGNOSIS — I1310 Hypertensive heart and chronic kidney disease without heart failure, with stage 1 through stage 4 chronic kidney disease, or unspecified chronic kidney disease: Secondary | ICD-10-CM | POA: Diagnosis not present

## 2023-08-08 DIAGNOSIS — N1831 Chronic kidney disease, stage 3a: Secondary | ICD-10-CM

## 2023-08-08 DIAGNOSIS — Z7984 Long term (current) use of oral hypoglycemic drugs: Secondary | ICD-10-CM

## 2023-08-08 DIAGNOSIS — E663 Overweight: Secondary | ICD-10-CM | POA: Diagnosis not present

## 2023-08-08 DIAGNOSIS — Z6825 Body mass index (BMI) 25.0-25.9, adult: Secondary | ICD-10-CM | POA: Diagnosis not present

## 2023-08-08 DIAGNOSIS — I2510 Atherosclerotic heart disease of native coronary artery without angina pectoris: Secondary | ICD-10-CM | POA: Diagnosis not present

## 2023-08-08 DIAGNOSIS — Z7985 Long-term (current) use of injectable non-insulin antidiabetic drugs: Secondary | ICD-10-CM

## 2023-08-08 DIAGNOSIS — M79641 Pain in right hand: Secondary | ICD-10-CM

## 2023-08-08 DIAGNOSIS — E1122 Type 2 diabetes mellitus with diabetic chronic kidney disease: Secondary | ICD-10-CM

## 2023-08-08 DIAGNOSIS — M79642 Pain in left hand: Secondary | ICD-10-CM

## 2023-08-08 NOTE — Progress Notes (Unsigned)
I,Victoria T Deloria Lair, CMA,acting as a Neurosurgeon for [name], MD.,have documented all relevant documentation on the behalf of [name], MD,as directed by  [name], MD while in the presence of [name], MD.  Subjective:  Patient ID: Jose Ponce , male    DOB: Apr 21, 1941 , 82 y.o.   MRN: [id number]  Chief Complaint  Patient presents with  . Hypertension  . Diabetes    HPI  Pt is here for BP/DM follow up. He has not had any problems with the medication. Denies headache, chest pain, and SOB.  He reports having some joint pain in both of his hands. He thinks that it may be arthritis.  Hypertension This is a chronic problem. The current episode started more than 1 year ago. The problem has been gradually improving since onset. Pertinent negatives include no blurred vision or headaches. The current treatment provides moderate improvement. Compliance problems include exercise.  Hypertensive end-organ damage includes kidney disease and CAD/MI.  Diabetes He presents for his follow-up diabetic visit. He has type 2 diabetes mellitus. His disease course has been stable. There are no hypoglycemic associated symptoms. Pertinent negatives for hypoglycemia include no dizziness or headaches. Pertinent negatives for diabetes include no blurred vision, no fatigue, no polydipsia, no polyphagia and no polyuria. There are no hypoglycemic complications. Diabetic complications include nephropathy. Risk factors for coronary artery disease include diabetes mellitus, dyslipidemia, hypertension and male sex. He is following a diabetic diet. He participates in exercise three times a week. An ACE inhibitor/angiotensin II receptor blocker is being taken.     Past Medical History:  Diagnosis Date  . Chronic kidney disease   . Coronary artery disease   . Gout   . Hyperlipidemia   . Hypertension   . Prostate cancer (HCC)   . Type 2 diabetes mellitus (HCC)      Family History  Problem  Relation Age of Onset  . Asthma Mother   . Heart disease Father   . Stroke Father   . Heart attack Father 25  . Cancer Sister 58  . Diabetes Brother   . Heart disease Maternal Grandmother      Current Outpatient Medications:  .  allopurinol (ZYLOPRIM) 100 MG tablet, TAKE 1/2 TABLET BY MOUTH EVERY DAY, Disp: 45 tablet, Rfl: 1 .  aspirin 81 MG tablet, Take 81 mg by mouth daily., Disp: , Rfl:  .  Cholecalciferol 125 MCG (5000 UT) CHEW, Chew 5,000 Units by mouth. , Disp: , Rfl:  .  dapagliflozin propanediol (FARXIGA) 10 MG TABS tablet, Take 1 tablet (10 mg total) by mouth daily before breakfast., Disp: 30 tablet, Rfl: 2 .  Diclofenac Sodium 3 % GEL, Place onto the skin., Disp: , Rfl:  .  famotidine (PEPCID) 20 MG tablet, TAKE 1 TABLET BY MOUTH twice daily, Disp: 180 tablet, Rfl: 1 .  Flaxseed, Linseed, (FLAX SEEDS PO), Take 1 tablet by mouth daily., Disp: , Rfl:  .  ketoconazole (NIZORAL) 2 % cream, Apply 1 application topically daily., Disp: , Rfl:  .  lidocaine (LIDODERM) 5 %, Place 1 patch onto the skin daily. Remove & Discard patch within 12 hours or as directed by MD Place over left lower back., Disp: 30 patch, Rfl: 0 .  linaclotide (LINZESS) 145 MCG CAPS capsule, Take 145 mcg by mouth daily before breakfast., Disp: , Rfl:  .  metoprolol succinate (TOPROL-XL) 50 MG 24 hr tablet, TAKE 1 TABLET BY MOUTH EVERY DAY, Disp: 90 tablet, Rfl:  1 .  rosuvastatin (CRESTOR) 10 MG tablet, TAKE 1 TABLET BY MOUTH EVERY DAY, Disp: 90 tablet, Rfl: 1 .  Semaglutide,0.25 or 0.5MG /DOS, (OZEMPIC, 0.25 OR 0.5 MG/DOSE,) 2 MG/3ML SOPN, Inject 0.25 mg into the skin once a week., Disp: 6 mL, Rfl: 1 .  sildenafil (VIAGRA) 50 MG tablet, Take 1 tablet (50 mg total) by mouth daily as needed for erectile dysfunction., Disp: 30 tablet, Rfl: 0 .  telmisartan (MICARDIS) 20 MG tablet, TAKE 1 TABLET BY MOUTH EVERY DAY, Disp: 90 tablet, Rfl: 1 .  triamcinolone cream (KENALOG) 0.1 %, APPLY TO AFFECTED AREA on hand TWICE DAILY  AS NEEDED, Disp: 45 g, Rfl: 0  Current Facility-Administered Medications:  .  cyanocobalamin ((VITAMIN B-12)) injection 1,000 mcg, 1,000 mcg, Intramuscular, Once, Dorothyann Peng, MD   No Known Allergies   Review of Systems  Constitutional: Negative.  Negative for fatigue.  HENT: Negative.    Eyes: Negative.  Negative for blurred vision.  Respiratory: Negative.    Cardiovascular: Negative.   Gastrointestinal: Negative.   Endocrine: Negative for polydipsia, polyphagia and polyuria.  Skin: Negative.   Neurological:  Negative for dizziness and headaches.     Today's Vitals   08/08/23 1406  BP: 102/60  Pulse: (!) 57  Temp: 98.6 F (37 C)  SpO2: 97%  Weight: 162 lb 9.6 oz (73.8 kg)  Height: 5\' 7"  (1.702 m)   Body mass index is 25.47 kg/m.  Wt Readings from Last 3 Encounters:  08/08/23 162 lb 9.6 oz (73.8 kg)  08/03/23 168 lb 14.4 oz (76.6 kg)  04/03/23 163 lb 3.2 oz (74 kg)     Objective:  Physical Exam Vitals and nursing note reviewed.  Constitutional:      Appearance: Normal appearance.  HENT:     Head: Normocephalic and atraumatic.  Eyes:     Extraocular Movements: Extraocular movements intact.  Cardiovascular:     Rate and Rhythm: Normal rate and regular rhythm.     Heart sounds: Normal heart sounds.  Pulmonary:     Effort: Pulmonary effort is normal.     Breath sounds: Normal breath sounds.  Musculoskeletal:     Cervical back: Normal range of motion.  Skin:    General: Skin is warm.  Neurological:     General: No focal deficit present.     Mental Status: He is alert.  Psychiatric:        Mood and Affect: Mood normal.        Assessment And Plan:  Benign hypertensive heart and renal disease  Coronary artery disease involving native coronary artery of native heart without angina pectoris  Type 2 diabetes mellitus with stage 3a chronic kidney disease, without long-term current use of insulin (HCC) -     Hemoglobin A1c -     BMP8+EGFR  Overweight  with body mass index (BMI) of 25 to 25.9 in adult  Pain in both hands -     ANA, IFA (with reflex) -     CYCLIC CITRUL PEPTIDE ANTIBODY, IGG/IGA -     Rheumatoid factor -     Sedimentation rate -     Uric acid     Return for 4 month DM/HTN f/u.  Patient was given opportunity to ask questions. Patient verbalized understanding of the plan and was able to repeat key elements of the plan. All questions were answered to their satisfaction.    I, [name], MD, have reviewed all documentation for this visit. The documentation on 08/08/23  for the exam, diagnosis, procedures, and orders are all accurate and complete.   IF YOU HAVE BEEN REFERRED TO A SPECIALIST, IT MAY TAKE 1-2 WEEKS TO SCHEDULE/PROCESS THE REFERRAL. IF YOU HAVE NOT HEARD FROM US/SPECIALIST IN TWO WEEKS, PLEASE GIVE [place] A CALL AT [phone].   THE PATIENT IS ENCOURAGED TO PRACTICE SOCIAL DISTANCING DUE TO THE COVID-19 PANDEMIC.

## 2023-08-10 DIAGNOSIS — M79641 Pain in right hand: Secondary | ICD-10-CM | POA: Insufficient documentation

## 2023-08-10 NOTE — Assessment & Plan Note (Signed)
He is encouraged to aim for at least 30 minutes of exercise five days weekly.

## 2023-08-10 NOTE — Assessment & Plan Note (Signed)
Squeeze test is neg b/l.  I will check arthritis panel. May use Tylenol prn.

## 2023-08-10 NOTE — Assessment & Plan Note (Addendum)
Chronic, currently on weekly semaglutide and Farxiga. Pt advised to cut out intake of sugary drinks - he admits to drinking strawberry lemonade and other juices frequently. If A1c continues to rise, will need to start insulin. Chronic, he is encouraged to stay well hydrated, avoid NSAIDs and keep BP controlled to prevent progression of CKD.

## 2023-08-10 NOTE — Assessment & Plan Note (Signed)
Chronic, well controlled.   He will continue with metoprolol XL 50mg  daily and telmisartan 20mg  daily. Encouraged to follow low sodium diet.

## 2023-08-10 NOTE — Assessment & Plan Note (Signed)
Chronic, he is s/p CABG in 2005.  LDL goal is less than 70. Encouraged to continue with ASA, Bblocker and statin therapy. Cardiology input is appreciated.

## 2023-08-11 LAB — BMP8+EGFR
BUN/Creatinine Ratio: 11 (ref 10–24)
BUN: 18 mg/dL (ref 8–27)
CO2: 22 mmol/L (ref 20–29)
Calcium: 9.7 mg/dL (ref 8.6–10.2)
Chloride: 103 mmol/L (ref 96–106)
Creatinine, Ser: 1.62 mg/dL — ABNORMAL HIGH (ref 0.76–1.27)
Glucose: 182 mg/dL — ABNORMAL HIGH (ref 70–99)
Potassium: 5.1 mmol/L (ref 3.5–5.2)
Sodium: 141 mmol/L (ref 134–144)
eGFR: 42 mL/min/{1.73_m2} — ABNORMAL LOW (ref >59)

## 2023-08-11 LAB — RHEUMATOID FACTOR: Rheumatoid fact SerPl-aCnc: <10 IU/mL (ref <14.0)

## 2023-08-11 LAB — SEDIMENTATION RATE: Sed Rate: 11 mm/hr (ref 0–30)

## 2023-08-11 LAB — HEMOGLOBIN A1C
Est. average glucose Bld gHb Est-mCnc: 203 mg/dL
Hgb A1c MFr Bld: 8.7 % — ABNORMAL HIGH (ref 4.8–5.6)

## 2023-08-11 LAB — ANTINUCLEAR ANTIBODIES, IFA: ANA Titer 1: NEGATIVE

## 2023-08-11 LAB — CYCLIC CITRUL PEPTIDE ANTIBODY, IGG/IGA: Cyclic Citrullin Peptide Ab: 6 units (ref 0–19)

## 2023-08-11 LAB — URIC ACID: Uric Acid: 6.3 mg/dL (ref 3.8–8.4)

## 2023-08-16 ENCOUNTER — Telehealth: Payer: Self-pay

## 2023-08-16 ENCOUNTER — Other Ambulatory Visit: Payer: Self-pay | Admitting: Internal Medicine

## 2023-08-16 DIAGNOSIS — I2510 Atherosclerotic heart disease of native coronary artery without angina pectoris: Secondary | ICD-10-CM

## 2023-08-16 DIAGNOSIS — N1831 Chronic kidney disease, stage 3a: Secondary | ICD-10-CM

## 2023-08-16 DIAGNOSIS — I1310 Hypertensive heart and chronic kidney disease without heart failure, with stage 1 through stage 4 chronic kidney disease, or unspecified chronic kidney disease: Secondary | ICD-10-CM

## 2023-08-16 NOTE — Progress Notes (Signed)
Care Guide Note  08/16/2023 Name: ARMAAN RICHEL MRN: [id number] DOB: 13-Jul-1941  Referred by: Dorothyann Peng, MD Reason for referral : Care Coordination (Outreach to schedule with Pharm d )   DERONTE RINDLISBACHER is a 82 y.o. year old male who is a primary care patient of Dorothyann Peng, MD. [name] was referred to the pharmacist for assistance related to CAD, HTN, and DM.    Successful contact was made with the patient to discuss pharmacy services including being ready for the pharmacist to call at least 5 minutes before the scheduled appointment time, to have medication bottles and any blood sugar or blood pressure readings ready for review. The patient agreed to meet with the pharmacist via with the pharmacist via telephone visit on (date/time).  08/21/2023  Penne Lash, RMA Care Guide [facility]  [place], [place] [address] Direct Dial: [phone] [email or web address]

## 2023-08-21 ENCOUNTER — Other Ambulatory Visit: Payer: Medicare PPO | Admitting: Pharmacist

## 2023-08-21 ENCOUNTER — Other Ambulatory Visit (HOSPITAL_COMMUNITY): Payer: Self-pay

## 2023-08-21 ENCOUNTER — Telehealth: Payer: Self-pay

## 2023-08-21 MED ORDER — TELMISARTAN 20 MG PO TABS: 20.0000 mg | ORAL_TABLET | Freq: Every day | ORAL | 1 refills | Status: DC

## 2023-08-21 NOTE — Progress Notes (Signed)
08/21/2023 Name: Jose Ponce MRN: [id number] DOB: 1941-08-30  Chief Complaint  Patient presents with   Medication Management   Diabetes    Jose Ponce is a 82 y.o. year old male who presented for a telephone visit.   They were referred to the pharmacist by their PCP for assistance in managing diabetes and medication access.    Subjective:  Care Team: Primary Care Provider: Dorothyann Peng, MD ; Next Scheduled Visit: 10/12/23  Medication Access/Adherence  Current Pharmacy:  [facility] [phone] - [place], [place] - [address] [place] [address] Phone: [phone] Fax: [phone]   Patient reports affordability concerns with their medications: Yes  Patient reports access/transportation concerns to their pharmacy: No  Patient reports adherence concerns with their medications:  No     Diabetes:  Current medications: Farxiga 10 mg daily, Ozempic 0.25 mg weekly  Current glucose readings: 90s-100s; reports he checks his sugar when he feels that it is high, feels like if he drinks vinegar or coke it will come down  Patient denies hypoglycemic s/sx including dizziness, shakiness, sweating. Patient denies hyperglycemic symptoms including polyuria, polydipsia, polyphagia, nocturia, neuropathy, blurred vision.  Current meal patterns: has cut out lemonade, peach tea since his last appointment. Mostly water now.   Current medication access support: was previously approved for Ozempic assistance  Hypertension:  Current medications: telmisartan 20 mg daily, metoprolol succinate 50 mg daily  Hyperlipidemia/ASCVD Risk Reduction  Current lipid lowering medications: rosuvastatin 10 mg daily  Antiplatelet regimen: aspirin 81 mg daily   Objective:  Lab Results  Component Value Date   HGBA1C 8.7 (H) 08/08/2023    Lab Results  Component Value Date   CREATININE 1.62 (H) 08/08/2023   BUN 18  08/08/2023   NA 141 08/08/2023   K 5.1 08/08/2023   CL 103 08/08/2023   CO2 22 08/08/2023    Lab Results  Component Value Date   CHOL 144 04/03/2023   HDL 58 04/03/2023   LDLCALC 70 04/03/2023   TRIG 84 04/03/2023   CHOLHDL 2.5 04/03/2023    Medications Reviewed Today     Reviewed by Alden Hipp, RPH-CPP (Pharmacist) on 08/21/23 at 1002  Med List Status: <None>   Medication Order Taking? Sig Documenting Provider Last Dose Status Informant  allopurinol (ZYLOPRIM) 100 MG tablet [id number] Yes TAKE 1/2 TABLET BY MOUTH EVERY DAY Dorothyann Peng, MD Taking Active   aspirin 81 MG tablet [id number] Yes Take 81 mg by mouth daily. [provider] Taking Active   Cholecalciferol 125 MCG (5000 UT) CHEW [id number] Yes Chew 5,000 Units by mouth.  [provider] Taking Active   cyanocobalamin ((VITAMIN B-12)) injection 1,000 mcg [id number]   Dorothyann Peng, MD  Active   dapagliflozin propanediol (FARXIGA) 10 MG TABS tablet [id number] Yes Take 1 tablet (10 mg total) by mouth daily before breakfast. Dorothyann Peng, MD Taking Active   famotidine (PEPCID) 20 MG tablet [id number] Yes TAKE 1 TABLET BY MOUTH twice daily Dorothyann Peng, MD Taking Active   Flaxseed, Linseed, (FLAX SEEDS PO) [id number] Yes Take 1 tablet by mouth daily. [provider] Taking Active   linaclotide Karlene Einstein) 145 MCG CAPS capsule [id number] No Take 145 mcg by mouth daily before breakfast.  Patient not taking: Reported on 08/21/2023   [provider] Not Taking Active   metoprolol succinate (TOPROL-XL) 50 MG 24 hr tablet [id number] Yes TAKE 1 TABLET BY MOUTH EVERY  Sharyon Medicus, MD Taking Active   rosuvastatin (CRESTOR) 10 MG tablet [id number] Yes TAKE 1 TABLET BY MOUTH EVERY DAY Dorothyann Peng, MD Taking Active   Semaglutide,0.25 or 0.5MG /DOS, (OZEMPIC, 0.25 OR 0.5 MG/DOSE,) 2 MG/3ML SOPN [id number]  Inject 0.25 mg into the skin once a week. Dorothyann Peng, MD  Active   telmisartan (MICARDIS)  20 MG tablet [id number] Yes TAKE 1 TABLET BY MOUTH EVERY DAY Dorothyann Peng, MD Taking Active   triamcinolone cream (KENALOG) 0.1 % [id number] No APPLY TO AFFECTED AREA on hand TWICE DAILY AS NEEDED  Patient not taking: Reported on 08/21/2023   Dorothyann Peng, MD Not Taking Active               Assessment/Plan:   Diabetes: - Currently uncontrolled - Reviewed long term cardiovascular and renal outcomes of uncontrolled blood sugar - Reviewed goal A1c, goal fasting, and goal 2 hour post prandial glucose - Reviewed dietary modifications including: praised for reduction in sugary beverages  - Recommend to increase Ozempic. Patient declines, he would like to evaluate impact of dietary changes first. Discussed adding in 2 hour post prandial sugars. If blood sugar readings are not at goal in 4 weeks, will discuss Ozempic increase then, instead of waiting until November A1c. Reviewed importance of glucose control as it relates to kidney health.  - Recommend to check glucose twice daily, fasting and 2 hour post prandial - Meets financial criteria for [place] and Ozempic patient assistance program through [employer]. Will collaborate with provider, CPhT, and patient to pursue assistance.   Hypertension: - Currently controlled - Recommend to continue current regimen at this time   Hyperlipidemia/ASCVD Risk Reduction: - Currently controlled.  - Recommend to continue current regimen at this time   Follow Up Plan: phone call in 4 weeks  Catie TClearance Coots, PharmD, BCACP, CPP Clinical Pharmacist [facility] [phone]

## 2023-08-21 NOTE — Telephone Encounter (Signed)
PAP: PAP application for Farxiga, Publishing rights manager ([employer])) has been mailed to pt's home address on file. Will fax provider portion of application to provider's office when pt's portion is received.  PAP: Application for Ozempic has been submitted to PAP Companies: [employer], Therapist, sports

## 2023-08-21 NOTE — Patient Instructions (Signed)
Jose Ponce,   It was great talking to you today!  We will mail the applications for Ozempic and [place].   Check your blood sugars twice daily:  1) Fasting, first thing in the morning before breakfast and  2) 2 hours after your largest meal.   For a goal A1c of less than 7%, goal fasting readings are less than 130 and goal 2 hour after meal readings are less than 180.    If your readings are not at goal at the time of our next call, I recommend we increase Ozempic to 0.5 mg weekly.   Please reach out with any questions!  Catie Eppie Gibson, PharmD, BCACP, CPP Clinical Pharmacist [facility] [phone]

## 2023-08-21 NOTE — Telephone Encounter (Signed)
-----   Message from [employer] sent at 08/21/2023 10:26 AM EDT ----- Hi team! Please start Ozempic 0.5 mg (can delay for 2 weeks if easier to wait for 2025), mail Farxiga 10 mg app. Let me know when [place] approved and I can send script to [facility].   Thanks! Catie

## 2023-08-29 ENCOUNTER — Other Ambulatory Visit: Payer: Self-pay

## 2023-08-29 MED ORDER — FAMOTIDINE 20 MG PO TABS: ORAL_TABLET | ORAL | 1 refills | Status: DC

## 2023-08-30 NOTE — Telephone Encounter (Signed)
PAP: Patient assistance application for Ozempic has been approved by PAP Companies: [employer] from 08/24/2023 to 11/21/2023. Medication should be delivered to PAP Delivery: Provider's office For further shipping updates, please contact [employer] at [phone] Pt ID is: [id number]

## 2023-09-10 ENCOUNTER — Other Ambulatory Visit: Payer: Self-pay | Admitting: Internal Medicine

## 2023-09-12 DIAGNOSIS — H2513 Age-related nuclear cataract, bilateral: Secondary | ICD-10-CM | POA: Diagnosis not present

## 2023-09-12 DIAGNOSIS — H43813 Vitreous degeneration, bilateral: Secondary | ICD-10-CM | POA: Diagnosis not present

## 2023-09-12 DIAGNOSIS — E113292 Type 2 diabetes mellitus with mild nonproliferative diabetic retinopathy without macular edema, left eye: Secondary | ICD-10-CM | POA: Diagnosis not present

## 2023-09-12 LAB — HM DIABETES EYE EXAM

## 2023-09-18 ENCOUNTER — Other Ambulatory Visit (INDEPENDENT_AMBULATORY_CARE_PROVIDER_SITE_OTHER): Payer: Medicare PPO | Admitting: Pharmacist

## 2023-09-18 DIAGNOSIS — E1122 Type 2 diabetes mellitus with diabetic chronic kidney disease: Secondary | ICD-10-CM

## 2023-09-18 DIAGNOSIS — N1831 Chronic kidney disease, stage 3a: Secondary | ICD-10-CM

## 2023-09-18 NOTE — Progress Notes (Signed)
09/18/2023 Name: Jose Ponce MRN: [id number] DOB: Mar 23, 1941  Chief Complaint  Patient presents with   Medication Management   Diabetes    Jose Ponce is a 82 y.o. year old male who presented for a telephone visit.   They were referred to the pharmacist by their PCP for assistance in managing diabetes.    Subjective:  Care Team: Primary Care Provider: Dorothyann Peng, MD ; Next Scheduled Visit: 10/12/23  Medication Access/Adherence  Current Pharmacy:  [facility] [phone] - [place], [place] - [address] AT [facility] [address] [place] [address] Phone: [phone] Fax: [phone]   Patient reports affordability concerns with their medications: Yes  Patient reports access/transportation concerns to their pharmacy: No  Patient reports adherence concerns with their medications:  No     Diabetes:   Current medications: Farxiga 10 mg daily, Ozempic 0.25 mg weekly   Previously resistant to increasing Ozempic until he sees the next A1c.   Current medication access support: approved for Ozempic assistance, needing to submit patient portion for Farxiga   Hypertension:   Current medications: telmisartan 20 mg daily, metoprolol succinate 50 mg daily   Hyperlipidemia/ASCVD Risk Reduction   Current lipid lowering medications: rosuvastatin 10 mg daily   Antiplatelet regimen: aspirin 81 mg daily   Objective:  Lab Results  Component Value Date   HGBA1C 8.7 (H) 08/08/2023    Lab Results  Component Value Date   CREATININE 1.62 (H) 08/08/2023   BUN 18 08/08/2023   NA 141 08/08/2023   K 5.1 08/08/2023   CL 103 08/08/2023   CO2 22 08/08/2023    Lab Results  Component Value Date   CHOL 144 04/03/2023   HDL 58 04/03/2023   LDLCALC 70 04/03/2023   TRIG 84 04/03/2023   CHOLHDL 2.5 04/03/2023    Medications Reviewed Today     Reviewed by Alden Hipp, RPH-CPP (Pharmacist) on 09/18/23 at 1033  Med  List Status: <None>   Medication Order Taking? Sig Documenting Provider Last Dose Status Informant  allopurinol (ZYLOPRIM) 100 MG tablet [id number] Yes TAKE 1/2 TABLET BY MOUTH EVERY DAY Dorothyann Peng, MD Taking Active   aspirin 81 MG tablet [id number] Yes Take 81 mg by mouth daily. [provider] Taking Active   Cholecalciferol 125 MCG (5000 UT) CHEW [id number] Yes Chew 5,000 Units by mouth.  [provider] Taking Active   cyanocobalamin ((VITAMIN B-12)) injection 1,000 mcg [id number]   Dorothyann Peng, MD  Active   dapagliflozin propanediol (FARXIGA) 10 MG TABS tablet [id number] Yes Take 1 tablet (10 mg total) by mouth daily before breakfast. Dorothyann Peng, MD Taking Active   famotidine (PEPCID) 20 MG tablet [id number] Yes TAKE 1 TABLET BY MOUTH twice daily Dorothyann Peng, MD Taking Active   Flaxseed, Linseed, (FLAX SEEDS PO) [id number] Yes Take 1 tablet by mouth daily. [provider] Taking Active   linaclotide Karlene Einstein) 145 MCG CAPS capsule [id number] No Take 145 mcg by mouth daily before breakfast.  Patient not taking: Reported on 08/21/2023   [provider] Not Taking Active   metoprolol succinate (TOPROL-XL) 50 MG 24 hr tablet [id number] Yes TAKE 1 TABLET BY MOUTH EVERY DAY Dorothyann Peng, MD Taking Active   rosuvastatin (CRESTOR) 10 MG tablet [id number] Yes TAKE 1 TABLET BY MOUTH EVERY DAY Dorothyann Peng, MD Taking Active   Semaglutide,0.25 or 0.5MG /DOS, (OZEMPIC, 0.25 OR 0.5 MG/DOSE,) 2 MG/3ML SOPN [id number] Yes Inject 0.25 mg into  the skin once a week. Dorothyann Peng, MD Taking Active   telmisartan (MICARDIS) 20 MG tablet [id number] Yes Take 1 tablet (20 mg total) by mouth daily. Dorothyann Peng, MD Taking Active   triamcinolone cream (KENALOG) 0.1 % [id number]  APPLY TO AFFECTED AREA on hand TWICE DAILY AS NEEDED  Patient not taking: Reported on 08/21/2023   Dorothyann Peng, MD  Active               Assessment/Plan:   Diabetes: - Currently  uncontrolled - Approved for Ozempic assistance, awaiting first shipment. Patient portion of Farxiga received today, will pass to CPhT team [place].     Follow Up Plan: phone call in ~ 6 weeks  Catie TClearance Coots, PharmD, BCACP, CPP Clinical Pharmacist [facility] [phone]

## 2023-10-12 ENCOUNTER — Ambulatory Visit: Payer: Medicare PPO | Admitting: Internal Medicine

## 2023-10-12 ENCOUNTER — Encounter: Payer: Self-pay | Admitting: Internal Medicine

## 2023-10-12 VITALS — BP 110/70 | HR 68 | Temp 97.4°F | Ht 67.0 in | Wt 162.2 lb

## 2023-10-12 DIAGNOSIS — I25118 Atherosclerotic heart disease of native coronary artery with other forms of angina pectoris: Secondary | ICD-10-CM

## 2023-10-12 DIAGNOSIS — M7989 Other specified soft tissue disorders: Secondary | ICD-10-CM

## 2023-10-12 DIAGNOSIS — I1310 Hypertensive heart and chronic kidney disease without heart failure, with stage 1 through stage 4 chronic kidney disease, or unspecified chronic kidney disease: Secondary | ICD-10-CM | POA: Diagnosis not present

## 2023-10-12 DIAGNOSIS — N1831 Chronic kidney disease, stage 3a: Secondary | ICD-10-CM

## 2023-10-12 DIAGNOSIS — E1122 Type 2 diabetes mellitus with diabetic chronic kidney disease: Secondary | ICD-10-CM

## 2023-10-12 NOTE — Assessment & Plan Note (Signed)
He declines Hand specialty referral at this time.

## 2023-10-12 NOTE — Assessment & Plan Note (Signed)
Chronic, he is s/p CABG in 2005.  LDL goal is less than 70. Encouraged to continue with ASA, Bblocker and statin therapy. Cardiology input is appreciated.

## 2023-10-12 NOTE — Assessment & Plan Note (Addendum)
Chronic, currently on weekly semaglutide and Farxiga. He is aware that if A1c continues to rise, he will need to start insulin. CKD is chronic, he is encouraged to stay well hydrated, avoid NSAIDs and keep BP controlled to prevent progression of CKD.

## 2023-10-12 NOTE — Progress Notes (Signed)
I,Victoria T Deloria Lair, CMA,acting as a Neurosurgeon for [name], MD.,have documented all relevant documentation on the behalf of [name], MD,as directed by  [name], MD while in the presence of [name], MD.  Subjective:  Patient ID: Jose Ponce , male    DOB: Jul 27, 1941 , 82 y.o.   MRN: [id number]  Chief Complaint  Patient presents with   Diabetes   Hypertension    HPI  Pt is here today for diabetes, and blood pressure check. He reports compliance with meds. He denies headaches, chest pain and shortness of breath. He states he has cut out sugary drinks since his last visit. He is now using [employer].   Diabetes He presents for his follow-up diabetic visit. He has type 2 diabetes mellitus. His disease course has been stable. There are no hypoglycemic associated symptoms. Pertinent negatives for hypoglycemia include no dizziness or headaches. Pertinent negatives for diabetes include no blurred vision, no fatigue, no polydipsia, no polyphagia and no polyuria. There are no hypoglycemic complications. Diabetic complications include nephropathy. Risk factors for coronary artery disease include diabetes mellitus, dyslipidemia, hypertension and male sex. He is following a diabetic diet. He participates in exercise three times a week. An ACE inhibitor/angiotensin II receptor blocker is being taken.  Hypertension This is a chronic problem. The current episode started more than 1 year ago. The problem has been gradually improving since onset. Pertinent negatives include no blurred vision or headaches. The current treatment provides moderate improvement. Compliance problems include exercise.  Hypertensive end-organ damage includes kidney disease and CAD/MI.     Past Medical History:  Diagnosis Date   Chronic kidney disease    Coronary artery disease    Gout    Hyperlipidemia    Hypertension    Prostate cancer (HCC)    Type 2 diabetes mellitus (HCC)      Family  History  Problem Relation Age of Onset   Asthma Mother    Heart disease Father    Stroke Father    Heart attack Father 2   Cancer Sister 18   Diabetes Brother    Heart disease Maternal Grandmother      Current Outpatient Medications:    allopurinol (ZYLOPRIM) 100 MG tablet, TAKE 1/2 TABLET BY MOUTH EVERY DAY, Disp: 45 tablet, Rfl: 1   aspirin 81 MG tablet, Take 81 mg by mouth daily., Disp: , Rfl:    Cholecalciferol 125 MCG (5000 UT) CHEW, Chew 5,000 Units by mouth. , Disp: , Rfl:    dapagliflozin propanediol (FARXIGA) 10 MG TABS tablet, Take 1 tablet (10 mg total) by mouth daily before breakfast., Disp: 30 tablet, Rfl: 2   famotidine (PEPCID) 20 MG tablet, TAKE 1 TABLET BY MOUTH twice daily, Disp: 180 tablet, Rfl: 1   Flaxseed, Linseed, (FLAX SEEDS PO), Take 1 tablet by mouth daily., Disp: , Rfl:    metoprolol succinate (TOPROL-XL) 50 MG 24 hr tablet, TAKE 1 TABLET BY MOUTH EVERY DAY, Disp: 90 tablet, Rfl: 1   rosuvastatin (CRESTOR) 10 MG tablet, TAKE 1 TABLET BY MOUTH EVERY DAY, Disp: 90 tablet, Rfl: 1   Semaglutide,0.25 or 0.5MG /DOS, (OZEMPIC, 0.25 OR 0.5 MG/DOSE,) 2 MG/3ML SOPN, Inject 0.25 mg into the skin once a week., Disp: 6 mL, Rfl: 1   telmisartan (MICARDIS) 20 MG tablet, Take 1 tablet (20 mg total) by mouth daily., Disp: 90 tablet, Rfl: 1   linaclotide (LINZESS) 145 MCG CAPS capsule, Take 145 mcg by mouth daily before breakfast. (  Patient not taking: Reported on 08/21/2023), Disp: , Rfl:    triamcinolone cream (KENALOG) 0.1 %, APPLY TO AFFECTED AREA on hand TWICE DAILY AS NEEDED (Patient not taking: Reported on 08/21/2023), Disp: 45 g, Rfl: 0  Current Facility-Administered Medications:    cyanocobalamin ((VITAMIN B-12)) injection 1,000 mcg, 1,000 mcg, Intramuscular, Once, Dorothyann Peng, MD   No Known Allergies   Review of Systems  Constitutional: Negative.  Negative for fatigue.  HENT: Negative.    Eyes:  Negative for blurred vision.  Respiratory: Negative.     Cardiovascular: Negative.   Gastrointestinal: Negative.   Endocrine: Negative for polydipsia, polyphagia and polyuria.  Skin: Negative.        C/o mass on left hand. Not painful. Not sure what triggered sx.   Allergic/Immunologic: Negative.   Neurological: Negative.  Negative for dizziness and headaches.  Hematological: Negative.      Today's Vitals   10/12/23 1039  BP: 110/70  Pulse: 68  Temp: (!) 97.4 F (36.3 C)  SpO2: 98%  Weight: 162 lb 3.2 oz (73.6 kg)  Height: 5\' 7"  (1.702 m)   Body mass index is 25.4 kg/m.  Wt Readings from Last 3 Encounters:  10/12/23 162 lb 3.2 oz (73.6 kg)  08/08/23 162 lb 9.6 oz (73.8 kg)  08/03/23 168 lb 14.4 oz (76.6 kg)     Objective:  Physical Exam Vitals and nursing note reviewed.  Constitutional:      Appearance: Normal appearance. He is obese.  HENT:     Head: Normocephalic and atraumatic.  Eyes:     Extraocular Movements: Extraocular movements intact.  Cardiovascular:     Rate and Rhythm: Normal rate and regular rhythm.     Heart sounds: Normal heart sounds.  Pulmonary:     Effort: Pulmonary effort is normal.     Breath sounds: Normal breath sounds.  Musculoskeletal:     Cervical back: Normal range of motion.  Skin:    General: Skin is warm.     Comments: 1cm - Soft tissue mass palmar side of left hand, nontender  Neurological:     General: No focal deficit present.     Mental Status: He is alert.  Psychiatric:        Mood and Affect: Mood normal.         Assessment And Plan:  Type 2 diabetes mellitus with stage 3a chronic kidney disease, without long-term current use of insulin (HCC) Assessment & Plan: Chronic, currently on weekly semaglutide and Farxiga. He is aware that if A1c continues to rise, he will need to start insulin. CKD is chronic, he is encouraged to stay well hydrated, avoid NSAIDs and keep BP controlled to prevent progression of CKD.    Orders: -     Hemoglobin A1c  Benign hypertensive heart and  renal disease Assessment & Plan: Chronic, well controlled.   He will continue with metoprolol XL 50mg  daily and telmisartan 20mg  daily. Encouraged to follow low sodium diet.    Coronary artery disease involving native coronary artery of native heart with other form of angina pectoris [facility]) Assessment & Plan: Chronic, he is s/p CABG in 2005.  LDL goal is less than 70. Encouraged to continue with ASA, Bblocker and statin therapy. Cardiology input is appreciated.    Mass of soft tissue of hand Assessment & Plan: He declines Hand specialty referral at this time.       Return if symptoms worsen or fail to improve.  Patient was given opportunity to ask  questions. Patient verbalized understanding of the plan and was able to repeat key elements of the plan. All questions were answered to their satisfaction.    I, [name], MD, have reviewed all documentation for this visit. The documentation on 10/12/23 for the exam, diagnosis, procedures, and orders are all accurate and complete.   IF YOU HAVE BEEN REFERRED TO A SPECIALIST, IT MAY TAKE 1-2 WEEKS TO SCHEDULE/PROCESS THE REFERRAL. IF YOU HAVE NOT HEARD FROM US/SPECIALIST IN TWO WEEKS, PLEASE GIVE [place] A CALL AT [phone].   THE PATIENT IS ENCOURAGED TO PRACTICE SOCIAL DISTANCING DUE TO THE COVID-19 PANDEMIC.

## 2023-10-12 NOTE — Assessment & Plan Note (Signed)
Chronic, well controlled.   He will continue with metoprolol XL 50mg  daily and telmisartan 20mg  daily. Encouraged to follow low sodium diet.

## 2023-10-12 NOTE — Patient Instructions (Signed)

## 2023-10-12 NOTE — Assessment & Plan Note (Signed)
 Chronic, he is encouraged to stay well hydrated, avoid NSAIDs and keep BP controlled to prevent progression of CKD.

## 2023-10-13 LAB — HEMOGLOBIN A1C
Est. average glucose Bld gHb Est-mCnc: 183 mg/dL
Hgb A1c MFr Bld: 8 % — ABNORMAL HIGH (ref 4.8–5.6)

## 2023-10-23 ENCOUNTER — Other Ambulatory Visit: Payer: Self-pay | Admitting: Pharmacist

## 2023-10-23 NOTE — Progress Notes (Signed)
Pharmacy Medication Assistance Program Note    10/23/2023  Patient ID: Jose Ponce, male   DOB: 1941/07/04, 82 y.o.   MRN: [id number]     10/23/2023  Outreach Medication One  Initial Outreach Date (Medication One) 10/23/2023  Manufacturer Medication One [employer]  [employer] [facility] Farxiga  Dose of Farxiga 10 mg  Type of Assistance Manufacturer Assistance  Date Application Sent to Patient 10/23/2023  Application Items Requested Application  Date Application Sent to Prescriber 10/23/2023  Name of Prescriber Dorothyann Peng  Date Application Received From Patient 10/23/2023  Application Items Received From Patient Application  Date Application Received From Provider 10/23/2023  Date Application Submitted to Manufacturer 10/23/2023  Method Application Sent to Manufacturer Fax     Will complete Novo re-enrollment at next appointment  Catie Eppie Gibson, PharmD, BCACP, CPP Clinical Pharmacist [facility] [phone]

## 2023-10-24 ENCOUNTER — Telehealth: Payer: Self-pay

## 2023-10-24 NOTE — Telephone Encounter (Signed)
PAP: Patient has been denied for pt assistance by PAP Companies: [place] due to patient above income threshold for program   PT ID: [id number] FULL DENIAL LETTER ATTACHED IN MEDIA

## 2023-10-25 NOTE — Telephone Encounter (Addendum)
Disregard, I am taking care of it.   Thanks!

## 2023-10-25 NOTE — Progress Notes (Signed)
Care Coordination Call  Contacted [place] to [place]. Their income verification noted he had a 2 person household size. He has a 1 person household size and enclosed income puts him as eligible for [place] assistance. A letter documenting this has been sent to [place].   Catie Eppie Gibson, PharmD, BCACP, CPP Clinical Pharmacist [facility] [phone]

## 2023-11-06 ENCOUNTER — Other Ambulatory Visit: Payer: Medicare PPO | Admitting: Pharmacist

## 2023-11-06 DIAGNOSIS — N1831 Chronic kidney disease, stage 3a: Secondary | ICD-10-CM

## 2023-11-06 NOTE — Progress Notes (Signed)
11/06/2023 Name: Jose Ponce MRN: [id number] DOB: 07-Nov-1941  Chief Complaint  Patient presents with   Medication Management   Diabetes   Hypertension    Jose Ponce is a 82 y.o. year old male who presented for a telephone visit.   They were referred to the pharmacist by their PCP for assistance in managing diabetes, hypertension, and hyperlipidemia.    Subjective:  Care Team: Primary Care Provider: Dorothyann Peng, MD ; Next Scheduled Visit:   Medication Access/Adherence  Current Pharmacy:  [facility] [phone] - [place],  - [address] AT [facility] [address] [place] [address] Phone: [phone] Fax: [phone]   Patient reports affordability concerns with their medications: No  Patient reports access/transportation concerns to their pharmacy: No  Patient reports adherence concerns with their medications:  No     Diabetes:  Current medications: Ozempic 0.25 mg weekly, Farxiga 10 mg daily   Notes he still gets full quickly from Ozempic. Denies constipation, nausea.   Current glucose readings: fastings: 90-110s; not generally checking readings later in the day.    Patient denies hypoglycemic s/sx including dizziness, shakiness, sweating. Patient denies hyperglycemic symptoms including polyuria, polydipsia, polyphagia, nocturia, neuropathy, blurred vision.  Current meal patterns:  - Breakfast: boiled egg, oatmeal; 2 cups of black coffee - Lunch: generally doesn't eat - Supper: chicken; salmon; fish; small portion sizes of potatoes, overall smaller portion sizes - Snacks: sometimes pack of crackers;  - Drinks: has cut out lemonade - was drinking a variable amount previously   Current medication access support: working on 2025 Farxiga and Ozempic assistance   Hypertension:  Current medications: telmisartan 20 mg daily, metoprolol succinate 50 mg daily    Hyperlipidemia/ASCVD Risk  Reduction  Current lipid lowering medications: rosuvastatin 10 mg daily   Objective:  Lab Results  Component Value Date   HGBA1C 8.0 (H) 10/12/2023    Lab Results  Component Value Date   CREATININE 1.62 (H) 08/08/2023   BUN 18 08/08/2023   NA 141 08/08/2023   K 5.1 08/08/2023   CL 103 08/08/2023   CO2 22 08/08/2023    Lab Results  Component Value Date   CHOL 144 04/03/2023   HDL 58 04/03/2023   LDLCALC 70 04/03/2023   TRIG 84 04/03/2023   CHOLHDL 2.5 04/03/2023    Medications Reviewed Today     Reviewed by Alden Hipp, RPH-CPP (Pharmacist) on 11/06/23 at 1114  Med List Status: <None>   Medication Order Taking? Sig Documenting Provider Last Dose Status Informant  allopurinol (ZYLOPRIM) 100 MG tablet [id number] Yes TAKE 1/2 TABLET BY MOUTH EVERY DAY Dorothyann Peng, MD Taking Active   aspirin 81 MG tablet [id number] Yes Take 81 mg by mouth daily. [provider] Taking Active   Cholecalciferol 125 MCG (5000 UT) CHEW [id number] Yes Chew 5,000 Units by mouth.  [provider] Taking Active   cyanocobalamin ((VITAMIN B-12)) injection 1,000 mcg [id number]   Dorothyann Peng, MD  Active   dapagliflozin propanediol (FARXIGA) 10 MG TABS tablet [id number] Yes Take 1 tablet (10 mg total) by mouth daily before breakfast. Dorothyann Peng, MD Taking Active   famotidine (PEPCID) 20 MG tablet [id number] Yes TAKE 1 TABLET BY MOUTH twice daily Dorothyann Peng, MD Taking Active   Flaxseed, Linseed, (FLAX SEEDS PO) [id number] Yes Take 1 tablet by mouth daily. [provider] Taking Active   linaclotide Karlene Einstein) 145 MCG CAPS capsule [id number]  Take 145 mcg  by mouth daily before breakfast.  Patient not taking: Reported on 08/21/2023   [provider]  Active   metoprolol succinate (TOPROL-XL) 50 MG 24 hr tablet [id number] Yes TAKE 1 TABLET BY MOUTH EVERY DAY Dorothyann Peng, MD Taking Active   rosuvastatin (CRESTOR) 10 MG tablet [id number] Yes TAKE 1 TABLET BY MOUTH  EVERY DAY Dorothyann Peng, MD Taking Active   Semaglutide,0.25 or 0.5MG /DOS, (OZEMPIC, 0.25 OR 0.5 MG/DOSE,) 2 MG/3ML SOPN [id number] Yes Inject 0.25 mg into the skin once a week. Dorothyann Peng, MD Taking Active   telmisartan (MICARDIS) 20 MG tablet [id number] Yes Take 1 tablet (20 mg total) by mouth daily. Dorothyann Peng, MD Taking Active   triamcinolone cream (KENALOG) 0.1 % [id number]  APPLY TO AFFECTED AREA on hand TWICE DAILY AS NEEDED Dorothyann Peng, MD  Active               Assessment/Plan:   Diabetes: - Currently uncontrolled per A1c but improved per reported glucose data  - Reviewed long term cardiovascular and renal outcomes of uncontrolled blood sugar - Reviewed goal A1c, goal fasting, and goal 2 hour post prandial glucose - Would recommend increasing Ozempic to 0.5 mg weekly, but patient prefers to continue current regimen at this time.  - Contacted [employer] to follow up on status of [place] assistance application. They did not receive what was faxed 12/4. Will refax application + proof of income. Ozempic 2025 application submitted today.  - Recommend to check glucose alternating between fasting and 2 hour post prandial  Hypertension: - Currently controlled - Recommend to continue current regimen at this tine   Hyperlipidemia/ASCVD Risk Reduction: - Currently controlled.  - Recommend to continue current regimen at this time    Follow Up Plan: phone call in 8 weeks  Catie TClearance Coots, PharmD, BCACP, CPP Clinical Pharmacist [facility] [phone]

## 2023-11-12 ENCOUNTER — Other Ambulatory Visit: Payer: Self-pay | Admitting: Internal Medicine

## 2023-11-15 ENCOUNTER — Other Ambulatory Visit: Payer: Self-pay | Admitting: Internal Medicine

## 2023-12-01 ENCOUNTER — Telehealth: Payer: Self-pay

## 2023-12-01 ENCOUNTER — Encounter: Payer: Self-pay | Admitting: Pharmacist

## 2023-12-01 NOTE — Telephone Encounter (Signed)
 PAP: Patient assistance application Ozempic  for has been approved by PAP Companies: [employer] from 11/22/2023 to 11/16/2024. Medication should be delivered to PAP Delivery: Provider's office For further shipping updates, please contact [employer] at [phone] Pt ID is: per automated system

## 2023-12-05 NOTE — Telephone Encounter (Signed)
 Spoke with patient. He has been divorced for over 20 years. He does have a 2023 tax return that may help with enrollment. He will bring this by the office next Tuesday.   Please scan a copy and fax to Nancy pharmacologist) at [phone] to send to [employer].   Thanks!

## 2023-12-05 NOTE — Telephone Encounter (Addendum)
 Received fax from [place], pt was denied do to being over income, and will need to provide certificate of divorce to be processed as household size of 1. Denial letter has been indexed to media tab.  PT [id number]

## 2023-12-19 ENCOUNTER — Ambulatory Visit: Payer: Medicare PPO | Admitting: Internal Medicine

## 2024-01-01 ENCOUNTER — Other Ambulatory Visit: Payer: Self-pay

## 2024-01-15 ENCOUNTER — Other Ambulatory Visit: Payer: Self-pay | Admitting: Pharmacist

## 2024-01-15 ENCOUNTER — Encounter: Payer: Self-pay | Admitting: Pharmacist

## 2024-01-15 DIAGNOSIS — N1831 Chronic kidney disease, stage 3a: Secondary | ICD-10-CM

## 2024-01-15 NOTE — Progress Notes (Signed)
 01/15/2024 Name: Jose Ponce MRN: [id number] DOB: 1941-04-13  Chief Complaint  Patient presents with   Medication Management    Diabetes    Jose Ponce is a 83 y.o. year old male who presented for a telephone visit.   They were referred to the pharmacist by their PCP for assistance in managing diabetes.  (Follow up on Medication Access)   Subjective:  Care Team: Primary Care Provider: Dorothyann Peng, MD ; Next Scheduled Visit: 01/23/2024  Medication Access/Adherence  Current Pharmacy:  [facility] [phone] - [place], [place] - [address] AT [facility] [address] [place] [address] Phone: [phone] Fax: [phone]   Patient reports affordability concerns with their medications: Yes -Is receiving medication assistance for Farxiga and Ozempic Patient reports access/transportation concerns to their pharmacy: No  Patient reports adherence concerns with their medications:  No      Diabetes:  Current medications:  Farxiga 10 mg 1 tablet daily Ozempic 0.25 mg weekly  Patient denies hypoglycemic or hyperglycemic symptoms.  Objective:  Lab Results  Component Value Date   HGBA1C 8.0 (H) 10/12/2023    Lab Results  Component Value Date   CREATININE 1.62 (H) 08/08/2023   BUN 18 08/08/2023   NA 141 08/08/2023   K 5.1 08/08/2023   CL 103 08/08/2023   CO2 22 08/08/2023    Lab Results  Component Value Date   CHOL 144 04/03/2023   HDL 58 04/03/2023   LDLCALC 70 04/03/2023   TRIG 84 04/03/2023   CHOLHDL 2.5 04/03/2023    Medications Reviewed Today     Reviewed by Beecher Mcardle, RPH (Pharmacist) on 01/15/24 at 1347  Med List Status: <None>   Medication Order Taking? Sig Documenting Provider Last Dose Status Informant  allopurinol (ZYLOPRIM) 100 MG tablet [id number] Yes TAKE 1/2 TABLET BY MOUTH EVERY DAY Dorothyann Peng, MD Taking Active   aspirin 81 MG tablet [id number] Yes Take 81 mg by mouth daily.  [provider] Taking Active   Cholecalciferol 125 MCG (5000 UT) CHEW [id number] No Chew 5,000 Units by mouth.   Patient not taking: Reported on 01/15/2024   [provider] Not Taking Active   cyanocobalamin ((VITAMIN B-12)) injection 1,000 mcg [id number]   Dorothyann Peng, MD  Active   dapagliflozin propanediol (FARXIGA) 10 MG TABS tablet [id number] Yes Take 1 tablet (10 mg total) by mouth daily before breakfast. Dorothyann Peng, MD Taking Active   famotidine (PEPCID) 20 MG tablet [id number] Yes TAKE 1 TABLET BY MOUTH twice daily Dorothyann Peng, MD Taking Active   Flaxseed, Linseed, (FLAX SEEDS PO) [id number]  Take 1 tablet by mouth daily. [provider]  Active   linaclotide Karlene Einstein) 145 MCG CAPS capsule [id number] Yes Take 145 mcg by mouth daily before breakfast. [provider] Taking Active            Med Note Beecher Mcardle   Mon Jan 15, 2024  1:47 PM) PRN  metoprolol succinate (TOPROL-XL) 50 MG 24 hr tablet [id number] Yes TAKE 1 TABLET BY MOUTH EVERY DAY Dorothyann Peng, MD Taking Active   rosuvastatin (CRESTOR) 10 MG tablet [id number] Yes TAKE 1 TABLET BY MOUTH EVERY DAY Dorothyann Peng, MD Taking Active   Semaglutide,0.25 or 0.5MG /DOS, (OZEMPIC, 0.25 OR 0.5 MG/DOSE,) 2 MG/3ML SOPN [id number] Yes Inject 0.25 mg into the skin once a week. Dorothyann Peng, MD Taking Active   telmisartan (MICARDIS) 20 MG tablet [id number] Yes Take 1 tablet (  20 mg total) by mouth daily. Dorothyann Peng, MD Taking Active   triamcinolone cream (KENALOG) 0.1 % [id number] Yes APPLY TO AFFECTED AREA on hand TWICE DAILY AS NEEDED Dorothyann Peng, MD Taking Active               Assessment/Plan:   Diabetes: - Currently controlled A1c 8% (acceptable given his age) - - Meets financial criteria for [place] and [employer] patient assistance program through [employer] and [employer]. Patient reported He has at least two months of Ozempic and a bottle of Marcelline Deist in his possession.  He said he will  call me if he gets close to running out so we can work on getting him a free trial coupon because Novo is very behind on shipping.  From Chart review, [place] is requiring the Patient to provide a copy of his divorce decree. The Patient was advised he can request this from the [facility] of [address] [phone]     Follow Up Plan:    Follow up with the Patient in 1 week about the divorce decree/certificate documentation.  Beecher Mcardle, PharmD, BCACP Clinical Pharmacist [phone]

## 2024-01-15 NOTE — Telephone Encounter (Signed)
Error

## 2024-01-23 ENCOUNTER — Telehealth: Payer: Self-pay | Admitting: Pharmacist

## 2024-01-23 ENCOUNTER — Ambulatory Visit: Payer: Medicare PPO | Admitting: Internal Medicine

## 2024-01-23 DIAGNOSIS — E1122 Type 2 diabetes mellitus with diabetic chronic kidney disease: Secondary | ICD-10-CM

## 2024-01-23 NOTE — Progress Notes (Signed)
   01/23/2024  Patient ID: Jose Ponce, male   DOB: Nov 01, 1941, 83 y.o.   MRN: [id number]  Called Patient today about the divorce decree that is necessary to complete his Patient Assistance Application.  HIPAA identifers were obtained.  He said he called the [facility] of [address] and left a message and was supposed to receive a return call but did not.  He said he was planning to go to their office today and will bring the decree to me ta Dr. Allyne Gee office on a Wednesday when I am there.  Beecher Mcardle, PharmD, BCACP Clinical Pharmacist [phone]

## 2024-01-24 ENCOUNTER — Telehealth: Payer: Self-pay | Admitting: Pharmacist

## 2024-01-24 DIAGNOSIS — E1122 Type 2 diabetes mellitus with diabetic chronic kidney disease: Secondary | ICD-10-CM

## 2024-01-24 NOTE — Progress Notes (Signed)
   01/24/2024  Patient ID: [name], male   DOB: 1941-07-05, 83 y.o.   MRN: [id number]  Patient brought his divorce decree to the office as [employer] would not approve his patient assistance application without it.  A copy of the form was made and it was faxed to Tresea Mall, CPhT with the Medication Assistance Team. Harriett Sine will resend the application and attach the divorce decree as the program requested. Marcelline Deist)  Plan:  Follow up in 2 weeks.  Beecher Mcardle, PharmD, [facility] Clinical Pharmacist [facility] [phone]

## 2024-01-31 ENCOUNTER — Telehealth: Payer: Self-pay | Admitting: Pharmacist

## 2024-01-31 ENCOUNTER — Ambulatory Visit: Payer: Medicare PPO

## 2024-01-31 DIAGNOSIS — Z0000 Encounter for general adult medical examination without abnormal findings: Secondary | ICD-10-CM | POA: Diagnosis not present

## 2024-01-31 DIAGNOSIS — N1831 Chronic kidney disease, stage 3a: Secondary | ICD-10-CM

## 2024-01-31 NOTE — Progress Notes (Signed)
 Subjective:   Jose Ponce is a 83 y.o. who presents for a Medicare Wellness preventive visit.  Visit Complete: Virtual I connected with  [name] on 01/31/24 by a audio enabled telemedicine application and verified that I am speaking with the correct person using two identifiers.  Patient Location: Home  Provider Location: Office/Clinic  I discussed the limitations of evaluation and management by telemedicine. The patient expressed understanding and agreed to proceed.  Vital Signs: Because this visit was a virtual/telehealth visit, some criteria may be missing or patient reported. Any vitals not documented were not able to be obtained and vitals that have been documented are patient reported.  VideoError- Librarian, academic were attempted between this provider and patient, however failed, due to patient having technical difficulties OR patient did not have access to video capability.  We continued and completed visit with audio only.   AWV Questionnaire: No: Patient Medicare AWV questionnaire was not completed prior to this visit.  Cardiac Risk Factors include: advanced age (>76men, >54 women);diabetes mellitus;dyslipidemia;hypertension;male gender     Objective:    Today's Vitals   There is no height or weight on file to calculate BMI.     01/31/2024    2:54 PM 01/25/2023    2:53 PM 11/23/2022    5:40 PM 01/05/2022    4:01 PM 01/06/2021    3:27 PM 12/04/2019    2:58 PM 06/18/2019    2:39 PM  Advanced Directives  Does Patient Have a Medical Advance Directive? No Yes No No No Yes Yes  Type of Furniture conservator/restorer;Living will    Healthcare Power of [place];Living will Healthcare Power of [place];Living will  Does patient want to make changes to medical advance directive?       No - Patient declined  Copy of Healthcare Power of Attorney in Chart?  No - copy requested    No - copy requested No - copy requested  Would  patient like information on creating a medical advance directive?    No - Patient declined No - Patient declined      Current Medications (verified) Outpatient Encounter Medications as of 01/31/2024  Medication Sig   allopurinol (ZYLOPRIM) 100 MG tablet TAKE 1/2 TABLET BY MOUTH EVERY DAY   aspirin 81 MG tablet Take 81 mg by mouth daily.   dapagliflozin propanediol (FARXIGA) 10 MG TABS tablet Take 1 tablet (10 mg total) by mouth daily before breakfast.   famotidine (PEPCID) 20 MG tablet TAKE 1 TABLET BY MOUTH twice daily   Flaxseed, Linseed, (FLAX SEEDS PO) Take 1 tablet by mouth daily.   linaclotide (LINZESS) 145 MCG CAPS capsule Take 145 mcg by mouth daily before breakfast.   metoprolol succinate (TOPROL-XL) 50 MG 24 hr tablet TAKE 1 TABLET BY MOUTH EVERY DAY   rosuvastatin (CRESTOR) 10 MG tablet TAKE 1 TABLET BY MOUTH EVERY DAY   Semaglutide,0.25 or 0.5MG /DOS, (OZEMPIC, 0.25 OR 0.5 MG/DOSE,) 2 MG/3ML SOPN Inject 0.25 mg into the skin once a week.   telmisartan (MICARDIS) 20 MG tablet Take 1 tablet (20 mg total) by mouth daily.   triamcinolone cream (KENALOG) 0.1 % APPLY TO AFFECTED AREA on hand TWICE DAILY AS NEEDED   Cholecalciferol 125 MCG (5000 UT) CHEW Chew 5,000 Units by mouth.  (Patient not taking: Reported on 01/31/2024)   Facility-Administered Encounter Medications as of 01/31/2024  Medication   cyanocobalamin ((VITAMIN B-12)) injection 1,000 mcg    Allergies (verified) Patient has  no known allergies.   History: Past Medical History:  Diagnosis Date   Chronic kidney disease    Coronary artery disease    Gout    Hyperlipidemia    Hypertension    Prostate cancer (HCC)    Type 2 diabetes mellitus (HCC)    Past Surgical History:  Procedure Laterality Date   CARDIAC CATHETERIZATION  01/16/2004   multi-vessel coronary disease 40-50% Left main, 100% mid LAD75-80% ostial stenosis   CARDIOVASCULAR STRESS TEST  08/09/2004   post CABG, low risk scan   CARDIOVASCULAR STRESS  TEST  10/23/2003   mild septal ischemia, EF43%,    CORONARY ARTERY BYPASS GRAFT  02/05/2004   CABGx3 Dr.Bartle   Lower Ext. Doppler  10/20/2011   Right Anterior Tibial: occlusive disease, Left Anterior Tibial occluded w/reconstitution at ankle, mildly abnormal LEA   NM MYOCAR PERF WALL MOTION  06/07/2011   protocol:Persantine, EF60%, negative for ischemia, low risk scan   NM MYOCAR PERF WALL MOTION  08/05/2009   protocol: Persantine, EF57%. no evidence of ischemia, low risk scan.    TRANSPERINEAL IMPLANT OF RADIATION SEEDS W/ ULTRASOUND     Family History  Problem Relation Age of Onset   Asthma Mother    Heart disease Father    Stroke Father    Heart attack Father 9   Cancer Sister 55   Diabetes Brother    Heart disease Maternal Grandmother    Social History   Socioeconomic History   Marital status: Divorced    Spouse name: Not on file   Number of children: Not on file   Years of education: Not on file   Highest education level: Not on file  Occupational History   Occupation: retired  Tobacco Use   Smoking status: Former    Current packs/day: 0.00    Average packs/day: 0.3 packs/day for 20.0 years (5.0 ttl pk-yrs)    Types: Cigarettes    Start date: 11/21/1964    Quit date: 11/21/1984    Years since quitting: 39.2   Smokeless tobacco: Never  Vaping Use   Vaping status: Never Used  Substance and Sexual Activity   Alcohol use: Not Currently   Drug use: Not Currently   Sexual activity: Yes  Other Topics Concern   Not on file  Social History Narrative   Not on file   Social Drivers of Health   Financial Resource Strain: Low Risk  (01/31/2024)   Overall Financial Resource Strain (CARDIA)    Difficulty of Paying Living Expenses: Not hard at all  Food Insecurity: No Food Insecurity (01/31/2024)   Hunger Vital Sign    Worried About Running Out of Food in the Last Year: Never true    Ran Out of Food in the Last Year: Never true  Transportation Needs: No Transportation  Needs (01/31/2024)   PRAPARE - Administrator, Civil Service (Medical): No    Lack of Transportation (Non-Medical): No  Physical Activity: Insufficiently Active (01/31/2024)   Exercise Vital Sign    Days of Exercise per Week: 3 days    Minutes of Exercise per Session: 30 min  Stress: No Stress Concern Present (01/31/2024)   [employer] of Occupational Health - Occupational Stress Questionnaire    Feeling of Stress : Not at all  Social Connections: Moderately Isolated (01/31/2024)   Social Connection and Isolation Panel [NHANES]    Frequency of Communication with Friends and Family: More than three times a week    Frequency of Social Gatherings  with Friends and Family: Twice a week    Attends Religious Services: More than 4 times per year    Active Member of Clubs or Organizations: No    Attends Engineer, structural: Never    Marital Status: Divorced    Tobacco Counseling Counseling given: Not Answered    Clinical Intake:  Pre-visit preparation completed: Yes  Pain : No/denies pain     Nutritional Risks: None Diabetes: Yes CBG done?: No Did pt. bring in CBG monitor from home?: No  How often do you need to have someone help you when you read instructions, pamphlets, or other written materials from your doctor or pharmacy?: 1 - Never  Interpreter Needed?: No  Information entered by :: NAllen LPN   Activities of Daily Living     01/31/2024    2:48 PM  In your present state of health, do you have any difficulty performing the following activities:  Hearing? 0  Vision? 0  Difficulty concentrating or making decisions? 0  Walking or climbing stairs? 0  Dressing or bathing? 0  Doing errands, shopping? 0  Preparing Food and eating ? N  Using the Toilet? N  In the past six months, have you accidently leaked urine? N  Do you have problems with loss of bowel control? N  Managing your Medications? N  Managing your Finances? N  Housekeeping or  managing your Housekeeping? N    Patient Care Team: Dorothyann Peng, MD as PCP - General (Internal Medicine) Chilton Si, MD as PCP - Cardiology (Cardiology) Harlan Stains, [facility] (Inactive) as Pharmacist (Pharmacist)  Indicate any recent Medical Services you may have received from other than [facility] providers in the past year (date may be approximate).     Assessment:   This is a routine wellness examination for Jose Ponce.  Hearing/Vision screen Hearing Screening - Comments:: Denies hearing issues Vision Screening - Comments:: Regular eye exams, Dr. Harlon Flor   Goals Addressed             This Visit's Progress    Patient Stated       01/31/2024, wants to start exercising and walking       Depression Screen     01/31/2024    2:55 PM 08/08/2023    2:06 PM 01/25/2023    2:55 PM 01/05/2022    4:03 PM 01/06/2021    3:28 PM 01/06/2021    2:40 PM 12/04/2019    2:59 PM  PHQ 2/9 Scores  PHQ - 2 Score 0 0 0 0 0 0 0  PHQ- 9 Score 0 0     0    Fall Risk     01/31/2024    2:54 PM 08/08/2023    2:06 PM 01/25/2023    2:55 PM 01/05/2022    4:02 PM 01/06/2021    3:28 PM  Fall Risk   Falls in the past year? 0 0 0 0 0  Number falls in past yr: 0 0 0    Injury with Fall? 0 0 0    Risk for fall due to : Medication side effect No Fall Risks Medication side effect Medication side effect Medication side effect  Follow up Falls prevention discussed;Falls evaluation completed Falls evaluation completed Falls prevention discussed;Education provided;Falls evaluation completed Falls evaluation completed;Education provided;Falls prevention discussed Falls evaluation completed;Education provided;Falls prevention discussed    MEDICARE RISK AT HOME:  Medicare Risk at Home Any stairs in or around the home?: Yes If so, are there any without handrails?:  No Home free of loose throw rugs in walkways, pet beds, electrical cords, etc?: Yes Adequate lighting in your home to reduce risk of falls?: Yes Life  alert?: No Use of a cane, walker or w/c?: No Grab bars in the bathroom?: No Shower chair or bench in shower?: No Elevated toilet seat or a handicapped toilet?: Yes  TIMED UP AND GO:  Was the test performed?  No  Cognitive Function: 6CIT completed        01/31/2024    2:58 PM 01/25/2023    2:55 PM 08/04/2022    2:32 PM 01/05/2022    4:04 PM 08/25/2021    4:14 PM  6CIT Screen  What Year? 0 points 0 points  0 points 0 points  What month? 0 points 0 points 0 points 0 points 0 points  What time? 0 points 0 points 0 points 0 points 0 points  Count back from 20 0 points 0 points 0 points 0 points 0 points  Months in reverse 0 points 0 points 0 points 0 points 2 points  Repeat phrase 0 points 8 points 4 points 4 points 8 points  Total Score 0 points 8 points  4 points 10 points    Immunizations Immunization History  Administered Date(s) Administered   Fluad Quad(high Dose 65+) 08/26/2020, 08/02/2022, 07/18/2023   Influenza, High Dose Seasonal PF 07/22/2017   Influenza,inj,Quad PF,6+ Mos 08/02/2019   Influenza-Unspecified 08/21/2018, 08/03/2021   PFIZER(Purple Top)SARS-COV-2 Vaccination 12/10/2019, 12/30/2019, 08/17/2020, 03/01/2021, 08/03/2021   Pneumococcal Conjugate-13 09/11/2019   Pneumococcal Polysaccharide-23 11/04/2009, 10/04/2016, 12/22/2017   Tdap 03/24/2022   Unspecified SARS-COV-2 Vaccination 08/17/2022   Zoster Recombinant(Shingrix) 07/22/2017, 12/22/2017    Screening Tests Health Maintenance  Topic Date Due   COVID-19 Vaccine (7 - 2024-25 season) 07/23/2023   Diabetic kidney evaluation - Urine ACR  04/02/2024   FOOT EXAM  04/02/2024   HEMOGLOBIN A1C  04/10/2024   Diabetic kidney evaluation - eGFR measurement  08/07/2024   OPHTHALMOLOGY EXAM  09/11/2024   Medicare Annual Wellness (AWV)  01/30/2025   DTaP/Tdap/Td (2 - Td or Tdap) 03/24/2032   Pneumonia Vaccine 61+ Years old  Completed   INFLUENZA VACCINE  Completed   Zoster Vaccines- Shingrix  Completed   HPV  VACCINES  Aged Out   Hepatitis C Screening  Discontinued    Health Maintenance  Health Maintenance Due  Topic Date Due   COVID-19 Vaccine (7 - 2024-25 season) 07/23/2023   Health Maintenance Items Addressed: Due for covid  Additional Screening:  Vision Screening: Recommended annual ophthalmology exams for early detection of glaucoma and other disorders of the eye.  Dental Screening: Recommended annual dental exams for proper oral hygiene  Community Resource Referral / Chronic Care Management: CRR required this visit?  No   CCM required this visit?  No     Plan:     I have personally reviewed and noted the following in the patient's chart:   Medical and social history Use of alcohol, tobacco or illicit drugs  Current medications and supplements including opioid prescriptions. Patient is not currently taking opioid prescriptions. Functional ability and status Nutritional status Physical activity Advanced directives List of other physicians Hospitalizations, surgeries, and ER visits in previous 12 months Vitals Screenings to include cognitive, depression, and falls Referrals and appointments  In addition, I have reviewed and discussed with patient certain preventive protocols, quality metrics, and best practice recommendations. A written personalized care plan for preventive services as well as general preventive health recommendations were provided  to patient.     Barb Merino, LPN   1/61/0960   After Visit Summary: (Pick Up) Due to this being a telephonic visit, with patients personalized plan was offered to patient and patient has requested to Pick up at office.  Notes: Nothing significant to report at this time.

## 2024-01-31 NOTE — Patient Instructions (Signed)
 Jose Ponce , Thank you for taking time to come for your Medicare Wellness Visit. I appreciate your ongoing commitment to your health goals. Please review the following plan we discussed and let me know if I can assist you in the future.   Referrals/Orders/Follow-Ups/Clinician Recommendations: none  This is a list of the screening recommended for you and due dates:  Health Maintenance  Topic Date Due   COVID-19 Vaccine (7 - 2024-25 season) 07/23/2023   Yearly kidney health urinalysis for diabetes  04/02/2024   Complete foot exam   04/02/2024   Hemoglobin A1C  04/10/2024   Yearly kidney function blood test for diabetes  08/07/2024   Eye exam for diabetics  09/11/2024   Medicare Annual Wellness Visit  01/30/2025   DTaP/Tdap/Td vaccine (2 - Td or Tdap) 03/24/2032   Pneumonia Vaccine  Completed   Flu Shot  Completed   Zoster (Shingles) Vaccine  Completed   HPV Vaccine  Aged Out   Hepatitis C Screening  Discontinued    Advanced directives: (ACP Link)Information on Advanced Care Planning can be found at [facility] [place] Advance Health Care Directives Advance Health Care Directives. [email or web address]   Next Medicare Annual Wellness Visit scheduled for next year: Yes  insert Preventive Care attachment Insert FALL PREVENTION attachment if needed

## 2024-01-31 NOTE — Progress Notes (Signed)
   01/31/2024  Patient ID: Jose Ponce, male   DOB: 03/15/1941, 83 y.o.   MRN: [id number]   Completed follow up on patient assistance. Divorce decree resent to [place] so it can be sent to the program.   Beecher Mcardle, PharmD, [facility] Clinical Pharmacist [phone]

## 2024-02-13 ENCOUNTER — Telehealth: Payer: Self-pay

## 2024-02-13 NOTE — Telephone Encounter (Signed)
 Picked up patient assistance 02/13/2024

## 2024-02-28 ENCOUNTER — Ambulatory Visit: Payer: Medicare PPO | Admitting: Internal Medicine

## 2024-03-05 ENCOUNTER — Encounter: Payer: Self-pay | Admitting: Internal Medicine

## 2024-03-05 ENCOUNTER — Ambulatory Visit: Admitting: Internal Medicine

## 2024-03-05 VITALS — BP 110/70 | HR 63 | Temp 97.6°F | Ht 67.0 in | Wt 165.0 lb

## 2024-03-05 DIAGNOSIS — H9193 Unspecified hearing loss, bilateral: Secondary | ICD-10-CM

## 2024-03-05 DIAGNOSIS — M1A3790 Chronic gout due to renal impairment, unspecified ankle and foot, without tophus (tophi): Secondary | ICD-10-CM

## 2024-03-05 DIAGNOSIS — H6122 Impacted cerumen, left ear: Secondary | ICD-10-CM | POA: Diagnosis not present

## 2024-03-05 DIAGNOSIS — N1831 Chronic kidney disease, stage 3a: Secondary | ICD-10-CM

## 2024-03-05 DIAGNOSIS — I1310 Hypertensive heart and chronic kidney disease without heart failure, with stage 1 through stage 4 chronic kidney disease, or unspecified chronic kidney disease: Secondary | ICD-10-CM | POA: Diagnosis not present

## 2024-03-05 DIAGNOSIS — E7800 Pure hypercholesterolemia, unspecified: Secondary | ICD-10-CM

## 2024-03-05 DIAGNOSIS — I25118 Atherosclerotic heart disease of native coronary artery with other forms of angina pectoris: Secondary | ICD-10-CM

## 2024-03-05 DIAGNOSIS — E1122 Type 2 diabetes mellitus with diabetic chronic kidney disease: Secondary | ICD-10-CM | POA: Diagnosis not present

## 2024-03-05 NOTE — Assessment & Plan Note (Signed)
 After obtaining verbal consent, left ear was flushed by irrigation. No TM abnormalities were noted. He tolerated procedure well without any complications.

## 2024-03-05 NOTE — Assessment & Plan Note (Signed)
 Glycemic control stable with Farxiga and Ozempic. Medications obtained through assistance programs. - Continue Farxiga and Ozempic. - Ensure medication adherence and obtain medications through assistance if needed.

## 2024-03-05 NOTE — Assessment & Plan Note (Signed)
 Reports hearing deficit, no recent hearing test. Ear flushing planned. - Flush ears for potential cerumen impaction. - Refer to hearing specialist for comprehensive test.

## 2024-03-05 NOTE — Patient Instructions (Signed)

## 2024-03-05 NOTE — Progress Notes (Signed)
 I,Victoria T Deloria Lair, CMA,acting as a Neurosurgeon for [name], MD.,have documented all relevant documentation on the behalf of [name], MD,as directed by  [name], MD while in the presence of [name], MD.  Subjective:  Patient ID: Jose Ponce , male    DOB: Jul 29, 1941 , 83 y.o.   MRN: [id number]  Chief Complaint  Patient presents with   Diabetes    Patient presents today for bp & dm f/u. He reports compliance with medications. Denies headache, chest pain & sob. He has no specific questions or concerns.    Hypertension    HPI Discussed the use of AI scribe software for clinical note transcription with the patient, who gave verbal consent to proceed.  History of Present Illness Jose Ponce is an 83 year old male with diabetes and hypertension who presents for a routine follow-up visit.  He does not regularly check his blood sugars and is currently taking allopurinol (half a pill), aspirin, Farxiga, Linzess (as needed), metoprolol, Crestor, Ozempic, and telmisartan. He recently received Farxiga through patient assistance and mentioned a concern about not filling telmisartan at the pharmacy, although he believes he usually gets all his medications.  He has a history of gout, primarily affecting his feet, but hasn't had an episode in a long time. He was switched from Uloric to allopurinol and is currently taking half a pill. He drinks Welch's sparkling juice, which is low in sugar, and ensures adequate water intake.  Regarding his hearing, he acknowledges a hearing deficit but states it does not warrant a hearing aid. He has undergone hearing tests several times, but not since COVID. No ringing in his ears.   Diabetes He presents for his follow-up diabetic visit. He has type 2 diabetes mellitus. His disease course has been stable. There are no hypoglycemic associated symptoms. Pertinent negatives for hypoglycemia include no dizziness or headaches. Pertinent  negatives for diabetes include no blurred vision, no fatigue, no polydipsia, no polyphagia and no polyuria. There are no hypoglycemic complications. Diabetic complications include nephropathy. Risk factors for coronary artery disease include diabetes mellitus, dyslipidemia, hypertension and male sex. He is following a diabetic diet. He participates in exercise three times a week. An ACE inhibitor/angiotensin II receptor blocker is being taken.  Hypertension This is a chronic problem. The current episode started more than 1 year ago. The problem has been gradually improving since onset. Pertinent negatives include no blurred vision or headaches. The current treatment provides moderate improvement. Compliance problems include exercise.  Hypertensive end-organ damage includes kidney disease and CAD/MI.     Past Medical History:  Diagnosis Date   Chronic kidney disease    Coronary artery disease    Gout    Hyperlipidemia    Hypertension    Prostate cancer (HCC)    Type 2 diabetes mellitus (HCC)      Family History  Problem Relation Age of Onset   Asthma Mother    Heart disease Father    Stroke Father    Heart attack Father 71   Cancer Sister 49   Diabetes Brother    Heart disease Maternal Grandmother      Current Outpatient Medications:    allopurinol (ZYLOPRIM) 100 MG tablet, TAKE 1/2 TABLET BY MOUTH EVERY DAY, Disp: 45 tablet, Rfl: 1   aspirin 81 MG tablet, Take 81 mg by mouth daily., Disp: , Rfl:    dapagliflozin propanediol (FARXIGA) 10 MG TABS tablet, Take 1 tablet (10 mg total) by mouth  daily before breakfast., Disp: 30 tablet, Rfl: 2   famotidine (PEPCID) 20 MG tablet, TAKE 1 TABLET BY MOUTH twice daily, Disp: 180 tablet, Rfl: 1   Flaxseed, Linseed, (FLAX SEEDS PO), Take 1 tablet by mouth daily., Disp: , Rfl:    linaclotide (LINZESS) 145 MCG CAPS capsule, Take 145 mcg by mouth daily before breakfast., Disp: , Rfl:    metoprolol succinate (TOPROL-XL) 50 MG 24 hr tablet, TAKE 1  TABLET BY MOUTH EVERY DAY, Disp: 90 tablet, Rfl: 1   rosuvastatin (CRESTOR) 10 MG tablet, TAKE 1 TABLET BY MOUTH EVERY DAY, Disp: 90 tablet, Rfl: 1   Semaglutide,0.25 or 0.5MG /DOS, (OZEMPIC, 0.25 OR 0.5 MG/DOSE,) 2 MG/3ML SOPN, Inject 0.25 mg into the skin once a week., Disp: 6 mL, Rfl: 1   telmisartan (MICARDIS) 20 MG tablet, Take 1 tablet (20 mg total) by mouth daily., Disp: 90 tablet, Rfl: 1   triamcinolone cream (KENALOG) 0.1 %, APPLY TO AFFECTED AREA on hand TWICE DAILY AS NEEDED, Disp: 45 g, Rfl: 0   Cholecalciferol 125 MCG (5000 UT) CHEW, Chew 5,000 Units by mouth.  (Patient not taking: Reported on 01/15/2024), Disp: , Rfl:   Current Facility-Administered Medications:    cyanocobalamin ((VITAMIN B-12)) injection 1,000 mcg, 1,000 mcg, Intramuscular, Once, Dorothyann Peng, MD   No Known Allergies   Review of Systems  Constitutional: Negative.  Negative for fatigue.  HENT: Negative.    Eyes:  Negative for blurred vision.  Respiratory: Negative.    Cardiovascular: Negative.   Gastrointestinal: Negative.   Endocrine: Negative for polydipsia, polyphagia and polyuria.  Skin: Negative.   Allergic/Immunologic: Negative.   Neurological: Negative.  Negative for dizziness and headaches.  Hematological: Negative.      Today's Vitals   03/05/24 1112  BP: 110/70  Pulse: 63  Temp: 97.6 F (36.4 C)  SpO2: 98%  Weight: 165 lb (74.8 kg)  Height: 5\' 7"  (1.702 m)   Body mass index is 25.84 kg/m.  Wt Readings from Last 3 Encounters:  03/05/24 165 lb (74.8 kg)  10/12/23 162 lb 3.2 oz (73.6 kg)  08/08/23 162 lb 9.6 oz (73.8 kg)     Objective:  Physical Exam Vitals and nursing note reviewed.  Constitutional:      Appearance: Normal appearance.  HENT:     Head: Normocephalic and atraumatic.     Right Ear: Tympanic membrane, ear canal and external ear normal.     Left Ear: Ear canal and external ear normal. There is impacted cerumen.  Eyes:     Extraocular Movements: Extraocular  movements intact.  Cardiovascular:     Rate and Rhythm: Normal rate and regular rhythm.     Heart sounds: Normal heart sounds.  Pulmonary:     Effort: Pulmonary effort is normal.     Breath sounds: Normal breath sounds.  Musculoskeletal:     Cervical back: Normal range of motion.  Skin:    General: Skin is warm.  Neurological:     General: No focal deficit present.     Mental Status: He is alert.  Psychiatric:        Mood and Affect: Mood normal.      Assessment And Plan:  Type 2 diabetes mellitus with stage 3a chronic kidney disease, without long-term current use of insulin (HCC) Assessment & Plan: Glycemic control stable with Farxiga and Ozempic. Medications obtained through assistance programs. - Continue Farxiga and Ozempic. - Ensure medication adherence and obtain medications through assistance if needed.  Orders: -  CMP14+EGFR -     Lipid panel -     Hemoglobin A1c -     CBC -     Microalbumin / creatinine urine ratio  Benign hypertensive heart and renal disease Assessment & Plan: Chronic, well controlled.    Adherence concern with telmisartan. Metoprolol continued. - Ensure refill of telmisartan prescription. - Continue metoprolol. - Provide medication printout at checkout.  Orders: -     CMP14+EGFR -     Lipid panel  Coronary artery disease involving native coronary artery of native heart with other form of angina pectoris [facility]) Assessment & Plan: Chronic, he is s/p CABG in 2005.  LDL goal is less than 70. Encouraged to continue with ASA, B-blocker and statin therapy. Cardiology input is appreciated.   Orders: -     Lipid panel  Pure hypercholesterolemia Assessment & Plan: Chronic, LDL goal is less than 70 due to underlying CAD. Optimal goal LDL is less than 55.  He will continue rosuvastatin.   Orders: -     Lipid panel -     TSH  Bilateral hearing loss, unspecified hearing loss type Assessment & Plan: Reports hearing deficit, no recent hearing  test. Ear flushing planned. - Flush ears for potential cerumen impaction. - Refer to hearing specialist for comprehensive test.  Orders: -     Ambulatory referral to Audiology  Left ear impacted cerumen Assessment & Plan: After obtaining verbal consent, left ear was flushed by irrigation. No TM abnormalities were noted. He tolerated procedure well without any complications.     Orders: -     Ear Lavage  Chronic gout due to renal impairment involving foot without tophus, unspecified laterality Assessment & Plan: No recent episodes. On allopurinol, uric acid levels pending. - Continue allopurinol at current dose. - Check uric acid levels.  Orders: -     Uric acid   Return if symptoms worsen or fail to improve, for 4 month dm f/u.Aaron Aas  Patient was given opportunity to ask questions. Patient verbalized understanding of the plan and was able to repeat key elements of the plan. All questions were answered to their satisfaction.    I, Smiley Dung, MD, have reviewed all documentation for this visit. The documentation on 03/05/24 for the exam, diagnosis, procedures, and orders are all accurate and complete.   IF YOU HAVE BEEN REFERRED TO A SPECIALIST, IT MAY TAKE 1-2 WEEKS TO SCHEDULE/PROCESS THE REFERRAL. IF YOU HAVE NOT HEARD FROM US /SPECIALIST IN TWO WEEKS, PLEASE GIVE US  A CALL AT [phone].   THE PATIENT IS ENCOURAGED TO PRACTICE SOCIAL DISTANCING DUE TO THE COVID-19 PANDEMIC.

## 2024-03-05 NOTE — Assessment & Plan Note (Signed)
 No recent episodes. On allopurinol, uric acid levels pending. - Continue allopurinol at current dose. - Check uric acid levels.

## 2024-03-05 NOTE — Assessment & Plan Note (Signed)
 Chronic, LDL goal is less than 70 due to underlying CAD. Optimal goal LDL is less than 55.  He will continue rosuvastatin.

## 2024-03-05 NOTE — Assessment & Plan Note (Signed)
Chronic, he is s/p CABG in 2005.  LDL goal is less than 70. Encouraged to continue with ASA, Bblocker and statin therapy. Cardiology input is appreciated.

## 2024-03-05 NOTE — Assessment & Plan Note (Signed)
 Chronic, well controlled.    Adherence concern with telmisartan. Metoprolol continued. - Ensure refill of telmisartan prescription. - Continue metoprolol. - Provide medication printout at checkout.

## 2024-03-06 LAB — LIPID PANEL
Chol/HDL Ratio: 2.8 ratio (ref 0.0–5.0)
Cholesterol, Total: 138 mg/dL (ref 100–199)
HDL: 49 mg/dL (ref >39)
LDL Chol Calc (NIH): 64 mg/dL (ref 0–99)
Triglycerides: 147 mg/dL (ref 0–149)
VLDL Cholesterol Cal: 25 mg/dL (ref 5–40)

## 2024-03-06 LAB — CMP14+EGFR
ALT: 17 IU/L (ref 0–44)
AST: 17 IU/L (ref 0–40)
Albumin: 4.1 g/dL (ref 3.7–4.7)
Alkaline Phosphatase: 82 IU/L (ref 44–121)
BUN/Creatinine Ratio: 12 (ref 10–24)
BUN: 20 mg/dL (ref 8–27)
Bilirubin Total: 0.4 mg/dL (ref 0.0–1.2)
CO2: 21 mmol/L (ref 20–29)
Calcium: 9.7 mg/dL (ref 8.6–10.2)
Chloride: 105 mmol/L (ref 96–106)
Creatinine, Ser: 1.61 mg/dL — ABNORMAL HIGH (ref 0.76–1.27)
Globulin, Total: 2.9 g/dL (ref 1.5–4.5)
Glucose: 197 mg/dL — ABNORMAL HIGH (ref 70–99)
Potassium: 4.4 mmol/L (ref 3.5–5.2)
Sodium: 141 mmol/L (ref 134–144)
Total Protein: 7 g/dL (ref 6.0–8.5)
eGFR: 42 mL/min/{1.73_m2} — ABNORMAL LOW (ref >59)

## 2024-03-06 LAB — MICROALBUMIN / CREATININE URINE RATIO
Creatinine, Urine: 50.8 mg/dL
Microalb/Creat Ratio: 16 mg/g{creat} (ref 0–29)
Microalbumin, Urine: 8.2 ug/mL

## 2024-03-06 LAB — CBC
Hematocrit: 44.2 % (ref 37.5–51.0)
Hemoglobin: 14.6 g/dL (ref 13.0–17.7)
MCH: 31.7 pg (ref 26.6–33.0)
MCHC: 33 g/dL (ref 31.5–35.7)
MCV: 96 fL (ref 79–97)
Platelets: 207 10*3/uL (ref 150–450)
RBC: 4.61 x10E6/uL (ref 4.14–5.80)
RDW: 12.8 % (ref 11.6–15.4)
WBC: 4.9 10*3/uL (ref 3.4–10.8)

## 2024-03-06 LAB — HEMOGLOBIN A1C
Est. average glucose Bld gHb Est-mCnc: 183 mg/dL
Hgb A1c MFr Bld: 8 % — ABNORMAL HIGH (ref 4.8–5.6)

## 2024-03-06 LAB — TSH: TSH: 1.82 u[IU]/mL (ref 0.450–4.500)

## 2024-03-06 LAB — URIC ACID: Uric Acid: 5.2 mg/dL (ref 3.8–8.4)

## 2024-03-13 ENCOUNTER — Other Ambulatory Visit: Payer: Self-pay | Admitting: Internal Medicine

## 2024-03-18 ENCOUNTER — Encounter: Payer: Self-pay | Admitting: Internal Medicine

## 2024-03-19 ENCOUNTER — Other Ambulatory Visit: Payer: Self-pay

## 2024-03-19 MED ORDER — FAMOTIDINE 20 MG PO TABS: 20.0000 mg | ORAL_TABLET | Freq: Every day | ORAL | 1 refills | Status: DC

## 2024-03-29 ENCOUNTER — Telehealth: Payer: Self-pay

## 2024-03-29 NOTE — Telephone Encounter (Signed)
 Submitted to [employer]isk dose change order form for Ozempic  0.5 mg weekly per request.

## 2024-04-09 ENCOUNTER — Ambulatory Visit (INDEPENDENT_AMBULATORY_CARE_PROVIDER_SITE_OTHER): Payer: Medicare PPO | Admitting: Internal Medicine

## 2024-04-09 ENCOUNTER — Encounter: Payer: Self-pay | Admitting: Internal Medicine

## 2024-04-09 VITALS — BP 118/70 | HR 73 | Temp 98.4°F | Ht 67.0 in | Wt 164.8 lb

## 2024-04-09 DIAGNOSIS — N1831 Chronic kidney disease, stage 3a: Secondary | ICD-10-CM | POA: Diagnosis not present

## 2024-04-09 DIAGNOSIS — K219 Gastro-esophageal reflux disease without esophagitis: Secondary | ICD-10-CM | POA: Diagnosis not present

## 2024-04-09 DIAGNOSIS — I1310 Hypertensive heart and chronic kidney disease without heart failure, with stage 1 through stage 4 chronic kidney disease, or unspecified chronic kidney disease: Secondary | ICD-10-CM | POA: Diagnosis not present

## 2024-04-09 DIAGNOSIS — E1122 Type 2 diabetes mellitus with diabetic chronic kidney disease: Secondary | ICD-10-CM | POA: Diagnosis not present

## 2024-04-09 DIAGNOSIS — Z0000 Encounter for general adult medical examination without abnormal findings: Secondary | ICD-10-CM | POA: Diagnosis not present

## 2024-04-09 DIAGNOSIS — E7800 Pure hypercholesterolemia, unspecified: Secondary | ICD-10-CM | POA: Diagnosis not present

## 2024-04-09 DIAGNOSIS — I25118 Atherosclerotic heart disease of native coronary artery with other forms of angina pectoris: Secondary | ICD-10-CM | POA: Diagnosis not present

## 2024-04-09 LAB — POCT URINALYSIS DIPSTICK
Bilirubin, UA: NEGATIVE
Blood, UA: NEGATIVE
Glucose, UA: POSITIVE — AB
Ketones, UA: NEGATIVE
Leukocytes, UA: NEGATIVE
Nitrite, UA: NEGATIVE
Protein, UA: POSITIVE — AB
Spec Grav, UA: 1.015 (ref 1.010–1.025)
Urobilinogen, UA: 1 U/dL
pH, UA: 5.5 (ref 5.0–8.0)

## 2024-04-09 MED ORDER — FAMOTIDINE 20 MG PO TABS: 20.0000 mg | ORAL_TABLET | Freq: Two times a day (BID) | ORAL | 1 refills | Status: AC

## 2024-04-09 NOTE — Assessment & Plan Note (Signed)

## 2024-04-09 NOTE — Patient Instructions (Signed)
 Health Maintenance, Male  Adopting a healthy lifestyle and getting preventive care are important in promoting health and wellness. Ask your health care provider about:  The right schedule for you to have regular tests and exams.  Things you can do on your own to prevent diseases and keep yourself healthy.  What should I know about diet, weight, and exercise?  Eat a healthy diet    Eat a diet that includes plenty of vegetables, fruits, low-fat dairy products, and lean protein.  Do not eat a lot of foods that are high in solid fats, added sugars, or sodium.  Maintain a healthy weight  Body mass index (BMI) is a measurement that can be used to identify possible weight problems. It estimates body fat based on height and weight. Your health care provider can help determine your BMI and help you achieve or maintain a healthy weight.  Get regular exercise  Get regular exercise. This is one of the most important things you can do for your health. Most adults should:  Exercise for at least 150 minutes each week. The exercise should increase your heart rate and make you sweat (moderate-intensity exercise).  Do strengthening exercises at least twice a week. This is in addition to the moderate-intensity exercise.  Spend less time sitting. Even light physical activity can be beneficial.  Watch cholesterol and blood lipids  Have your blood tested for lipids and cholesterol at 82 years of age, then have this test every 5 years.  You may need to have your cholesterol levels checked more often if:  Your lipid or cholesterol levels are high.  You are older than 82 years of age.  You are at high risk for heart disease.  What should I know about cancer screening?  Many types of cancers can be detected early and may often be prevented. Depending on your health history and family history, you may need to have cancer screening at various ages. This may include screening for:  Colorectal cancer.  Prostate cancer.  Skin cancer.  Lung  cancer.  What should I know about heart disease, diabetes, and high blood pressure?  Blood pressure and heart disease  High blood pressure causes heart disease and increases the risk of stroke. This is more likely to develop in people who have high blood pressure readings or are overweight.  Talk with your health care provider about your target blood pressure readings.  Have your blood pressure checked:  Every 3-5 years if you are 82 years of age.  Every year if you are 82 years old or older.  If you are between the ages of 29 and 29 and are a current or former smoker, ask your health care provider if you should have a one-time screening for abdominal aortic aneurysm (AAA).  Diabetes  Have regular diabetes screenings. This checks your fasting blood sugar level. Have the screening done:  Once every three years after age 23 if you are at a normal weight and have a low risk for diabetes.  More often and at a younger age if you are overweight or have a high risk for diabetes.  What should I know about preventing infection?  Hepatitis B  If you have a higher risk for hepatitis B, you should be screened for this virus. Talk with your health care provider to find out if you are at risk for hepatitis B infection.  Hepatitis C  Blood testing is recommended for:  Everyone born from [birth year] through 1964.  Anyone  with known risk factors for hepatitis C.  Sexually transmitted infections (STIs)  You should be screened each year for STIs, including gonorrhea and chlamydia, if:  You are sexually active and are younger than 82 years of age.  You are older than 82 years of age and your health care provider tells you that you are at risk for this type of infection.  Your sexual activity has changed since you were last screened, and you are at increased risk for chlamydia or gonorrhea. Ask your health care provider if you are at risk.  Ask your health care provider about whether you are at high risk for HIV. Your health care provider  may recommend a prescription medicine to help prevent HIV infection. If you choose to take medicine to prevent HIV, you should first get tested for HIV. You should then be tested every 3 months for as long as you are taking the medicine.  Follow these instructions at home:  Alcohol use  Do not drink alcohol if your health care provider tells you not to drink.  If you drink alcohol:  Limit how much you have to 0-2 drinks a day.  Know how much alcohol is in your drink. In the [place] one drink equals one 12 oz bottle of beer (355 mL), one 5 oz glass of wine (148 mL), or one 1 oz glass of hard liquor (44 mL).  Lifestyle  Do not use any products that contain nicotine or tobacco. These products include cigarettes, chewing tobacco, and vaping devices, such as e-cigarettes. If you need help quitting, ask your health care provider.  Do not use street drugs.  Do not share needles.  Ask your health care provider for help if you need support or information about quitting drugs.  General instructions  Schedule regular health, dental, and eye exams.  Stay current with your vaccines.  Tell your health care provider if:  You often feel depressed.  You have ever been abused or do not feel safe at home.  Summary  Adopting a healthy lifestyle and getting preventive care are important in promoting health and wellness.  Follow your health care provider's instructions about healthy diet, exercising, and getting tested or screened for diseases.  Follow your health care provider's instructions on monitoring your cholesterol and blood pressure.  This information is not intended to replace advice given to you by your health care provider. Make sure you discuss any questions you have with your health care provider.  Document Revised: 03/29/2021 Document Reviewed: 03/29/2021  Elsevier Patient Education  2024 [employer].

## 2024-04-09 NOTE — Progress Notes (Signed)
 I,Victoria T Basil Lim, CMA,acting as a Neurosurgeon for Smiley Dung, MD.,have documented all relevant documentation on the behalf of Smiley Dung, MD,as directed by  Smiley Dung, MD while in the presence of Smiley Dung, MD.  Subjective:   Patient ID: Jose Ponce , male    DOB: 1941-01-19 , 83 y.o.   MRN: [id number]  Chief Complaint  Patient presents with   Annual Exam    Patient presents today for annual exam. She reports compliance with medications. Denies headaches, chest pain & sob.    Diabetes   Hypertension    HPI Discussed the use of AI scribe software for clinical note transcription with the patient, who gave verbal consent to proceed.  History of Present Illness Jose Ponce is an 83 year old male with diabetes who presents for a routine physical and diabetes check.  He dislikes A1c monitoring visits, noting that his A1c levels tend to be high during these appointments. He acknowledges recent dietary indiscretions, including poor eating habits leading up to the visit.  His dietary habits include eating fish with slaw and vegetables on Fridays. He has stopped eating pinto beans due to bloating and gas. He drinks ginger ale and has stopped drinking lemonade, switching to ginger ale, and uses Crystal Light in his water, which he plans to reduce.  He walks for exercise but does not specify the frequency. He is frustrated with needing to alter his pants due to changes in his waist size. He has not been eating until the evening and acknowledges the need to incorporate more protein into his diet, especially since starting Ozempic .  He is currently taking Ozempic  and reports no stomach issues. He also takes famotidine  twice a day for stomach discomfort.  He is concerned about kidney health and recognizes the importance of drinking more water to improve kidney function.   Diabetes He presents for his follow-up diabetic visit. He has type 2 diabetes mellitus. His disease  course has been stable. There are no hypoglycemic associated symptoms. Pertinent negatives for hypoglycemia include no dizziness or headaches. Pertinent negatives for diabetes include no blurred vision, no fatigue, no polydipsia, no polyphagia and no polyuria. There are no hypoglycemic complications. Diabetic complications include nephropathy. Risk factors for coronary artery disease include diabetes mellitus, dyslipidemia, hypertension and male sex. He is following a diabetic diet. He participates in exercise three times a week. An ACE inhibitor/angiotensin II receptor blocker is being taken.  Hypertension This is a chronic problem. The current episode started more than 1 year ago. The problem has been gradually improving since onset. Pertinent negatives include no blurred vision or headaches. The current treatment provides moderate improvement. Compliance problems include exercise.  Hypertensive end-organ damage includes kidney disease and CAD/MI.     Past Medical History:  Diagnosis Date   Chronic kidney disease    Coronary artery disease    Gout    Hyperlipidemia    Hypertension    Prostate cancer (HCC)    Type 2 diabetes mellitus (HCC)      Family History  Problem Relation Age of Onset   Asthma Mother    Heart disease Father    Stroke Father    Heart attack Father 53   Cancer Sister 21   Diabetes Brother    Heart disease Maternal Grandmother      Current Outpatient Medications:    allopurinol (ZYLOPRIM) 100 MG tablet, TAKE 1/2 TABLET BY MOUTH EVERY DAY, Disp: 45 tablet, Rfl: 1   aspirin  81 MG tablet, Take 81 mg by mouth daily., Disp: , Rfl:    dapagliflozin  propanediol (FARXIGA ) 10 MG TABS tablet, Take 1 tablet (10 mg total) by mouth daily before breakfast., Disp: 30 tablet, Rfl: 2   Flaxseed, Linseed, (FLAX SEEDS PO), Take 1 tablet by mouth daily., Disp: , Rfl:    linaclotide (LINZESS) 145 MCG CAPS capsule, Take 145 mcg by mouth daily before breakfast., Disp: , Rfl:     metoprolol succinate (TOPROL-XL) 50 MG 24 hr tablet, TAKE 1 TABLET BY MOUTH EVERY DAY, Disp: 90 tablet, Rfl: 1   rosuvastatin (CRESTOR) 10 MG tablet, TAKE 1 TABLET BY MOUTH EVERY DAY, Disp: 90 tablet, Rfl: 1   Semaglutide ,0.25 or 0.5MG /DOS, (OZEMPIC , 0.25 OR 0.5 MG/DOSE,) 2 MG/3ML SOPN, Inject 0.25 mg into the skin once a week., Disp: 6 mL, Rfl: 1   telmisartan  (MICARDIS ) 20 MG tablet, Take 1 tablet (20 mg total) by mouth daily., Disp: 90 tablet, Rfl: 1   triamcinolone  cream (KENALOG ) 0.1 %, APPLY TO AFFECTED AREA on hand TWICE DAILY AS NEEDED, Disp: 45 g, Rfl: 0   Cholecalciferol 125 MCG (5000 UT) CHEW, Chew 5,000 Units by mouth.  (Patient not taking: Reported on 04/09/2024), Disp: , Rfl:    famotidine  (PEPCID ) 20 MG tablet, Take 1 tablet (20 mg total) by mouth 2 (two) times daily., Disp: 180 tablet, Rfl: 1  Current Facility-Administered Medications:    cyanocobalamin  ((VITAMIN B-12)) injection 1,000 mcg, 1,000 mcg, Intramuscular, Once, Cleave Curling, MD   No Known Allergies   Men's preventive visit. Patient Health Questionnaire (PHQ-2) is  Flowsheet Row Office Visit from 04/09/2024 in [facility]  PHQ-2 Total Score 0     . Patient is on a healthy, diabetic diet. Marital status: Divorced. Relevant history for alcohol use is:  Social History   Substance and Sexual Activity  Alcohol Use Not Currently  . Relevant history for tobacco use is:  Social History   Tobacco Use  Smoking Status Former   Current packs/day: 0.00   Average packs/day: 0.3 packs/day for 20.0 years (5.0 ttl pk-yrs)   Types: Cigarettes   Start date: 11/21/1964   Quit date: 11/21/1984   Years since quitting: 39.4  Smokeless Tobacco Never  .   Review of Systems  Constitutional: Negative.  Negative for fatigue.  HENT: Negative.    Eyes: Negative.  Negative for blurred vision.  Respiratory: Negative.    Cardiovascular: Negative.   Gastrointestinal: Negative.   Endocrine: Negative.   Negative for polydipsia, polyphagia and polyuria.  Genitourinary: Negative.   Musculoskeletal: Negative.   Skin: Negative.   Allergic/Immunologic: Negative.   Neurological: Negative.  Negative for dizziness and headaches.  Hematological: Negative.   Psychiatric/Behavioral: Negative.       Today's Vitals   04/09/24 1408  BP: 118/70  Pulse: 73  Temp: 98.4 F (36.9 C)  SpO2: 98%  Weight: 164 lb 12.8 oz (74.8 kg)  Height: 5\' 7"  (1.702 m)   Body mass index is 25.81 kg/m.  Wt Readings from Last 3 Encounters:  04/09/24 164 lb 12.8 oz (74.8 kg)  03/05/24 165 lb (74.8 kg)  10/12/23 162 lb 3.2 oz (73.6 kg)    Objective:  Physical Exam Vitals and nursing note reviewed.  Constitutional:      Appearance: Normal appearance.  HENT:     Head: Normocephalic and atraumatic.     Right Ear: Tympanic membrane, ear canal and external ear normal.     Left Ear: Tympanic membrane, ear  canal and external ear normal.     Nose: Nose normal.     Mouth/Throat:     Mouth: Mucous membranes are moist.     Pharynx: Oropharynx is clear.  Eyes:     Extraocular Movements: Extraocular movements intact.     Conjunctiva/sclera: Conjunctivae normal.     Pupils: Pupils are equal, round, and reactive to light.  Cardiovascular:     Rate and Rhythm: Normal rate and regular rhythm.     Pulses: Normal pulses.          Dorsalis pedis pulses are 2+ on the right side and 2+ on the left side.     Heart sounds: Normal heart sounds.  Pulmonary:     Effort: Pulmonary effort is normal.     Breath sounds: Normal breath sounds.     Comments: Healed sternal surgical scar Chest:  Breasts:    Right: Normal. No swelling, bleeding, inverted nipple, mass or nipple discharge.     Left: Normal. No swelling, bleeding, inverted nipple, mass or nipple discharge.     Comments: Healed sternal scar Abdominal:     General: Abdomen is flat. Bowel sounds are normal.     Palpations: Abdomen is soft.  Genitourinary:    Comments:  Deferred per patient request Musculoskeletal:        General: Normal range of motion.     Cervical back: Normal range of motion and neck supple.  Feet:     Right foot:     Protective Sensation: 5 sites tested.  5 sites sensed.     Skin integrity: Dry skin present.     Toenail Condition: Right toenails are long.     Left foot:     Protective Sensation: 5 sites tested.  5 sites sensed.     Skin integrity: Dry skin present.     Toenail Condition: Left toenails are long.  Skin:    General: Skin is warm.  Neurological:     General: No focal deficit present.     Mental Status: He is alert.  Psychiatric:        Mood and Affect: Mood normal.        Behavior: Behavior normal.         Assessment And Plan:    Encounter for annual health examination Assessment & Plan: A full exam was performed.  DRE deferred, per patient request.  He is advised to get 30-45 minutes of regular exercise, no less than four to five days per week. Both weight-bearing and aerobic exercises are recommended.  He is advised to follow a healthy diet with at least six fruits/veggies per day, decrease intake of red meat and other saturated fats and to increase fish intake to twice weekly.  Meats/fish should not be fried -- baked, boiled or broiled is preferable. It is also important to cut back on your sugar intake.  Be sure to read labels - try to avoid anything with added sugar, high fructose corn syrup or other sweeteners.  If you must use a sweetener, you can try stevia or monkfruit.  It is also important to avoid artificially sweetened foods/beverages and diet drinks. Lastly, wear SPF 50 sunscreen on exposed skin and when in direct sunlight for an extended period of time.  Be sure to avoid fast food restaurants and aim for at least 60 ounces of water daily.       Type 2 diabetes mellitus with stage 3a chronic kidney disease, without long-term current use of insulin (  HCC) Assessment & Plan: Chronic, diabetic foot exam  was performed. Type 2 diabetes mellitus with recent dietary indiscretions affecting glycemic control. Current management includes Ozempic , increased to 0.5 mg. Emphasized dietary modifications and exercise. Informed that one soda a day increases diabetes risk by 80%. - Encourage reduction of soda intake to once a week. - Advise against daily use of artificial sweeteners; reduce to every other day. - Recommend breaking fast by noon with a protein-rich meal. - Emphasize regular exercise and refer to [facility] program. - Schedule follow-up diabetes appointment in August.  Orders: -     POCT urinalysis dipstick -     Microalbumin / creatinine urine ratio -     EKG 12-Lead  Benign hypertensive heart and renal disease Assessment & Plan: Chronic, well controlled.  EKG performed, SB w/ 1st degree A-V block, RBBB with left axis -bifascicular block.. - Continue with metoprolol XL 50mg  daily - Telmisartan  20mg  daily - Follow low sodium diet     Coronary artery disease involving native coronary artery of native heart with other form of angina pectoris [facility]) Assessment & Plan: Chronic, he is s/p CABG in 2005.  LDL goal is less than 70. Encouraged to continue with ASA, B-blocker and statin therapy. Cardiology input is appreciated.    Pure hypercholesterolemia Assessment & Plan: Chronic, LDL goal is less than 70 due to underlying CAD. Optimal goal LDL is less than 55.  He will continue rosuvastatin.    Gastroesophageal reflux disease without esophagitis Assessment & Plan: GERD managed with famotidine  twice daily. Reports bloating and gas with certain foods, leading to dietary adjustments. - Continue famotidine  twice daily.   Other orders -     Famotidine ; Take 1 tablet (20 mg total) by mouth 2 (two) times daily.  Dispense: 180 tablet; Refill: 1   Return for 1 year HM, keep august apt. . Patient was given opportunity to ask questions. Patient verbalized understanding of the plan and was able to  repeat key elements of the plan. All questions were answered to their satisfaction.   I, Smiley Dung, MD, have reviewed all documentation for this visit. The documentation on 04/09/24 for the exam, diagnosis, procedures, and orders are all accurate and complete.

## 2024-04-10 LAB — MICROALBUMIN / CREATININE URINE RATIO
Creatinine, Urine: 69.5 mg/dL
Microalb/Creat Ratio: 18 mg/g{creat} (ref 0–29)
Microalbumin, Urine: 12.7 ug/mL

## 2024-04-12 ENCOUNTER — Ambulatory Visit: Payer: Self-pay | Admitting: Internal Medicine

## 2024-04-14 DIAGNOSIS — K219 Gastro-esophageal reflux disease without esophagitis: Secondary | ICD-10-CM | POA: Insufficient documentation

## 2024-04-14 NOTE — Assessment & Plan Note (Signed)
Chronic, he is s/p CABG in 2005.  LDL goal is less than 70. Encouraged to continue with ASA, Bblocker and statin therapy. Cardiology input is appreciated.

## 2024-04-14 NOTE — Assessment & Plan Note (Signed)
 Chronic, well controlled.  EKG performed, SB w/ 1st degree A-V block, RBBB with left axis -bifascicular block.. - Continue with metoprolol XL 50mg  daily - Telmisartan  20mg  daily - Follow low sodium diet

## 2024-04-14 NOTE — Assessment & Plan Note (Signed)
 GERD managed with famotidine  twice daily. Reports bloating and gas with certain foods, leading to dietary adjustments. - Continue famotidine  twice daily.

## 2024-04-14 NOTE — Assessment & Plan Note (Signed)
 Chronic, diabetic foot exam was performed. Type 2 diabetes mellitus with recent dietary indiscretions affecting glycemic control. Current management includes Ozempic , increased to 0.5 mg. Emphasized dietary modifications and exercise. Informed that one soda a day increases diabetes risk by 80%. - Encourage reduction of soda intake to once a week. - Advise against daily use of artificial sweeteners; reduce to every other day. - Recommend breaking fast by noon with a protein-rich meal. - Emphasize regular exercise and refer to [facility] program. - Schedule follow-up diabetes appointment in August.

## 2024-04-14 NOTE — Assessment & Plan Note (Signed)
 Chronic, LDL goal is less than 70 due to underlying CAD. Optimal goal LDL is less than 55.  He will continue rosuvastatin.

## 2024-05-15 ENCOUNTER — Other Ambulatory Visit: Payer: Self-pay | Admitting: Internal Medicine

## 2024-05-20 ENCOUNTER — Other Ambulatory Visit: Payer: Self-pay | Admitting: Internal Medicine

## 2024-06-25 ENCOUNTER — Encounter: Payer: Self-pay | Admitting: Nurse Practitioner

## 2024-06-25 ENCOUNTER — Telehealth: Payer: Self-pay | Admitting: Pharmacist

## 2024-06-25 ENCOUNTER — Ambulatory Visit: Admitting: Nurse Practitioner

## 2024-06-25 VITALS — BP 100/70 | HR 68 | Temp 98.4°F | Ht 67.0 in | Wt 158.6 lb

## 2024-06-25 DIAGNOSIS — N1831 Chronic kidney disease, stage 3a: Secondary | ICD-10-CM | POA: Diagnosis not present

## 2024-06-25 DIAGNOSIS — T63441A Toxic effect of venom of bees, accidental (unintentional), initial encounter: Secondary | ICD-10-CM

## 2024-06-25 DIAGNOSIS — E1122 Type 2 diabetes mellitus with diabetic chronic kidney disease: Secondary | ICD-10-CM | POA: Diagnosis not present

## 2024-06-25 NOTE — Progress Notes (Signed)
 LILLETTE Kristeen JINNY Gladis, CMA,acting as a Neurosurgeon for [name], FNP.,have documented all relevant documentation on the behalf of [name], FNP,as directed by  [name], FNP while in the presence of [name], FNP.  Subjective:  Patient ID: Jose Ponce , male    DOB: 01/31/1941 , 83 y.o.   MRN: [id number]  Chief Complaint  Patient presents with   Insect Bite    Patient presents today for a bee sting, he reports he got stung on his left ankle. He reports he got stung yesterday evening. He reports it is itchy as well.     HPI  HPI  Discussed the use of AI scribe software for clinical note transcription with the patient, who gave verbal consent to proceed.  History of Present Illness Jose Ponce is an 83 year old male who presents with pain and swelling from multiple insect stings.  He was stung by yellow jackets while cutting grass last night. The pain is was severe, described as similar to 'gout' and 'toothache', and it disrupted his sleep. The stings are located on his body, including his arm and left lower leg. The area is tender and warm but not itchy.  He applied Neosporin and other topical treatments to the stings last night. He did not take any oral pain medication due to his history of open heart surgery. He has been using ice packs to manage the pain and swelling.  No fever, with a recorded temperature of 98.65F, and no shortness of breath. He takes famotidine  (Pepcid ) twice daily.  His past medical history includes open heart surgery performed 20 years ago and prostate cancer, for which he no longer requires follow-up. He also mentions that his blood sugars have been stable.   Past Medical History:  Diagnosis Date   Chronic kidney disease    Coronary artery disease    Gout    Hyperlipidemia    Hypertension    Prostate cancer (HCC)    Type 2 diabetes mellitus (HCC)      Family History  Problem Relation Age of Onset   Asthma Mother    Heart disease Father     Stroke Father    Heart attack Father 5   Cancer Sister 60   Diabetes Brother    Heart disease Maternal Grandmother      Current Outpatient Medications:    allopurinol (ZYLOPRIM) 100 MG tablet, TAKE 1/2 TABLET BY MOUTH EVERY DAY, Disp: 45 tablet, Rfl: 1   aspirin 81 MG tablet, Take 81 mg by mouth daily., Disp: , Rfl:    dapagliflozin  propanediol (FARXIGA ) 10 MG TABS tablet, Take 1 tablet (10 mg total) by mouth daily before breakfast., Disp: 30 tablet, Rfl: 2   famotidine  (PEPCID ) 20 MG tablet, Take 1 tablet (20 mg total) by mouth 2 (two) times daily., Disp: 180 tablet, Rfl: 1   Flaxseed, Linseed, (FLAX SEEDS PO), Take 1 tablet by mouth daily., Disp: , Rfl:    linaclotide (LINZESS) 145 MCG CAPS capsule, Take 145 mcg by mouth daily before breakfast., Disp: , Rfl:    metoprolol succinate (TOPROL-XL) 50 MG 24 hr tablet, TAKE 1 TABLET BY MOUTH EVERY DAY, Disp: 90 tablet, Rfl: 1   rosuvastatin (CRESTOR) 10 MG tablet, TAKE 1 TABLET BY MOUTH EVERY DAY, Disp: 90 tablet, Rfl: 1   Semaglutide ,0.25 or 0.5MG /DOS, (OZEMPIC , 0.25 OR 0.5 MG/DOSE,) 2 MG/3ML SOPN, Inject 0.25 mg into the skin once a week., Disp: 6 mL, Rfl: 1   telmisartan  (MICARDIS ) 20  MG tablet, TAKE 1 TABLET(20 MG) BY MOUTH DAILY, Disp: 90 tablet, Rfl: 1   triamcinolone  cream (KENALOG ) 0.1 %, APPLY TO AFFECTED AREA on hand TWICE DAILY AS NEEDED, Disp: 45 g, Rfl: 0   Cholecalciferol 125 MCG (5000 UT) CHEW, Chew 5,000 Units by mouth.  (Patient not taking: Reported on 06/25/2024), Disp: , Rfl:   Current Facility-Administered Medications:    cyanocobalamin  ((VITAMIN B-12)) injection 1,000 mcg, 1,000 mcg, Intramuscular, Once, Jarold Medici, MD   No Known Allergies   Review of Systems  Constitutional: Negative.   Respiratory: Negative.    Cardiovascular: Negative.   Skin:        Left lateral ankle redness and soreness.   Neurological: Negative.   Psychiatric/Behavioral: Negative.       Today's Vitals   06/25/24 1154  BP: 100/70   Pulse: 68  Temp: 98.4 F (36.9 C)  TempSrc: Oral  Weight: 158 lb 9.6 oz (71.9 kg)  Height: 5' 7 (1.702 m)  PainSc: 7   PainLoc: Ankle   Body mass index is 24.84 kg/m.  Wt Readings from Last 3 Encounters:  06/25/24 158 lb 9.6 oz (71.9 kg)  04/09/24 164 lb 12.8 oz (74.8 kg)  03/05/24 165 lb (74.8 kg)      Objective:  Physical Exam Vitals and nursing note reviewed.  Constitutional:      General: He is not in acute distress.    Appearance: Normal appearance.  Cardiovascular:     Rate and Rhythm: Normal rate and regular rhythm.     Pulses: Normal pulses.     Heart sounds: Normal heart sounds. No murmur heard. Pulmonary:     Effort: Pulmonary effort is normal. No respiratory distress.     Breath sounds: Normal breath sounds. No wheezing.  Skin:    General: Skin is warm and dry.     Capillary Refill: Capillary refill takes less than 2 seconds.  Neurological:     General: No focal deficit present.     Mental Status: He is alert and oriented to person, place, and time.     Cranial Nerves: No cranial nerve deficit.     Motor: No weakness.       Assessment And Plan:  Bee sting, accidental or unintentional, initial encounter Assessment & Plan: Recent stings with localized pain and inflammation. No systemic allergic reaction. Pain disrupted sleep but subsided. Area warm due to inflammation, no infection. - Apply hydrocortisone cream to affected areas. - Continue famotidine  (Pepcid ) twice daily. - Apply ice packs to reduce swelling and pain. - Report worsening symptoms by Thursday. - Avoid further exposure by addressing wasp nest.   Type 2 diabetes mellitus with stage 3a chronic kidney disease, without long-term current use of insulin (HCC) Assessment & Plan: Type 2 diabetes mellitus with recent A1c of 8. Concern about systemic steroids increasing blood glucose. Topical steroid cream considered safe. - Avoid systemic steroids to prevent blood glucose elevation. - Use  topical hydrocortisone cream as needed.     Return for keep same next.  Patient was given opportunity to ask questions. Patient verbalized understanding of the plan and was able to repeat key elements of the plan. All questions were answered to their satisfaction.    LILLETTE [name], FNP, have reviewed all documentation for this visit. The documentation on 06/25/24 for the exam, diagnosis, procedures, and orders are all accurate and complete.   IF YOU HAVE BEEN REFERRED TO A SPECIALIST, IT MAY TAKE 1-2 WEEKS TO SCHEDULE/PROCESS THE REFERRAL. IF  YOU HAVE NOT HEARD FROM US /SPECIALIST IN TWO WEEKS, PLEASE GIVE US  A CALL AT [phone].

## 2024-06-25 NOTE — Progress Notes (Signed)
   06/25/2024  Patient ID: Jose Ponce, male   DOB: 01/14/1941, 83 y.o.   MRN: [id number]  Patient was called to follow up on blood sugars. Unfortunately, he did not answer the phone. HIPAA compliant message was left on his voicemail.  Plan: Call Patient back in 1 week.   Cassius DOROTHA Brought, PharmD, BCACP Clinical Pharmacist [phone]

## 2024-07-05 ENCOUNTER — Telehealth: Payer: Self-pay | Admitting: Pharmacist

## 2024-07-05 DIAGNOSIS — N1831 Chronic kidney disease, stage 3a: Secondary | ICD-10-CM

## 2024-07-05 NOTE — Progress Notes (Signed)
   07/05/2024  Patient ID: Luster ONEIDA Dixons, male   DOB: [birth year], 83 y.o.   MRN: [id number]  Patient was called regarding medication management. Unfortunately, He did not answer the phone. HIPAA compliant message was left. Most recent A1c was 8%. Upcoming appointment 07/23/24--appointment from 06/1924 was cancelled.  On rosuvastatin 10 mg 1 tablet daily last filled 05/15/24 #90 Telmisartan  20 mg 1 tablet daily last filled 06//26/25 #90  SUPD gap closed.  Will follow up after upcoming appointment. Will need to complete a refill/order change form for the Patient to get refills of Ozempic . Depending on his A1c, the dose may need to increase.  Cassius DOROTHA Brought, PharmD, BCACP Clinical Pharmacist [phone]   Cassius DOROTHA Brought, PharmD, BCACP Clinical Pharmacist [phone]

## 2024-07-09 ENCOUNTER — Ambulatory Visit: Admitting: Internal Medicine

## 2024-07-12 DIAGNOSIS — T63441A Toxic effect of venom of bees, accidental (unintentional), initial encounter: Secondary | ICD-10-CM | POA: Insufficient documentation

## 2024-07-12 NOTE — Assessment & Plan Note (Signed)
 Type 2 diabetes mellitus with recent A1c of 8. Concern about systemic steroids increasing blood glucose. Topical steroid cream considered safe. - Avoid systemic steroids to prevent blood glucose elevation. - Use topical hydrocortisone cream as needed.

## 2024-07-12 NOTE — Assessment & Plan Note (Signed)
 Recent stings with localized pain and inflammation. No systemic allergic reaction. Pain disrupted sleep but subsided. Area warm due to inflammation, no infection. - Apply hydrocortisone cream to affected areas. - Continue famotidine  (Pepcid ) twice daily. - Apply ice packs to reduce swelling and pain. - Report worsening symptoms by Thursday. - Avoid further exposure by addressing wasp nest.

## 2024-07-23 ENCOUNTER — Ambulatory Visit: Admitting: Internal Medicine

## 2024-08-02 ENCOUNTER — Telehealth: Payer: Self-pay

## 2024-08-02 ENCOUNTER — Telehealth: Payer: Self-pay | Admitting: Pharmacist

## 2024-08-02 DIAGNOSIS — N1831 Chronic kidney disease, stage 3a: Secondary | ICD-10-CM

## 2024-08-02 NOTE — Progress Notes (Signed)
   08/02/2024  Patient ID: Luster ONEIDA Dixons, male   DOB: [birth year], 83 y.o.   MRN: [id number]  Patient was called regarding diabetes management. Unfortunately, he did not answer his phone. HIPAA compliant message was left on his voicemail.He has an upcoming appointment on 08/13/24.  Needs new labs.  Last HgA1c- 8%  Rosuvastatin filled 05/16/24 Telmisartan  20 mg filled 05/16/2024  Plan: Send note to [facility] to complete refill form for Ozempic  for Mr. Jost. Once received, I will facilitate getting the form sent back.  Follow up with Patient after his upcoming appointment.   Cassius DOROTHA Brought, PharmD, BCACP Clinical Pharmacist [phone]

## 2024-08-02 NOTE — Telephone Encounter (Signed)
 Faxed completed reorder refill form to Provider's office for Ozempic  ([employer]ovo Nordisk) for review and signature.

## 2024-08-07 ENCOUNTER — Telehealth: Payer: Self-pay | Admitting: Pharmacy Technician

## 2024-08-07 NOTE — Progress Notes (Signed)
   08/07/2024  Patient ID: Luster ONEIDA Dixons, male   DOB: [birth year], 83 y.o.   MRN: [id number]  Patient engaged with clinical pharmacist for management of diabetes on 01/24/2024. Outreach by [employer] technician was requested.   Outreached patient to discuss diabetes medication management. Left voicemail for patient to return my call at their convenience.   Montine Hight, CPhT [facility] Office: [phone] Email: [email or web address]

## 2024-08-08 NOTE — Telephone Encounter (Signed)
 Faxed completed reorder form for Ozempic  to [employer]

## 2024-08-09 ENCOUNTER — Telehealth: Payer: Self-pay | Admitting: Pharmacy Technician

## 2024-08-09 NOTE — Progress Notes (Signed)
   08/09/2024  Patient ID: Jose Ponce, male   DOB: 15-Dec-1940, 83 y.o.   MRN: [id number]  Patient engaged with clinical pharmacist for management of diabetes on 01/24/2024. Outreach by [employer] technician was requested.   Outreached patient to discuss diabetes medication management. Left voicemail for patient to return my call at their convenience.   Shantell Belongia, CPhT [facility] Office: [phone] Email: [email or web address]

## 2024-08-12 ENCOUNTER — Telehealth: Payer: Self-pay | Admitting: Pharmacy Technician

## 2024-08-12 NOTE — Progress Notes (Signed)
   08/12/2024  Patient ID: Jose Ponce, male   DOB: Dec 09, 1940, 83 y.o.   MRN: [id number]  Patient engaged with clinical pharmacist for management of diabetes on 01/24/2024. Outreach by [employer] technician was requested.   Outreached patient to discuss diabetes medication management. Left voicemail for patient to return my call at their convenience. Snet patient MyChart message.  [name], CPhT [facility] Office: [phone] Email: [email or web address]

## 2024-08-13 ENCOUNTER — Encounter: Payer: Self-pay | Admitting: Internal Medicine

## 2024-08-13 ENCOUNTER — Ambulatory Visit: Admitting: Internal Medicine

## 2024-08-13 VITALS — BP 120/60 | HR 73 | Temp 97.6°F | Ht 67.0 in | Wt 160.0 lb

## 2024-08-13 DIAGNOSIS — R6889 Other general symptoms and signs: Secondary | ICD-10-CM | POA: Insufficient documentation

## 2024-08-13 DIAGNOSIS — Z23 Encounter for immunization: Secondary | ICD-10-CM

## 2024-08-13 DIAGNOSIS — I25118 Atherosclerotic heart disease of native coronary artery with other forms of angina pectoris: Secondary | ICD-10-CM | POA: Diagnosis not present

## 2024-08-13 DIAGNOSIS — N1831 Chronic kidney disease, stage 3a: Secondary | ICD-10-CM

## 2024-08-13 DIAGNOSIS — E7800 Pure hypercholesterolemia, unspecified: Secondary | ICD-10-CM

## 2024-08-13 DIAGNOSIS — E1122 Type 2 diabetes mellitus with diabetic chronic kidney disease: Secondary | ICD-10-CM | POA: Diagnosis not present

## 2024-08-13 DIAGNOSIS — I1310 Hypertensive heart and chronic kidney disease without heart failure, with stage 1 through stage 4 chronic kidney disease, or unspecified chronic kidney disease: Secondary | ICD-10-CM | POA: Diagnosis not present

## 2024-08-13 NOTE — Assessment & Plan Note (Signed)
 He is advised that his sx could be due to chronic ASA use. Both CBC and TSH results from earlier this year reviewed. I will recheck labs as below.

## 2024-08-13 NOTE — Assessment & Plan Note (Signed)
 Managing blood pressure and kidney function critical to prevent dialysis. Current medications: metoprolol, telmisartan , rosuvastatin. Potential rosuvastatin dosage adjustment. - Continue metoprolol, telmisartan , and rosuvastatin. - Consider adjusting rosuvastatin dosage if necessary.

## 2024-08-13 NOTE — Progress Notes (Signed)
 I,Jameka J Llittleton, CMA,acting as a Neurosurgeon for Jose LOISE Slocumb, MD.,have documented all relevant documentation on the behalf of Jose LOISE Slocumb, MD,as directed by  Jose LOISE Slocumb, MD while in the presence of Jose LOISE Slocumb, MD.  Subjective:  Patient ID: Jose Ponce , male    DOB: [birth year] , 83 y.o.   MRN: [id number]  Chief Complaint  Patient presents with   Diabetes    Patient presents today for a diabetes check. Patient reports compliance with his meds. Patient denies having chest pain,sob or headaches at this time. Patient is concerned about being cold all the time and he had some questions about his nutrition.     HPI Discussed the use of AI scribe software for clinical note transcription with the patient, who gave verbal consent to proceed.  History of Present Illness [name] is an 83 year old male with diabetes and hypertension who presents for management of his blood sugar and blood pressure.  He regularly monitors his blood sugar levels, with a morning reading of 110 mg/dL. He consumes sparkling juice and ginger ale when he feels hypoglycemic but acknowledges the need to confirm low blood sugar before consuming sugary drinks. He is concerned about his A1c levels, which he believes are not above 8%.  He has a history of open heart surgery and is currently taking aspirin as a blood thinner. He reports feeling cold. No blood in stools and denies constipation. He is not taking Linzess regularly as he does not feel constipated daily.  He is on several medications including allopurinol, aspirin, Farxiga , metoprolol 50 mg daily, rosuvastatin 10 mg, Ozempic  0.25 mg, and telmisartan  20 mg. He struggles with sugary drinks but is committed to following medical advice.  He has started walking in his neighborhood for exercise. He mentions a past referral to an exercise program at the [facility], which he did not attend as he was not contacted for setup. He prefers to attend the program at a  location closer to his home.  He discusses his dietary habits, mentioning that he cooks at home and drinks water regularly. He is aware of the sugar content in his drinks.   Diabetes He presents for his follow-up diabetic visit. He has type 2 diabetes mellitus. His disease course has been stable. There are no hypoglycemic associated symptoms. Pertinent negatives for hypoglycemia include no dizziness or headaches. Pertinent negatives for diabetes include no blurred vision, no polydipsia, no polyphagia and no polyuria. There are no hypoglycemic complications. Diabetic complications include nephropathy. Risk factors for coronary artery disease include diabetes mellitus, dyslipidemia, hypertension and male sex. He is following a diabetic diet. He participates in exercise three times a week. An ACE inhibitor/angiotensin II receptor blocker is being taken.  Hypertension This is a chronic problem. The current episode started more than 1 year ago. The problem has been gradually improving since onset. Pertinent negatives include no blurred vision or headaches. The current treatment provides moderate improvement. Compliance problems include exercise.  Hypertensive end-organ damage includes kidney disease and CAD/MI.     Past Medical History:  Diagnosis Date   Chronic kidney disease    Coronary artery disease    Gout    Hyperlipidemia    Hypertension    Prostate cancer (HCC)    Type 2 diabetes mellitus (HCC)      Family History  Problem Relation Age of Onset   Asthma Mother    Heart disease Father    Stroke Father  Heart attack Father 79   Cancer Sister 1   Diabetes Brother    Heart disease Maternal Grandmother      Current Outpatient Medications:    allopurinol (ZYLOPRIM) 100 MG tablet, TAKE 1/2 TABLET BY MOUTH EVERY DAY, Disp: 45 tablet, Rfl: 1   aspirin 81 MG tablet, Take 81 mg by mouth daily., Disp: , Rfl:    Cholecalciferol 125 MCG (5000 UT) CHEW, Chew 5,000 Units by mouth. , Disp:  , Rfl:    dapagliflozin  propanediol (FARXIGA ) 10 MG TABS tablet, Take 1 tablet (10 mg total) by mouth daily before breakfast., Disp: 30 tablet, Rfl: 2   famotidine  (PEPCID ) 20 MG tablet, Take 1 tablet (20 mg total) by mouth 2 (two) times daily., Disp: 180 tablet, Rfl: 1   Flaxseed, Linseed, (FLAX SEEDS PO), Take 1 tablet by mouth daily., Disp: , Rfl:    linaclotide (LINZESS) 145 MCG CAPS capsule, Take 145 mcg by mouth daily before breakfast., Disp: , Rfl:    metoprolol succinate (TOPROL-XL) 50 MG 24 hr tablet, TAKE 1 TABLET BY MOUTH EVERY DAY, Disp: 90 tablet, Rfl: 1   rosuvastatin (CRESTOR) 10 MG tablet, TAKE 1 TABLET BY MOUTH EVERY DAY, Disp: 90 tablet, Rfl: 1   Semaglutide ,0.25 or 0.5MG /DOS, (OZEMPIC , 0.25 OR 0.5 MG/DOSE,) 2 MG/3ML SOPN, Inject 0.25 mg into the skin once a week., Disp: 6 mL, Rfl: 1   telmisartan  (MICARDIS ) 20 MG tablet, TAKE 1 TABLET(20 MG) BY MOUTH DAILY, Disp: 90 tablet, Rfl: 1   triamcinolone  cream (KENALOG ) 0.1 %, APPLY TO AFFECTED AREA on hand TWICE DAILY AS NEEDED, Disp: 45 g, Rfl: 0  Current Facility-Administered Medications:    cyanocobalamin  ((VITAMIN B-12)) injection 1,000 mcg, 1,000 mcg, Intramuscular, Once, Jarold Medici, MD   No Known Allergies   Review of Systems  Constitutional: Negative.   Eyes: Negative.  Negative for blurred vision.  Endocrine: Negative for polydipsia, polyphagia and polyuria.  Musculoskeletal: Negative.   Skin: Negative.   Neurological:  Negative for dizziness and headaches.  Psychiatric/Behavioral: Negative.       Today's Vitals   08/13/24 1424  BP: 120/60  Pulse: 73  Temp: 97.6 F (36.4 C)  TempSrc: Oral  Weight: 160 lb (72.6 kg)  Height: 5' 7 (1.702 m)   Body mass index is 25.06 kg/m.  Wt Readings from Last 3 Encounters:  08/13/24 160 lb (72.6 kg)  06/25/24 158 lb 9.6 oz (71.9 kg)  04/09/24 164 lb 12.8 oz (74.8 kg)    The ASCVD Risk score (Arnett DK, et al., 2019) failed to calculate for the following reasons:    The 2019 ASCVD risk score is only valid for ages 47 to 72  Objective:  Physical Exam Vitals and nursing note reviewed.  Constitutional:      Appearance: Normal appearance.  HENT:     Head: Normocephalic and atraumatic.  Eyes:     Extraocular Movements: Extraocular movements intact.  Cardiovascular:     Rate and Rhythm: Normal rate and regular rhythm.     Heart sounds: Normal heart sounds.  Pulmonary:     Effort: Pulmonary effort is normal.     Breath sounds: Normal breath sounds.  Musculoskeletal:     Cervical back: Normal range of motion.  Skin:    General: Skin is warm.  Neurological:     General: No focal deficit present.     Mental Status: He is alert.  Psychiatric:        Mood and Affect: Mood normal.  Assessment And Plan:  Type 2 diabetes mellitus with stage 3a chronic kidney disease, without long-term current use of insulin (HCC) Assessment & Plan: Blood sugar levels 110-121 mg/dL, accuracy uncertain. A1c concern if above 8%. - Provide Dexcom and blood glucose meter for home monitoring. - Advise limiting sugary drinks to half a container and diluting with water. - Recommend using Crystal Light Pure and Fit as an alternative to sugary drinks.  Orders: -     CMP14+EGFR -     Hemoglobin A1c -     Amb Referral To Provider Referral Exercise Program (P.R.E.P)  Benign hypertensive heart and renal disease Assessment & Plan: Managing blood pressure and kidney function critical to prevent dialysis. Current medications: metoprolol, telmisartan , rosuvastatin. Potential rosuvastatin dosage adjustment. - Continue metoprolol, telmisartan , and rosuvastatin. - Consider adjusting rosuvastatin dosage if necessary.  Orders: -     Amb Referral To Provider Referral Exercise Program (P.R.E.P)  Coronary artery disease involving native coronary artery of native heart with other form of angina pectoris Assessment & Plan: Chronic, he is s/p CABG in 2005.  HE is currently on  aspirin therapy. Discussed potential cold sensation due to aspirin and past open heart surgery. - LDL goal is less than 70. Encouraged to continue with ASA, B-blocker and statin therapy. Cardiology input is appreciated.   Orders: -     Amb Referral To Provider Referral Exercise Program (P.R.E.P)  Pure hypercholesterolemia -     Amb Referral To Provider Referral Exercise Program (P.R.E.P)  Cold intolerance Assessment & Plan: He is advised that his sx could be due to chronic ASA use. Both CBC and TSH results from earlier this year reviewed. I will recheck labs as below.   Orders: -     CBC  Need for influenza vaccination -     Flu vaccine HIGH DOSE PF(Fluzone Trivalent)   Return for controlled DM check-4 months.  Patient was given opportunity to ask questions. Patient verbalized understanding of the plan and was able to repeat key elements of the plan. All questions were answered to their satisfaction.    I, Jose LOISE Slocumb, MD, have reviewed all documentation for this visit. The documentation on 08/13/24 for the exam, diagnosis, procedures, and orders are all accurate and complete.   IF YOU HAVE BEEN REFERRED TO A SPECIALIST, IT MAY TAKE 1-2 WEEKS TO SCHEDULE/PROCESS THE REFERRAL. IF YOU HAVE NOT HEARD FROM US /SPECIALIST IN TWO WEEKS, PLEASE GIVE US  A CALL AT [phone] X [phone].

## 2024-08-13 NOTE — Patient Instructions (Signed)

## 2024-08-13 NOTE — Assessment & Plan Note (Signed)
 Chronic, he is s/p CABG in 2005.  HE is currently on aspirin therapy. Discussed potential cold sensation due to aspirin and past open heart surgery. - LDL goal is less than 70. Encouraged to continue with ASA, B-blocker and statin therapy. Cardiology input is appreciated.

## 2024-08-13 NOTE — Assessment & Plan Note (Signed)
 Blood sugar levels 110-121 mg/dL, accuracy uncertain. A1c concern if above 8%. - Provide Dexcom and blood glucose meter for home monitoring. - Advise limiting sugary drinks to half a container and diluting with water. - Recommend using Crystal Light Pure and Fit as an alternative to sugary drinks.

## 2024-08-14 LAB — HEMOGLOBIN A1C
Est. average glucose Bld gHb Est-mCnc: 166 mg/dL
Hgb A1c MFr Bld: 7.4 % — ABNORMAL HIGH (ref 4.8–5.6)

## 2024-08-14 LAB — CMP14+EGFR
ALT: 20 IU/L (ref 0–44)
AST: 21 IU/L (ref 0–40)
Albumin: 4.2 g/dL (ref 3.7–4.7)
Alkaline Phosphatase: 76 IU/L (ref 48–129)
BUN/Creatinine Ratio: 11 (ref 10–24)
BUN: 15 mg/dL (ref 8–27)
Bilirubin Total: 0.4 mg/dL (ref 0.0–1.2)
CO2: 20 mmol/L (ref 20–29)
Calcium: 9.2 mg/dL (ref 8.6–10.2)
Chloride: 106 mmol/L (ref 96–106)
Creatinine, Ser: 1.38 mg/dL — ABNORMAL HIGH (ref 0.76–1.27)
Globulin, Total: 3.1 g/dL (ref 1.5–4.5)
Glucose: 144 mg/dL — ABNORMAL HIGH (ref 70–99)
Potassium: 4.6 mmol/L (ref 3.5–5.2)
Sodium: 141 mmol/L (ref 134–144)
Total Protein: 7.3 g/dL (ref 6.0–8.5)
eGFR: 51 mL/min/1.73 — ABNORMAL LOW (ref >59)

## 2024-08-14 LAB — CBC
Hematocrit: 47.3 % (ref 37.5–51.0)
Hemoglobin: 15.8 g/dL (ref 13.0–17.7)
MCH: 32.2 pg (ref 26.6–33.0)
MCHC: 33.4 g/dL (ref 31.5–35.7)
MCV: 96 fL (ref 79–97)
Platelets: 207 x10E3/uL (ref 150–450)
RBC: 4.91 x10E6/uL (ref 4.14–5.80)
RDW: 13.1 % (ref 11.6–15.4)
WBC: 5 x10E3/uL (ref 3.4–10.8)

## 2024-08-17 ENCOUNTER — Ambulatory Visit: Payer: Self-pay | Admitting: Internal Medicine

## 2024-08-20 ENCOUNTER — Telehealth: Payer: Self-pay

## 2024-08-20 NOTE — Telephone Encounter (Signed)
 Left Jose Ponce a message about the PREP Program and asked him to return my call.

## 2024-08-28 ENCOUNTER — Telehealth: Payer: Self-pay

## 2024-08-28 NOTE — Telephone Encounter (Signed)
 PAP: Patient assistance application for Farxiga  through [employer] ([employer]) has been mailed to pt's home address on file. Provider portion of application will be faxed to provider's office.For renewal 2026.

## 2024-09-05 ENCOUNTER — Other Ambulatory Visit (HOSPITAL_COMMUNITY): Payer: Self-pay

## 2024-09-05 NOTE — Telephone Encounter (Signed)
 Received provider portion PAP application [employer] for Farxiga .

## 2024-09-11 ENCOUNTER — Telehealth: Payer: Self-pay

## 2024-09-11 NOTE — Telephone Encounter (Signed)
 Left message about PREP program and asked him to return my call.

## 2024-09-16 ENCOUNTER — Telehealth: Payer: Self-pay | Admitting: Pharmacist

## 2024-09-16 DIAGNOSIS — N1831 Chronic kidney disease, stage 3a: Secondary | ICD-10-CM

## 2024-09-16 NOTE — Progress Notes (Addendum)
   09/16/2024  Patient ID: Jose Ponce, male   DOB: December 11, 1940, 83 y.o.   MRN: [id number]  Called Patient was called to follow up on diabetes management and patient assistance for 2026.  Unfortunately, he did not answer the phone. HIPAA compliant message was left on his voicemail.  A renewal application has been placed for Farxiga . However, Ozempic  is no longer available through [employer]'s patient assistance program.    Lab Results  Component Value Date   HGBA1C 7.4 (H) 08/13/2024   HGBA1C 8.0 (H) 03/05/2024   HGBA1C 8.0 (H) 10/12/2023     Plan: Will send note over to Med RX Assistance team for a refill/reorder form to be completed for Ozempic  for 2025. Will call patient back in 2-3 weeks.  Cassius DOROTHA Brought, PharmD, BCACP Clinical Pharmacist [phone]  ADDENDUM  Patient called me back. HIPAA identifiers were obtained.  It was explained to the Patient that Ozempic  will not be offered through [employer]'s Patient assistance program fro 2026 and that his Farxiga  renewal is in the process.  Patient said was afraid to handle the verification process for [employer] and said he would come by the office for assistance.   Cassius DOROTHA Brought, PharmD, BCACP Clinical Pharmacist [phone]   ADDENDUM Received provider portion.  Signature obtained. Faxed back to Luke Mall, CPhT for processing and scanning.  Cassius DOROTHA Brought, PharmD, BCACP Clinical Pharmacist [phone]

## 2024-09-19 ENCOUNTER — Other Ambulatory Visit (HOSPITAL_COMMUNITY): Payer: Self-pay

## 2024-10-01 ENCOUNTER — Encounter: Payer: Self-pay | Admitting: Pharmacist

## 2024-10-01 NOTE — Progress Notes (Signed)
   10/01/2024  Patient ID: Jose Ponce, male   DOB: 05-04-1941, 83 y.o.   MRN: [id number]  Pharmacy Quality Measure Review  This patient is appearing on a report for being at risk of failing the adherence measure for hypertension (ACEi/ARB) medications this calendar year.   Medication: Telmisartan   Last fill date: 05/16/24 for 90 day supply  Called Patient . Unfortunately, he did not answer the phone and a voicemail did not pick up.  Call Pharmacy. They said the Patient had one more refill left on Telmisartan .  They will get it ready and the Patient can go buy and pick it up.  Cassius DOROTHA Brought, PharmD, BCACP Clinical Pharmacist [phone]

## 2024-10-09 NOTE — Progress Notes (Unsigned)
   10/09/2024 Name: Jose Ponce MRN: [id number] DOB: Nov 18, 1941  Chief Complaint  Patient presents with   Medication Assistance    Farxiga     Jose Ponce is a 83 y.o. year old male who was referred for medication management by their primary care provider, Jarold Medici, MD. They presented for a face to face visit today.To bring in Patient Assistance forms.    They were referred to the pharmacist by their PCP for assistance in managing medication access : cornoary artery disease, hyertension, hyperlipidemia,    Subjective: [name] is an 83 year old male with multiple medical conditions including but not limited to:   Care Team: Primary Care Provider: Jarold Medici, MD ;   Medication Access/Adherence  Current Pharmacy:  [facility] [phone] - [place], [place] - [address] AT [facility] [address] [place] [address] Phone: [phone] Fax: [phone]   Patient reports affordability concerns with their medications: {YES/NO:21197} Patient reports access/transportation concerns to their pharmacy: {YES/NO:21197} Patient reports adherence concerns with their medications:  {YES/NO:21197} ***   {Pharmacy S/O Choices:26420}   Objective:  Lab Results  Component Value Date   HGBA1C 7.4 (H) 08/13/2024    Lab Results  Component Value Date   CREATININE 1.38 (H) 08/13/2024   BUN 15 08/13/2024   NA 141 08/13/2024   K 4.6 08/13/2024   CL 106 08/13/2024   CO2 20 08/13/2024    Lab Results  Component Value Date   CHOL 138 03/05/2024   HDL 49 03/05/2024   LDLCALC 64 03/05/2024   TRIG 147 03/05/2024   CHOLHDL 2.8 03/05/2024    Medications Reviewed Today   Medications were not reviewed in this encounter       Assessment/Plan:   {Pharmacy A/P Choices:26421}  Follow Up Plan: ***  ***

## 2024-10-10 ENCOUNTER — Telehealth: Payer: Self-pay | Admitting: Pharmacist

## 2024-10-10 DIAGNOSIS — E1122 Type 2 diabetes mellitus with diabetic chronic kidney disease: Secondary | ICD-10-CM

## 2024-10-10 NOTE — Telephone Encounter (Signed)
   10/10/2024 Name: Jose Ponce MRN: [id number] DOB: 1941/01/01  Patient brought his Patient Assistance paperwork and a letter from [employer] to the office for review.  Per the letter from [employer], he is over income for their program.  The Patient's household income was reviewed and he is in fact, over income.  Their determination is correct.    Patient communicated understanding.  He will need to have a prescription for both Ozempic  and Farxiga  sent to his local pharmacy when he needs these medications in 2026.  Lab Results  Component Value Date   HGBA1C 7.4 (H) 08/13/2024   HGBA1C 8.0 (H) 03/05/2024   HGBA1C 8.0 (H) 10/12/2023     Call Patient back in January to check on his supply.    Cassius DOROTHA Brought, PharmD, BCACP Clinical Pharmacist [phone] '

## 2024-11-06 ENCOUNTER — Other Ambulatory Visit: Payer: Self-pay | Admitting: Internal Medicine

## 2024-11-13 ENCOUNTER — Other Ambulatory Visit: Payer: Self-pay | Admitting: Internal Medicine

## 2024-12-05 ENCOUNTER — Other Ambulatory Visit: Payer: Self-pay | Admitting: Internal Medicine

## 2024-12-17 ENCOUNTER — Ambulatory Visit: Admitting: Internal Medicine

## 2024-12-31 ENCOUNTER — Ambulatory Visit: Payer: Self-pay | Admitting: Internal Medicine

## 2025-02-11 ENCOUNTER — Ambulatory Visit: Admitting: Internal Medicine

## 2025-03-05 ENCOUNTER — Ambulatory Visit: Payer: Self-pay

## 2025-04-17 ENCOUNTER — Encounter: Payer: Self-pay | Admitting: Internal Medicine
